# Patient Record
Sex: Female | Born: 1945 | Race: White | Hispanic: No | Marital: Married | Smoking: Never smoker
Health system: Southern US, Community
[De-identification: ages and names within clinical notes are randomized; demographics above are authoritative.]

## PROBLEM LIST (undated history)

## (undated) DIAGNOSIS — K5792 Diverticulitis of intestine, part unspecified, without perforation or abscess without bleeding: Secondary | ICD-10-CM

## (undated) DIAGNOSIS — I10 Essential (primary) hypertension: Secondary | ICD-10-CM

## (undated) DIAGNOSIS — B029 Zoster without complications: Secondary | ICD-10-CM

## (undated) DIAGNOSIS — M1990 Unspecified osteoarthritis, unspecified site: Secondary | ICD-10-CM

## (undated) DIAGNOSIS — H269 Unspecified cataract: Secondary | ICD-10-CM

## (undated) DIAGNOSIS — K5730 Diverticulosis of large intestine without perforation or abscess without bleeding: Secondary | ICD-10-CM

## (undated) DIAGNOSIS — E119 Type 2 diabetes mellitus without complications: Secondary | ICD-10-CM

## (undated) DIAGNOSIS — C801 Malignant (primary) neoplasm, unspecified: Secondary | ICD-10-CM

## (undated) DIAGNOSIS — E785 Hyperlipidemia, unspecified: Secondary | ICD-10-CM

## (undated) DIAGNOSIS — K219 Gastro-esophageal reflux disease without esophagitis: Secondary | ICD-10-CM

## (undated) HISTORY — PX: ABDOMINAL HYSTERECTOMY: SHX81

## (undated) HISTORY — DX: Unspecified osteoarthritis, unspecified site: M19.90

## (undated) HISTORY — DX: Gastro-esophageal reflux disease without esophagitis: K21.9

## (undated) HISTORY — DX: Diverticulosis of large intestine without perforation or abscess without bleeding: K57.30

## (undated) HISTORY — DX: Zoster without complications: B02.9

## (undated) HISTORY — DX: Essential (primary) hypertension: I10

## (undated) HISTORY — PX: POLYPECTOMY: SHX149

## (undated) HISTORY — DX: Type 2 diabetes mellitus without complications: E11.9

## (undated) HISTORY — DX: Unspecified cataract: H26.9

## (undated) HISTORY — PX: COLONOSCOPY: SHX174

## (undated) HISTORY — PX: HEMATOMA EVACUATION: SHX5118

## (undated) HISTORY — DX: Diverticulitis of intestine, part unspecified, without perforation or abscess without bleeding: K57.92

## (undated) HISTORY — DX: Hyperlipidemia, unspecified: E78.5

## (undated) HISTORY — PX: TUBAL LIGATION: SHX77

---

## 2001-03-31 ENCOUNTER — Encounter: Payer: Self-pay | Admitting: Emergency Medicine

## 2001-03-31 ENCOUNTER — Encounter (INDEPENDENT_AMBULATORY_CARE_PROVIDER_SITE_OTHER): Payer: Self-pay

## 2001-03-31 ENCOUNTER — Inpatient Hospital Stay (HOSPITAL_COMMUNITY): Admission: EM | Admit: 2001-03-31 | Discharge: 2001-04-07 | Payer: Self-pay | Admitting: Emergency Medicine

## 2001-04-04 ENCOUNTER — Encounter: Payer: Self-pay | Admitting: General Surgery

## 2001-04-10 ENCOUNTER — Emergency Department (HOSPITAL_COMMUNITY): Admission: EM | Admit: 2001-04-10 | Discharge: 2001-04-11 | Payer: Self-pay | Admitting: Emergency Medicine

## 2001-04-11 ENCOUNTER — Encounter: Payer: Self-pay | Admitting: General Surgery

## 2001-04-11 ENCOUNTER — Ambulatory Visit (HOSPITAL_COMMUNITY): Admission: RE | Admit: 2001-04-11 | Discharge: 2001-04-11 | Payer: Self-pay | Admitting: General Surgery

## 2004-09-15 ENCOUNTER — Ambulatory Visit: Payer: Self-pay | Admitting: Internal Medicine

## 2004-09-18 ENCOUNTER — Ambulatory Visit: Payer: Self-pay | Admitting: Internal Medicine

## 2004-09-27 ENCOUNTER — Ambulatory Visit (HOSPITAL_COMMUNITY): Admission: RE | Admit: 2004-09-27 | Discharge: 2004-09-27 | Payer: Self-pay | Admitting: Internal Medicine

## 2004-10-27 ENCOUNTER — Ambulatory Visit: Payer: Self-pay | Admitting: Internal Medicine

## 2004-10-30 ENCOUNTER — Ambulatory Visit: Payer: Self-pay | Admitting: Internal Medicine

## 2005-01-02 ENCOUNTER — Ambulatory Visit: Payer: Self-pay | Admitting: Internal Medicine

## 2005-05-01 ENCOUNTER — Ambulatory Visit: Payer: Self-pay | Admitting: Gastroenterology

## 2005-05-17 ENCOUNTER — Ambulatory Visit: Payer: Self-pay | Admitting: Internal Medicine

## 2005-05-22 ENCOUNTER — Ambulatory Visit: Payer: Self-pay | Admitting: Internal Medicine

## 2005-06-13 ENCOUNTER — Ambulatory Visit: Payer: Self-pay | Admitting: Gastroenterology

## 2005-07-09 ENCOUNTER — Ambulatory Visit: Payer: Self-pay | Admitting: Internal Medicine

## 2005-07-10 ENCOUNTER — Ambulatory Visit: Payer: Self-pay | Admitting: Internal Medicine

## 2005-07-12 ENCOUNTER — Ambulatory Visit: Payer: Self-pay | Admitting: Gastroenterology

## 2005-07-18 ENCOUNTER — Ambulatory Visit: Payer: Self-pay | Admitting: Internal Medicine

## 2006-04-10 ENCOUNTER — Ambulatory Visit: Payer: Self-pay | Admitting: Internal Medicine

## 2006-04-10 LAB — CONVERTED CEMR LAB
ALT: 17 units/L (ref 0–40)
Albumin: 3.9 g/dL (ref 3.5–5.2)
Alkaline Phosphatase: 47 units/L (ref 39–117)
BUN: 23 mg/dL (ref 6–23)
Basophils Relative: 3.4 % — ABNORMAL HIGH (ref 0.0–1.0)
Bilirubin Urine: NEGATIVE
CO2: 26 meq/L (ref 19–32)
Calcium: 9.3 mg/dL (ref 8.4–10.5)
Eosinophils Absolute: 0.2 10*3/uL (ref 0.0–0.6)
GFR calc Af Amer: 208 mL/min
GFR calc non Af Amer: 172 mL/min
LDL Cholesterol: 67 mg/dL (ref 0–99)
Lymphocytes Relative: 28.2 % (ref 12.0–46.0)
Monocytes Relative: 5.2 % (ref 3.0–11.0)
Neutro Abs: 4.4 10*3/uL (ref 1.4–7.7)
Platelets: 317 10*3/uL (ref 150–400)
Specific Gravity, Urine: 1.01 (ref 1.000–1.03)
Total CHOL/HDL Ratio: 3
Total Protein, Urine: NEGATIVE mg/dL
Triglycerides: 118 mg/dL (ref 0–149)
Urine Glucose: NEGATIVE mg/dL
VLDL: 24 mg/dL (ref 0–40)

## 2006-04-16 ENCOUNTER — Ambulatory Visit: Payer: Self-pay | Admitting: Internal Medicine

## 2007-06-25 ENCOUNTER — Emergency Department (HOSPITAL_COMMUNITY): Admission: EM | Admit: 2007-06-25 | Discharge: 2007-06-26 | Payer: Self-pay | Admitting: Emergency Medicine

## 2007-07-23 ENCOUNTER — Telehealth (INDEPENDENT_AMBULATORY_CARE_PROVIDER_SITE_OTHER): Payer: Self-pay | Admitting: *Deleted

## 2007-07-30 ENCOUNTER — Ambulatory Visit (HOSPITAL_COMMUNITY): Admission: RE | Admit: 2007-07-30 | Discharge: 2007-07-30 | Payer: Self-pay | Admitting: Internal Medicine

## 2007-09-05 ENCOUNTER — Ambulatory Visit: Payer: Self-pay | Admitting: Internal Medicine

## 2007-09-05 LAB — CONVERTED CEMR LAB
ALT: 20 units/L (ref 0–35)
Basophils Absolute: 0.1 10*3/uL (ref 0.0–0.1)
Bilirubin Urine: NEGATIVE
Bilirubin, Direct: 0.2 mg/dL (ref 0.0–0.3)
CO2: 26 meq/L (ref 19–32)
Calcium: 9.4 mg/dL (ref 8.4–10.5)
Creatinine, Ser: 0.9 mg/dL (ref 0.4–1.2)
Eosinophils Absolute: 0.2 10*3/uL (ref 0.0–0.7)
GFR calc Af Amer: 81 mL/min
GFR calc non Af Amer: 67 mL/min
HDL: 42.4 mg/dL (ref >39.0)
Ketones, ur: NEGATIVE mg/dL
LDL Cholesterol: 67 mg/dL (ref 0–99)
Lymphocytes Relative: 27 % (ref 12.0–46.0)
MCHC: 35.4 g/dL (ref 30.0–36.0)
MCV: 90.7 fL (ref 78.0–100.0)
Neutro Abs: 5.4 10*3/uL (ref 1.4–7.7)
Neutrophils Relative %: 61.8 % (ref 43.0–77.0)
RDW: 12.2 % (ref 11.5–14.6)
Rh Type: POSITIVE
Sodium: 139 meq/L (ref 135–145)
TSH: 3.68 microintl units/mL (ref 0.35–5.50)
Total Bilirubin: 1.4 mg/dL — ABNORMAL HIGH (ref 0.3–1.2)
Total CHOL/HDL Ratio: 3.3
Triglycerides: 152 mg/dL — ABNORMAL HIGH (ref 0–149)
Urobilinogen, UA: 0.2 (ref 0.0–1.0)
VLDL: 30 mg/dL (ref 0–40)

## 2007-09-10 ENCOUNTER — Ambulatory Visit: Payer: Self-pay | Admitting: Internal Medicine

## 2007-09-10 DIAGNOSIS — E785 Hyperlipidemia, unspecified: Secondary | ICD-10-CM

## 2007-09-10 DIAGNOSIS — E739 Lactose intolerance, unspecified: Secondary | ICD-10-CM | POA: Insufficient documentation

## 2007-09-10 DIAGNOSIS — I10 Essential (primary) hypertension: Secondary | ICD-10-CM | POA: Insufficient documentation

## 2007-09-10 DIAGNOSIS — K5730 Diverticulosis of large intestine without perforation or abscess without bleeding: Secondary | ICD-10-CM

## 2007-09-10 DIAGNOSIS — R040 Epistaxis: Secondary | ICD-10-CM | POA: Insufficient documentation

## 2007-09-10 HISTORY — DX: Diverticulosis of large intestine without perforation or abscess without bleeding: K57.30

## 2007-09-10 HISTORY — DX: Essential (primary) hypertension: I10

## 2007-09-10 HISTORY — DX: Hyperlipidemia, unspecified: E78.5

## 2008-03-26 ENCOUNTER — Telehealth (INDEPENDENT_AMBULATORY_CARE_PROVIDER_SITE_OTHER): Payer: Self-pay | Admitting: *Deleted

## 2008-03-26 DIAGNOSIS — E119 Type 2 diabetes mellitus without complications: Secondary | ICD-10-CM | POA: Insufficient documentation

## 2008-03-26 HISTORY — DX: Type 2 diabetes mellitus without complications: E11.9

## 2008-04-02 ENCOUNTER — Telehealth (INDEPENDENT_AMBULATORY_CARE_PROVIDER_SITE_OTHER): Payer: Self-pay | Admitting: *Deleted

## 2008-04-09 ENCOUNTER — Ambulatory Visit: Payer: Self-pay | Admitting: Internal Medicine

## 2008-04-09 LAB — CONVERTED CEMR LAB
BUN: 26 mg/dL — ABNORMAL HIGH (ref 6–23)
CO2: 28 meq/L (ref 19–32)
Chloride: 106 meq/L (ref 96–112)
Cholesterol: 122 mg/dL (ref 0–200)
GFR calc Af Amer: 93 mL/min
Glucose, Bld: 114 mg/dL — ABNORMAL HIGH (ref 70–99)
HDL: 33 mg/dL — ABNORMAL LOW (ref >39.0)
Potassium: 4.9 meq/L (ref 3.5–5.1)
Sodium: 141 meq/L (ref 135–145)
Triglycerides: 100 mg/dL (ref 0–149)
aPTT: 25 s (ref 21.7–29.8)

## 2008-04-12 ENCOUNTER — Ambulatory Visit: Payer: Self-pay | Admitting: Endocrinology

## 2008-04-12 DIAGNOSIS — R05 Cough: Secondary | ICD-10-CM

## 2008-04-12 DIAGNOSIS — R059 Cough, unspecified: Secondary | ICD-10-CM | POA: Insufficient documentation

## 2008-05-18 ENCOUNTER — Encounter: Admission: RE | Admit: 2008-05-18 | Discharge: 2008-05-18 | Payer: Self-pay | Admitting: Endocrinology

## 2008-05-18 ENCOUNTER — Encounter: Payer: Self-pay | Admitting: Endocrinology

## 2008-10-27 ENCOUNTER — Ambulatory Visit (HOSPITAL_COMMUNITY): Admission: RE | Admit: 2008-10-27 | Discharge: 2008-10-27 | Payer: Self-pay | Admitting: Internal Medicine

## 2008-11-26 ENCOUNTER — Ambulatory Visit: Payer: Self-pay | Admitting: Internal Medicine

## 2008-11-26 LAB — CONVERTED CEMR LAB
AST: 26 units/L (ref 0–37)
Albumin: 4 g/dL (ref 3.5–5.2)
Alkaline Phosphatase: 48 units/L (ref 39–117)
Basophils Relative: 0.3 % (ref 0.0–3.0)
CO2: 27 meq/L (ref 19–32)
Calcium: 9.1 mg/dL (ref 8.4–10.5)
Eosinophils Absolute: 0.2 10*3/uL (ref 0.0–0.7)
Glucose, Bld: 124 mg/dL — ABNORMAL HIGH (ref 70–99)
HCT: 39.9 % (ref 36.0–46.0)
Hemoglobin: 13.7 g/dL (ref 12.0–15.0)
Lymphocytes Relative: 31.1 % (ref 12.0–46.0)
Lymphs Abs: 2.1 10*3/uL (ref 0.7–4.0)
MCHC: 34.4 g/dL (ref 30.0–36.0)
Monocytes Relative: 8.9 % (ref 3.0–12.0)
Neutro Abs: 3.8 10*3/uL (ref 1.4–7.7)
Nitrite: NEGATIVE
Potassium: 4.6 meq/L (ref 3.5–5.1)
RBC: 4.24 M/uL (ref 3.87–5.11)
RDW: 11.9 % (ref 11.5–14.6)
Sodium: 140 meq/L (ref 135–145)
Specific Gravity, Urine: 1.015 (ref 1.000–1.030)
TSH: 2.74 microintl units/mL (ref 0.35–5.50)
Total CHOL/HDL Ratio: 4
Total Protein, Urine: NEGATIVE mg/dL
Total Protein: 6.6 g/dL (ref 6.0–8.3)
pH: 6 (ref 5.0–8.0)

## 2008-11-29 LAB — CONVERTED CEMR LAB: Hgb A1c MFr Bld: 5.9 % (ref 4.6–6.5)

## 2008-12-02 ENCOUNTER — Ambulatory Visit: Payer: Self-pay | Admitting: Internal Medicine

## 2008-12-02 DIAGNOSIS — N959 Unspecified menopausal and perimenopausal disorder: Secondary | ICD-10-CM | POA: Insufficient documentation

## 2009-06-09 ENCOUNTER — Telehealth (INDEPENDENT_AMBULATORY_CARE_PROVIDER_SITE_OTHER): Payer: Self-pay | Admitting: *Deleted

## 2009-06-14 ENCOUNTER — Telehealth: Payer: Self-pay | Admitting: Internal Medicine

## 2009-06-20 ENCOUNTER — Telehealth (INDEPENDENT_AMBULATORY_CARE_PROVIDER_SITE_OTHER): Payer: Self-pay | Admitting: *Deleted

## 2009-07-19 ENCOUNTER — Telehealth (INDEPENDENT_AMBULATORY_CARE_PROVIDER_SITE_OTHER): Payer: Self-pay | Admitting: *Deleted

## 2009-09-02 ENCOUNTER — Telehealth (INDEPENDENT_AMBULATORY_CARE_PROVIDER_SITE_OTHER): Payer: Self-pay | Admitting: *Deleted

## 2010-02-02 ENCOUNTER — Ambulatory Visit: Payer: Self-pay | Admitting: Internal Medicine

## 2010-02-02 DIAGNOSIS — E559 Vitamin D deficiency, unspecified: Secondary | ICD-10-CM

## 2010-02-07 ENCOUNTER — Ambulatory Visit (HOSPITAL_COMMUNITY)
Admission: RE | Admit: 2010-02-07 | Discharge: 2010-02-07 | Payer: Self-pay | Source: Home / Self Care | Attending: Internal Medicine | Admitting: Internal Medicine

## 2010-02-07 LAB — CONVERTED CEMR LAB
ALT: 32 units/L (ref 0–35)
BUN: 24 mg/dL — ABNORMAL HIGH (ref 6–23)
Basophils Absolute: 0.1 10*3/uL (ref 0.0–0.1)
Bilirubin Urine: NEGATIVE
Bilirubin, Direct: 0.2 mg/dL (ref 0.0–0.3)
Cholesterol: 244 mg/dL — ABNORMAL HIGH (ref 0–200)
Creatinine, Ser: 0.7 mg/dL (ref 0.4–1.2)
Direct LDL: 164.5 mg/dL
Eosinophils Relative: 2.4 % (ref 0.0–5.0)
GFR calc non Af Amer: 93.54 mL/min (ref >60.00)
Glucose, Bld: 124 mg/dL — ABNORMAL HIGH (ref 70–99)
HDL: 45.1 mg/dL (ref >39.00)
MCV: 91.9 fL (ref 78.0–100.0)
Monocytes Absolute: 0.5 10*3/uL (ref 0.1–1.0)
Monocytes Relative: 6.6 % (ref 3.0–12.0)
Neutrophils Relative %: 58.6 % (ref 43.0–77.0)
Nitrite: NEGATIVE
Platelets: 284 10*3/uL (ref 150.0–400.0)
RDW: 12.9 % (ref 11.5–14.6)
Specific Gravity, Urine: 1.01 (ref 1.000–1.030)
TSH: 3.54 microintl units/mL (ref 0.35–5.50)
Total Bilirubin: 1.3 mg/dL — ABNORMAL HIGH (ref 0.3–1.2)
Total Protein, Urine: NEGATIVE mg/dL
VLDL: 44.4 mg/dL — ABNORMAL HIGH (ref 0.0–40.0)
WBC: 8.2 10*3/uL (ref 4.5–10.5)
pH: 6 (ref 5.0–8.0)

## 2010-02-09 ENCOUNTER — Ambulatory Visit
Admission: RE | Admit: 2010-02-09 | Discharge: 2010-02-09 | Payer: Self-pay | Source: Home / Self Care | Attending: Internal Medicine | Admitting: Internal Medicine

## 2010-02-09 ENCOUNTER — Encounter: Payer: Self-pay | Admitting: Internal Medicine

## 2010-02-09 DIAGNOSIS — D751 Secondary polycythemia: Secondary | ICD-10-CM | POA: Insufficient documentation

## 2010-02-20 ENCOUNTER — Ambulatory Visit: Admit: 2010-02-20 | Payer: Self-pay | Attending: Internal Medicine | Admitting: Internal Medicine

## 2010-02-26 ENCOUNTER — Encounter: Payer: Self-pay | Admitting: Internal Medicine

## 2010-03-07 NOTE — Progress Notes (Signed)
  Phone Note Other Incoming   Request: Send information Summary of Call: Request for records received from [facility]. Request forwarded to [facility].     

## 2010-03-07 NOTE — Progress Notes (Signed)
  Phone Note Other Incoming   Request: Send information Summary of Call: Request for records received from [facility]. Request forwarded to [facility].     

## 2010-03-07 NOTE — Progress Notes (Signed)
Summary: METFORMIN  Phone Note Call from Patient Call back at [phone] OK VM    Summary of Call: At last office visit pt's metformin was changed from 2 daily to once daily. She has been taking 1 once daily and fasting cbgs are beginning to be 140's. She would like to resume metformin 500mg  1 two times a day. If ok please sent new rx into [facility] [address].  Initial call taken by: Lamar Sprinkles, CMA,  Jun 14, 2009 11:45 AM  Follow-up for Phone Call        ok with me - to robin to handle Follow-up by: Corwin Levins MD,  Jun 14, 2009 1:09 PM    New/Updated Medications: METFORMIN HCL 500 MG TABS (METFORMIN HCL) 1 by mouth two times a day Prescriptions: METFORMIN HCL 500 MG TABS (METFORMIN HCL) 1 by mouth two times a day  #60 x 6   Entered by:   Scharlene Gloss   Authorized by:   Corwin Levins MD   Signed by:   Scharlene Gloss on 06/14/2009   Method used:   Faxed to ...       [facility]  [address]  [phone]* (retail)       [address]       [place], [place]  [address]       Ph: [phone] or [phone]       Fax: [phone]   RxID:   [id number]

## 2010-03-07 NOTE — Progress Notes (Signed)
  Phone Note Other Incoming   Request: Send information Summary of Call: Request for records received from [facility]. Request forwarded to [facility].

## 2010-03-07 NOTE — Progress Notes (Signed)
  Phone Note Other Incoming   Request: Send information Summary of Call: Request received from [facility] forwarded to [facility].        

## 2010-03-09 NOTE — Assessment & Plan Note (Signed)
Summary: Cpx/#/[employer]/cd   Vital Signs:  Patient profile:   65 year old female Height:      67 inches (170.18 cm) Weight:      214.0 pounds (97.27 kg) BMI:     33.64 O2 Sat:      97 % on Room air Temp:     99.0 degrees F (37.22 degrees C) oral Pulse rate:   72 / minute BP sitting:   132 / 88  (left arm) Cuff size:   large  Vitals Entered By: Orlan Leavens RMA (February 09, 2010 2:07 PM)  O2 Flow:  Room air  Preventive Care Screening  Mammogram:    Date:  02/07/2010    Next Due:  02/2011    Results:  normal   Last Flu Shot:    Date:  12/06/2009    Results:  Historical   CC: CPX Is Patient Diabetic? No Pain Assessment Patient in pain? no      Comments Pt stop taking simvastatin a month ago, she states want to discuss giving her muscle cramps   Primary Care Provider:  Corwin Levins MD  CC:  CPX.  History of Present Illness: here for wellness, overall doing ok, Pt denies CP, worsening sob, doe, wheezing, orthopnea, pnd, worsening LE edema, palps, dizziness or syncope  Pt denies new neuro symptoms such as headache, facial or extremity weakness  Pt denies polydipsia, polyuria, or low sugar symptoms such as shakiness improved with eating.  Overall good compliance with meds, trying to follow low chol, DM diet, wt stable, little excercise however  CBG's in the 100's.  Denies worsening depressive symptoms, suicidal ideation, or panic.   No fever, wt loss, night sweats, loss of appetite or other constitutional symptoms  Overall good compliance with meds, and good tolerability.  Pt states good ability with ADL's, low fall risk, home safety reviewed and adequate, no significant change in hearing or vision, trying to follow lower chol diet, and occasionally active only with regular excercise. Did mention she stopped the simvastatin de to hip and knee pain approx 6 wks ago, but now thinks it may not have been due to the statin.  Now starting to go to the gym  - just started , no wt loss yet.     Preventive Screening-Counseling & Management      Drug Use:  no.    Problems Prior to Update: 1)  Polycythemia  (ICD-289.0) 2)  Menopausal Disorder  (ICD-627.9) 3)  Cough Due To Ace Inhibitors  (ICD-786.2) 4)  Dm  (ICD-250.00) 5)  Epistaxis, Recurrent  (ICD-784.7) 6)  Glucose Intolerance  (ICD-271.3) 7)  Hypertension  (ICD-401.9) 8)  Hyperlipidemia  (ICD-272.4) 9)  Diverticulosis, Colon  (ICD-562.10) 10)  Preventive Health Care  (ICD-V70.0)  Medications Prior to Update: 1)  Losartan Potassium 100 Mg Tabs (Losartan Potassium) .[name].. 1 By Mouth Once Daily 2)  Metformin Hcl 500 Mg Tabs (Metformin Hcl) .... Take One Tablet By Mouth Qam- 3)  Simvastatin 40 Mg Tabs (Simvastatin) .[name].. 1 By Mouth Once Daily 4)  Metformin Hcl 500 Mg Tabs (Metformin Hcl) .[name].. 1 By Mouth Two Times A Day  Current Medications (verified): 1)  Losartan Potassium 100 Mg Tabs (Losartan Potassium) .[name].. 1 By Mouth Once Daily 2)  Metformin Hcl 500 Mg Tabs (Metformin Hcl) .... Take One Tablet By Mouth Qam- 3)  Simvastatin 40 Mg Tabs (Simvastatin) .... 1/2 Po Once Daily 4)  Fish Oil 1000 Mg Caps (Omega-3 Fatty Acids) .... Take 3 By  Mouth Once Daily 5)  Multivitamins  Tabs (Multiple Vitamin) .... Take 1 By Mouth Once Daily 6)  Glucosamine-Chondroitin 500-400 Mg Caps (Glucosamine-Chondroitin) .... Take 1 By Mouth Once Daily  Allergies (verified): 1)  ! * Ivp Dye 2)  Sulfamethoxazole (Sulfamethoxazole)  Past History:  Past Medical History: Last updated: 09/10/2007 Diverticulosis, colon Hyperlipidemia Hypertension glucose intolerance hx of shingles glaucoma  Past Surgical History: Last updated: 09/10/2007 Hysterectomy Tubal ligation s/p laparotomy 2003 with removal paraduodenal hematoma  Social History: Last updated: 02/09/2010 Married 2 daughters work - Paediatric nurse medicare Never Smoked Alcohol use-yes Drug use-no  Risk Factors: Smoking Status: never (09/10/2007)  Social History: Married 2  daughters work - Chief Technology Officer Never Smoked Alcohol use-yes Drug use-no Drug Use:  no  Review of Systems  The patient denies anorexia, fever, vision loss, decreased hearing, hoarseness, chest pain, syncope, dyspnea on exertion, peripheral edema, prolonged cough, headaches, hemoptysis, abdominal pain, melena, hematochezia, severe indigestion/heartburn, hematuria, muscle weakness, suspicious skin lesions, transient blindness, difficulty walking, depression, unusual weight change, abnormal bleeding, enlarged lymph nodes, and angioedema.         all otherwise negative per pt -    Physical Exam  General:  alert and overweight-appearing.   Head:  normocephalic and atraumatic.   Eyes:  vision grossly intact, pupils equal, and pupils round.   Ears:  R ear normal and L ear normal.   Nose:  no external deformity and no nasal discharge.   Mouth:  no gingival abnormalities and pharynx pink and moist.   Neck:  supple and no masses.   Lungs:  normal respiratory effort and normal breath sounds.   Heart:  normal rate and regular rhythm.   Abdomen:  soft, non-tender, and normal bowel sounds.   Msk:  no joint tenderness and no joint swelling.   Extremities:  no edema, no erythema  Neurologic:  cranial nerves II-XII intact and strength normal in all extremities.   Skin:  color normal and no rashes.   Psych:  not depressed appearing and slightly anxious.     Impression & Recommendations:  Problem # 1:  Preventive Health Care (ICD-V70.0) Overall doing well, age appropriate education and counseling updated, referral for preventive services and immunizations addressed, dietary counseling and smoking status adressed , most recent labs reviewed, ecg reviewed I have personally reviewed and have noted 1.The patient's medical and social history 2.Their use of alcohol, tobacco or illicit drugs 3.Their current medications and supplements 4. Functional ability including ADL's, fall risk, home safety risk,  hearing & visual impairment  5.Diet and physical activities 6.Evidence for depression or mood disorders The patients weight, height, BMI  have been recorded in the chart I have made referrals, counseling and provided education to the patient based review of the above  Orders: EKG w/ Interpretation (93000)  Problem # 2:  DM (ICD-250.00)  The following medications were removed from the medication list:    Metformin Hcl 500 Mg Tabs (Metformin hcl) .[name]... 1 by mouth two times a day Her updated medication list for this problem includes:    Losartan Potassium 100 Mg Tabs (Losartan potassium) .[name]... 1 by mouth once daily    Metformin Hcl 500 Mg Tabs (Metformin hcl) .[name]... Take one tablet by mouth qam-  Labs Reviewed: Creat: 0.7 (02/07/2010)    Reviewed HgBA1c results: 5.9 (11/26/2008)  5.9 (04/09/2008) stable overall by hx and exam, ok to continue meds/tx as is   Problem # 3:  HYPERTENSION (ICD-401.9)  Her updated medication list for this  problem includes:    Losartan Potassium 100 Mg Tabs (Losartan potassium) .[name]... 1 by mouth once daily  BP today: 132/88 Prior BP: 102/70 (12/02/2008)  Labs Reviewed: K+: 4.9 (02/07/2010) Creat: : 0.7 (02/07/2010)   Chol: 244 (02/07/2010)   HDL: 45.10 (02/07/2010)   LDL: 89 (11/26/2008)   TG: 222.0 (02/07/2010) stable overall by hx and exam, ok to continue meds/tx as is   Problem # 4:  HYPERLIPIDEMIA (ICD-272.4)  Her updated medication list for this problem includes:    Simvastatin 40 Mg Tabs (Simvastatin) .[name]... 1/2 po once daily to restart meds - f/u labs in 6 mo  Labs Reviewed: SGOT: 33 (02/07/2010)   SGPT: 32 (02/07/2010)   HDL:45.10 (02/07/2010), 38.20 (11/26/2008)  LDL:89 (11/26/2008), 69 (04/09/2008)  Chol:244 (02/07/2010), 147 (11/26/2008)  Trig:222.0 (02/07/2010), 100.0 (11/26/2008)  Problem # 5:  POLYCYTHEMIA (ICD-289.0) ? lab artifact - to repeat cbc with next labs  Complete Medication List: 1)  Losartan Potassium 100 Mg Tabs (Losartan  potassium) .[name].. 1 by mouth once daily 2)  Metformin Hcl 500 Mg Tabs (Metformin hcl) .... Take one tablet by mouth qam- 3)  Simvastatin 40 Mg Tabs (Simvastatin) .... 1/2 po once daily 4)  Fish Oil 1000 Mg Caps (Omega-3 fatty acids) .... Take 3 by mouth once daily 5)  Multivitamins Tabs (Multiple vitamin) .... Take 1 by mouth once daily 6)  Glucosamine-chondroitin 500-400 Mg Caps (Glucosamine-chondroitin) .... Take 1 by mouth once daily  Other Orders: T-Bone Densitometry [id number])  Patient Instructions: 1)  please return for LAB only in 6 months  2)  BMP prior to visit, ICD-9: 250.02 3)  HbgA1C prior to visit, ICD-9: 4)  Lipid Panel prior to visit, ICD-9: 5)  Hepatic Panel prior to visit, ICD-9: v58.69 6)  CBC w/ Diff prior to visit, ICD-9: 7)  please schedule the bone density test before leaving today 8)  Please schedule a follow-up appointment in 1 year for CPX with labs Prescriptions: METFORMIN HCL 500 MG TABS (METFORMIN HCL) Take one tablet by mouth qam-  #90 x 3   Entered and Authorized by:   Corwin Levins MD   Signed by:   Corwin Levins MD on 02/09/2010   Method used:   Electronically to        [facility]  [phone]* (retail)       [address]       [place], [place]  [address]       Ph: [phone] or [phone]       Fax: [phone]   RxID:   [id number] LOSARTAN POTASSIUM 100 MG TABS (LOSARTAN POTASSIUM) 1 by mouth once daily  #90 x 3   Entered and Authorized by:   Corwin Levins MD   Signed by:   Corwin Levins MD on 02/09/2010   Method used:   Electronically to        [facility]  [phone]* (retail)       [address]       [place], [place]  [address]       Ph: [phone] or [phone]       Fax: [phone]   RxID:   [id number] SIMVASTATIN 40 MG TABS (SIMVASTATIN) 1po once daily  #90 x 3   Entered and Authorized by:   Corwin Levins MD   Signed by:   Corwin Levins MD on 02/09/2010  Method used:   Electronically to        [facility]  [phone]* (retail)       [address]       [place], [place]  [address]       Ph: [phone] or [phone]       Fax: [phone]   RxID:   [id number]    Orders Added: 1)  EKG w/ Interpretation [93000] 2)  T-Bone Densitometry [77080]   Immunization History:  Influenza Immunization History:    Influenza:  historical (12/06/2009)   Immunization History:  Influenza Immunization History:    Influenza:  Historical (12/06/2009)

## 2010-04-25 ENCOUNTER — Telehealth: Payer: Self-pay

## 2010-04-25 NOTE — Telephone Encounter (Signed)
Ok for metformin 500 bid - to robin to handle  Last cpx was Feb 09, 2010

## 2010-04-25 NOTE — Telephone Encounter (Signed)
Pt called stating that she has been taking Metformin bid instead of qd since January 2012 due to increased A1C. Pt says that she is now out of medications and cannot refill. Pt is requesting new Rx for Metformin bid to pharmacy, please advise.

## 2010-04-26 MED ORDER — METFORMIN HCL 500 MG PO TABS: 500.0000 mg | ORAL_TABLET | Freq: Two times a day (BID) | ORAL | Status: DC

## 2010-04-26 NOTE — Telephone Encounter (Signed)
Called patient informed prescription has been changed and sent to [facility] as requested

## 2010-04-26 NOTE — Telephone Encounter (Signed)
Addended by: Scharlene Gloss on: 04/26/2010 02:09 PM   Modules accepted: Orders

## 2010-06-23 NOTE — Op Note (Signed)
[facility]  Patient:    [name] [name], [name] [name] Visit Number: [id number] MRN: [id number]          Service Type: SUR Location: 4W [id number] [id number] Attending Physician:  Henrene Dodge Dictated by:   Sandria Bales. Ezzard Standing, M.D. Proc. Date: 03/31/01 Admit Date:  03/31/2001   CC:         Anselm Pancoast. Zachery Dakins, M.D.   Operative Report  DATE OF BIRTH:  1946-02-02  PREOPERATIVE DIAGNOSES:  Retroperitoneal/right transverse colon mesentery hematoma.  POSTOPERATIVE DIAGNOSES:  Petechial injuries to proximal stomach, normal duodenum, no source of intraluminal bleeding.  PROCEDURE:  Esophagogastroduodenoscopy.  SURGEON:  Sandria Bales. Ezzard Standing, M.D.  FIRST ASSISTANT:  None.  ANESTHESIA:  General endotracheal.  ESTIMATED BLOOD LOSS:  Minimal.  INDICATIONS FOR PROCEDURE:  [name] is a 65 year old white female who has undergone abdominal exploration by Dr. Thornton Dales for a large hematoma involving her right retroperitoneum and right transverse colon mesentery. There has been no specific source of this bleeding. There is a question of whether there is some duodenal injury. For this reason, we did an intraoperative endoscopy.  DESCRIPTION OF PROCEDURE:  A flexible Olympus endoscope was passed without difficulty down the esophagus into the stomach into the duodenum. I advanced the scope around to the fourth portion of the duodenum and saw no mucosal injury, bleeding, or perforation in the duodenum. The scope was withdrawn into the stomach. The patient had several petechial hemorrhages in the proximal aspect of the stomach which I think was due to the NG tube. Otherwise, her stomach lining was unremarkable. Her gastroesophageal junction was at approximately 38 cm. The esophagus was itself was unremarkable.  The patient tolerated the procedure well, again there was no source of bleeding or perforation on the endoscopy though she did have the  petechial hemorrhages on the NG tube. Dr. Zachery Dakins will dictate the operative note involving the abdominal laparotomy. Dictated by:   Sandria Bales. Ezzard Standing, M.D. Attending Physician:  Henrene Dodge DD:  03/31/01 TD:  03/31/01 Job: [id number]

## 2010-06-23 NOTE — Op Note (Signed)
[facility]  Patient:    [name], [name] Visit Number: [id number] MRN: [id number]          Service Type: SUR Location: 4W 0456 01 Attending Physician:  Henrene Dodge Dictated by:   Anselm Pancoast. Zachery Dakins, M.D. Proc. Date: 03/31/01 Admit Date:  03/31/2001                             Operative Report  PREOPERATIVE DIAGNOSIS:  Paraduodenal hematoma etiology questionable.  POSTOPERATIVE DIAGNOSIS:  Paraduodenal hematoma etiology questionable.  OPERATION PERFORMED:  Exploratory laparotomy and evacuation of large paraduodenal hematoma, upper endoscopy by Dr. Ovidio Kin.  SURGEON:  Anselm Pancoast. Zachery Dakins, M.D.  ASSISTANT:  Sandria Bales. Ezzard Standing, M.D.  HISTORY OF PRESENT ILLNESS:  [name] [name] is a 65 year old mildly over weight female who was brought to the emergency room this morning by her husband per car after she had the onset of moderately severe upper abdominal pain last evening. The pain basically subsided somewhat approximately 30 minutes later and she went to bed and she was kind of intermittently uncomfortable during the night but did not have the intense pain. This morning she got up and had a small amount of water to drink and had the onset of pain again and then this kind of persisted and she was brought by car to the emergency room. While she was actually signing in, she nearly passed out and had an orthostatic drop in blood pressure with the pressure documented at about 60 systolic. An IV was started and labs were drawn and her hematocrit was 32 and then for evaluation possibly for a ruptured aneurysm she was taken over to CT, given about two cups of oral contrast and placed in the CT machine. CT of the abdomen showed a large hematoma around the duodenum and there was some question of whether she had a little extravasation of the duodenal contrast and I was asked to see the patient at about 10 minutes to 12. At that time, she was  mildly tender in the upper abdomen and was certainly hemodynamically stable and not that generalized peritonitis or generalized abdominal tenderness. I discussed with the patient and her husband that we had elicited no evidence of any trauma to the upper abdomen. She does take aspirin, Indocin or Ecotrin for back discomfort two or three times a day but has not had previous episodes of similar pain and she has not had any documented hypertension or cardiac problems. I reviewed the CT with the radiologist and in my opinion this was a large hematoma that looked like it possibly came from the mesentery going to the hepatic flexure of the colon or possibly the paraduodenal area and because of the large size of the hematoma recommended that we plan on exploring her. 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 With this we gave her 3 gm of Unasyn and permission obtained for exploratory laparotomy. Dr. Ezzard Standing will assist.  DESCRIPTION OF PROCEDURE:  The patient was taken to the operative suite, induction of general anesthesia, endotracheal tube, oral tube into the stomach and then the Foley catheter was inserted sterilely and the abdomen was prepped with Betadine surgical scrub and solution and draped in a sterile manner. About  a 4 or 5 inch epigastric incision was made above the umbilicus and dissected down through the adipose tissue in the linea alba and then opened into the peritoneal cavity. There was no free blood in the peritoneal cavity but there was a large hematoma up behind the hepatic flexure of the colon. There was nothing going up into the mesentery of the liver and I did not appreciate any gallstones. The peritoneum was opened around the hepatic flexure so we could kind of manipulate  this. On looking at the route going to the superior mesenteric vessel, there was hematoma kind of tracking back towards the ______ but the main source in the hematoma appeared to be kind of behind the colon and lateral to the duodenum. In opening to that, we sort of scooped out and we later measured the clot at about 15 ounces of frank clot and then sucked the numerous small areas and sort of free this up as far as there was nothing that was really arterial bleeding. There were numerous little vessels of course coming off from the third portion of the duodenum and also the branch was going to the right colic that possibly could have been the source of bleeding but we could not actually just identify a definite bleeding site. In opening up into the hematoma anything that looked like a blood vessel of course was ligated with 2-0 Vicryl and then we meticulously evacuated the remaining clots and sort of looked at the area and did not truly go behind the head of the pancreas since there was no evidence of any large hematoma there on the CT or clinically appearing. At this point, we had no evidence of any further bleeding. I had Dr. Ezzard Standing do an upper endoscopy while manipulating the scope through the stomach into the duodenum and through the third and fourth portions of the duodenum and there was no evidence of any ulceration or irritation of the duodenum. There were three or four areas where the NG tube which had been placed for the induction of anesthesia had kind of caused hemorrhagic areas of the stomach obviously from the suction and definitely the suction had not been in high suction. We then removed the upper endoscopy, evacuated the inflated air and then replaced the nasogastric tube through the nose. I then thoroughly inspected the area again, correct sponge count and then elected to go ahead and put a 19 Blake drain up into the area where the hematoma was and elected to close at this  point. There would have been a little more reassuring if we had actually found a definite arterial bleeding but we did not and we will be watching drainage if there is any acute  drainage. The small bowel was placed back in its anatomical position, a little peritoneum where we actually kind of made a little opening through the leaf of the mesentery was closed with a figure-of-eight of 3-0 silk. We carefully inspected the hepatic flexure of the colon and could not find any evidence of any ischemia where this little vessel had been ligated and the areas of the hemorrhagic area that had been debrided will be sent for pathology exam. We could not find anything actually in the head of the pancreas, this portion of the uncinate portion, etc. so she had bled from that and I really think that this was one of the branches off of the hepatic flexure of the colon was the actual source of the bleeding. Her stools had been  Hemoccult negative in the ER and her postoperative hematocrit was 34 the same basically as preoperatively. The patient will be kept on IV Pepcid n.p.o. and we will hopefully be able to remove the Foley catheter in the a.m. and then just kind of following her hemoglobin and hematocrit. I would certainly advise that she not take any of the aspirin or aspirin like products at least for the next three or four weeks and we will ask her again and make sure there is no history of any type of blunt trauma to this area. Her PT, PTT and platelet counts were all normal preoperatively. The fascia was closed with a running #1 PDS of interspersed #0 Prolene popoffs and the skin was closed with staples and a sterile occlusive dressing applied and the Blake drain had been sutured to the skin with a 3-0 silk. Dictated by:   Anselm Pancoast. Zachery Dakins, M.D. Attending Physician:  Henrene Dodge DD:  04/01/01 TD:  04/01/01 Job: [id number]

## 2010-06-23 NOTE — Discharge Summary (Signed)
[facility]  Patient:    [name] [name], [name] [name] Visit Number: [id number] MRN: [id number]          Service Type: SUR Location: [id number] Attending Physician:  Henrene Dodge Dictated by:   Anselm Pancoast. Zachery Dakins, M.D. Admit Date:  03/31/2001 Discharge Date: 04/07/2001                             Discharge Summary  DISCHARGE DIAGNOSES: 1. Intra-abdominal retroperitoneal bleeding, etiology not determined. 2. Paraduodenal mesentery of right colon.  OPERATION:  Exploratory laparotomy for evacuation of hematoma, control of bleeding.  HISTORY OF PRESENT ILLNESS:  [name] [name] is a 65 year old slightly overweight female who was admitted through the emergency room with the following symptoms.  She said the night prior to presenting to the ER she had sort of pain, sort of mild to severe right upper abdomen, felt like she was going to get sick.  Pain lasted 30 minutes or so, and then she went to bed. Was kind of uncomfortable during the night, but was certainly not having the acute episodes of pain.  The following morning she awoke and got up, but the pain became more intense, was similar in nature, and she felt as if she was going to pass out.  Her husband brought her by car to the ER at [facility], and as she was checking in, sitting up, she became really hypertensive and nearly passed out.  Her blood pressure was approximately 60, and at that time she was put on the gurney and taken back to the actual examination area, and thought possibly by the ER physician to maybe have an intra-abdominal bleed like a ruptured aneurysm etc.  ALLERGIES:  IV CONTRAST.  Had sort of an anaphylactic type reaction with and IVP years ago.  CURRENT MEDICATIONS:  None.  PAST MEDICAL HISTORY:  Has no definite history of trauma that we can determine.  HOSPITAL COURSE:  The IV was started, fluid boluses were given.  Her blood pressure was at 100+ range, and she was  taken over to CT.  The radiologist thought because of her age and the unlikely nature of ruptured aneurysm that some oral contrast, she had two cupfuls, but then promptly proceeded on to the CT scanner.  The CT scanner showed a large mass in the paraduodenal area consistent with a large hematoma.  It is not up in the hilum of the peri-aortic area, but it is to the right of the duodenum, sort of behind the colon.  I was asked to see the patient.  This was about 11:00, and at that time she was mildly tender in the area, and of course with the IV contrast allergies, there had been no IV contrast given for the CT.  I discussed with the patient, and her blood count was about 26, and of course this was before any IVs had been started, and I thought that the better thing to do would be to just go ahead and explore her, even though this was a mesenteric artery little aneurysm, or trauma, or exactly what we were not sure.  Her platelet count, PT, and PTT were normal.  The CT had been read by the radiologist to possibly show a little extravasation of the dye into the duodenal area, and they wondered if possibly this could have been an ulcer that had bled, basically externally.  Her stools were Hemoccult negative.  We proceeded.  Dr.  Newman assisted, and of course this was a large hematoma.  There was a liter of just pure clots, and then the other thinner stuff had been sucked up with a sucker, but we opened into, and sort of freed up the colon as if you were going to do a right colectomy, and there were numerous little bleeders or vessels in this area, but none that were actively bleeding, but the hematoma certainly appeared to be originating sort of at the paraduodenal mesentery of the right colon area.  There was some blood that kind of went over to the superior mesenteric artery, but this was certainly not where the hematoma would have started.  When this had been done and we were finding no  evidence of any active bleeding of the sutures, etc., Dr. Ezzard Standing at this time, we called and got the endoscopy equipment, and he did an upper endoscopy, and we looked at the stomach and the duodenum, and down to the third and fourth portions of the duodenum, and found no evidence of any peptic ulcer disease etc.  You could see the bile coming from the ampulla, etc.  We terminated surgery at that time.  Her postoperative hematocrit was about a 26 level.  We elected not to transfuse her since hemodynamically she was stable, and she has been followed closely for the past week.  She has had essentially no drainage from the [place] drain that had been placed in the bed.  It was approximately four days after serial hemoglobins and hematocrits before it really showed a significant bump in her hemoglobin, and her hemoglobin is approximately 10, with a hematocrit of approximately 28 to 29 at this time.  I think it would be safe to discharge her home, but I have informed both she and her husband that I do not want her to be alone during this next week since we definitely did not ligate and obviously bleeding artery at the time of surgery, and there is always a slim chance that she will re-bleed.  One part of her history that is probably significant, is that she has been on kind of a vigorous exercise program on weight reduction, and she is using Aleve and aspirin containing products for the musculoskeletal aches and pains from the exercise program. We certainly advised that the aspirin and aspirin-like products be definitely discontinued.  If she would have a further episode of repeat bleed, I would definitely repeat the CT, and then we will discuss whether to premedicate her with steroids, antihistamines, and etc., and see if we could possibly give her the hyperallergenic type IV contrast.  Hopefully, this will not be needed, and we will see her in the office on Friday, and she is to come by and have  a CBC here at [facility] prior to that visit.  She will be on iron b.i.d. with meals.  She is having good bowel movements now.  Her staples have been  removed, and the wound is steri-striped.  I have removed the Blake drain this morning.  She is given Tylox as needed for pain.  She is having very little pain at this time, and she is having good bowel function. Dictated by:   Anselm Pancoast. Zachery Dakins, M.D. Attending Physician:  Henrene Dodge DD:  04/07/01 TD:  04/07/01 Job: [id number]

## 2010-11-09 ENCOUNTER — Other Ambulatory Visit: Payer: Self-pay | Admitting: Internal Medicine

## 2011-02-13 ENCOUNTER — Other Ambulatory Visit: Payer: Self-pay | Admitting: Internal Medicine

## 2011-02-13 ENCOUNTER — Telehealth: Payer: Self-pay

## 2011-02-13 DIAGNOSIS — Z0000 Encounter for general adult medical examination without abnormal findings: Secondary | ICD-10-CM

## 2011-02-13 NOTE — Telephone Encounter (Signed)
Put order in for physical labs. 

## 2011-03-10 ENCOUNTER — Encounter: Payer: Self-pay | Admitting: Internal Medicine

## 2011-03-10 DIAGNOSIS — R7302 Impaired glucose tolerance (oral): Secondary | ICD-10-CM | POA: Insufficient documentation

## 2011-03-14 ENCOUNTER — Other Ambulatory Visit (INDEPENDENT_AMBULATORY_CARE_PROVIDER_SITE_OTHER): Payer: Medicare Other

## 2011-03-14 DIAGNOSIS — Z79899 Other long term (current) drug therapy: Secondary | ICD-10-CM

## 2011-03-14 DIAGNOSIS — Z0000 Encounter for general adult medical examination without abnormal findings: Secondary | ICD-10-CM

## 2011-03-14 LAB — URINALYSIS, ROUTINE W REFLEX MICROSCOPIC
Hgb urine dipstick: NEGATIVE
Ketones, ur: NEGATIVE
Specific Gravity, Urine: <=1.005 (ref 1.000–1.030)
Total Protein, Urine: NEGATIVE
Urine Glucose: NEGATIVE

## 2011-03-14 LAB — HEPATIC FUNCTION PANEL
AST: 29 U/L (ref 0–37)
Albumin: 4 g/dL (ref 3.5–5.2)
Alkaline Phosphatase: 57 U/L (ref 39–117)
Bilirubin, Direct: 0.1 mg/dL (ref 0.0–0.3)
Total Protein: 7 g/dL (ref 6.0–8.3)

## 2011-03-14 LAB — CBC WITH DIFFERENTIAL/PLATELET
Basophils Absolute: 0.1 10*3/uL (ref 0.0–0.1)
Eosinophils Relative: 3.7 % (ref 0.0–5.0)
HCT: 43.8 % (ref 36.0–46.0)
Lymphocytes Relative: 31.5 % (ref 12.0–46.0)
Lymphs Abs: 2.6 10*3/uL (ref 0.7–4.0)
Monocytes Relative: 7.4 % (ref 3.0–12.0)
Neutrophils Relative %: 56.7 % (ref 43.0–77.0)
Platelets: 252 10*3/uL (ref 150.0–400.0)
RDW: 12.8 % (ref 11.5–14.6)
WBC: 8.2 10*3/uL (ref 4.5–10.5)

## 2011-03-14 LAB — LIPID PANEL
Cholesterol: 155 mg/dL (ref 0–200)
HDL: 47.7 mg/dL (ref >39.00)
LDL Cholesterol: 73 mg/dL (ref 0–99)
VLDL: 34.2 mg/dL (ref 0.0–40.0)

## 2011-03-14 LAB — BASIC METABOLIC PANEL
CO2: 25 mEq/L (ref 19–32)
Calcium: 8.8 mg/dL (ref 8.4–10.5)
Glucose, Bld: 126 mg/dL — ABNORMAL HIGH (ref 70–99)
Potassium: 4.5 mEq/L (ref 3.5–5.1)
Sodium: 139 mEq/L (ref 135–145)

## 2011-03-15 ENCOUNTER — Ambulatory Visit (INDEPENDENT_AMBULATORY_CARE_PROVIDER_SITE_OTHER): Payer: Medicare Other | Admitting: Internal Medicine

## 2011-03-15 ENCOUNTER — Encounter: Payer: Self-pay | Admitting: Internal Medicine

## 2011-03-15 VITALS — BP 120/80 | HR 80 | Temp 98.7°F | Ht 68.0 in | Wt 213.2 lb

## 2011-03-15 DIAGNOSIS — Z0000 Encounter for general adult medical examination without abnormal findings: Secondary | ICD-10-CM

## 2011-03-15 DIAGNOSIS — Z0001 Encounter for general adult medical examination with abnormal findings: Secondary | ICD-10-CM | POA: Insufficient documentation

## 2011-03-15 DIAGNOSIS — E119 Type 2 diabetes mellitus without complications: Secondary | ICD-10-CM

## 2011-03-15 MED ORDER — SIMVASTATIN 40 MG PO TABS: 40.0000 mg | ORAL_TABLET | Freq: Every day | ORAL | Status: DC

## 2011-03-15 MED ORDER — LOSARTAN POTASSIUM 100 MG PO TABS: 100.0000 mg | ORAL_TABLET | Freq: Every day | ORAL | Status: DC

## 2011-03-15 MED ORDER — METFORMIN HCL 500 MG PO TABS: 500.0000 mg | ORAL_TABLET | Freq: Two times a day (BID) | ORAL | Status: DC

## 2011-03-15 MED ORDER — ASPIRIN 81 MG PO TBEC: 81.0000 mg | DELAYED_RELEASE_TABLET | Freq: Every day | ORAL | Status: DC

## 2011-03-15 NOTE — Progress Notes (Signed)
Subjective:    Patient ID: [name] [name], female    DOB: 12-09-1945, 66 y.o.   MRN: [id number]  HPI  Here for wellness and f/u;  Overall doing ok;  Pt denies CP, worsening SOB, DOE, wheezing, orthopnea, PND, worsening LE edema, palpitations, dizziness or syncope.  Pt denies neurological change such as new Headache, facial or extremity weakness.  Pt denies polydipsia, polyuria, or low sugar symptoms. Pt states overall good compliance with treatment and medications, good tolerability, and trying to follow lower cholesterol diet.  Pt denies worsening depressive symptoms, suicidal ideation or panic. No fever, wt loss, night sweats, loss of appetite, or other constitutional symptoms.  Pt states good ability with ADL's, low fall risk, home safety reviewed and adequate, no significant changes in hearing or vision, and occasionally active with exercise. No acute complaints Past Medical History  Diagnosis Date  . HYPERTENSION 09/10/2007  . HYPERLIPIDEMIA 09/10/2007  . DM 03/26/2008  . DIVERTICULOSIS, COLON 09/10/2007  . Glaucoma 03/17/2011  . Shingles 03/17/2011   Past Surgical History  Procedure Date  . Abdominal hysterectomy   . Tubal ligation     reports that she has never smoked. She does not have any smokeless tobacco history on file. She reports that she drinks alcohol. She reports that she does not use illicit drugs. family history includes Hypertension in her father and mother. Allergies  Allergen Reactions  . Sulfamethoxazole     REACTION: unspecified   No current outpatient prescriptions on file prior to visit.   Review of Systems Review of Systems  Constitutional: Negative for diaphoresis, activity change, appetite change and unexpected weight change.  HENT: Negative for hearing loss, ear pain, facial swelling, mouth sores and neck stiffness.   Eyes: Negative for pain, redness and visual disturbance.  Respiratory: Negative for shortness of breath and wheezing.   Cardiovascular: Negative for  chest pain and palpitations.  Gastrointestinal: Negative for diarrhea, blood in stool, abdominal distention and rectal pain.  Genitourinary: Negative for hematuria, flank pain and decreased urine volume.  Musculoskeletal: Negative for myalgias and joint swelling.  Skin: Negative for color change and wound.  Neurological: Negative for syncope and numbness.  Hematological: Negative for adenopathy.  Psychiatric/Behavioral: Negative for hallucinations, self-injury, decreased concentration and agitation.      Objective:   Physical Exam BP 120/80  Pulse 80  Temp(Src) 98.7 F (37.1 C) (Oral)  Ht 5\' 8"  (1.727 m)  Wt 213 lb 4 oz (96.73 kg)  BMI 32.42 kg/m2  SpO2 97% Physical Exam  VS noted Constitutional: Pt is oriented to person, place, and time. Appears well-developed and well-nourished.  HENT:  Head: Normocephalic and atraumatic.  Right Ear: External ear normal.  Left Ear: External ear normal.  Nose: Nose normal.  Mouth/Throat: Oropharynx is clear and moist.  Eyes: Conjunctivae and EOM are normal. Pupils are equal, round, and reactive to light.  Neck: Normal range of motion. Neck supple. No JVD present. No tracheal deviation present.  Cardiovascular: Normal rate, regular rhythm, normal heart sounds and intact distal pulses.   Pulmonary/Chest: Effort normal and breath sounds normal.  Abdominal: Soft. Bowel sounds are normal. There is no tenderness.  Musculoskeletal: Normal range of motion. Exhibits no edema.  Lymphadenopathy:  Has no cervical adenopathy.  Neurological: Pt is alert and oriented to person, place, and time. Pt has normal reflexes. No cranial nerve deficit.  Skin: Skin is warm and dry. No rash noted.  Psychiatric:  Has  normal mood and affect. Behavior is normal.  Assessment & Plan:

## 2011-03-15 NOTE — Assessment & Plan Note (Signed)

## 2011-03-15 NOTE — Patient Instructions (Addendum)
Please remember to followup with your GYN for the yearly pap smear and/or mammogram, as you do Please stop by the GI on 3rd floor to ask about your colonoscopy recall date You are given the hardcopy refills today Please call the phone number [phone] (the [employer]Tree System) for results of testing (the hgba1c) in 2-3 days;  When calling, simply dial the number, and when prompted enter the MRN number above (the Medical Record Number) and the # key, then the message should start. Please also start Aspirin 81 mg - 1 per day - COATED only to help reduce risk of stroke and heart attack Please return in 6 mo with Lab testing done 3-5 days before

## 2011-03-17 ENCOUNTER — Encounter: Payer: Self-pay | Admitting: Internal Medicine

## 2011-03-17 DIAGNOSIS — H409 Unspecified glaucoma: Secondary | ICD-10-CM | POA: Insufficient documentation

## 2011-03-17 DIAGNOSIS — B029 Zoster without complications: Secondary | ICD-10-CM | POA: Insufficient documentation

## 2011-03-17 HISTORY — DX: Zoster without complications: B02.9

## 2011-03-17 NOTE — Assessment & Plan Note (Signed)
stable overall by hx and exam, most recent data reviewed with pt, and pt to continue medical treatment as before  Lab Results  Component Value Date   HGBA1C 6.2 03/14/2011

## 2011-05-01 ENCOUNTER — Other Ambulatory Visit: Payer: Self-pay | Admitting: Internal Medicine

## 2011-05-01 DIAGNOSIS — Z1231 Encounter for screening mammogram for malignant neoplasm of breast: Secondary | ICD-10-CM

## 2011-05-04 ENCOUNTER — Ambulatory Visit (HOSPITAL_COMMUNITY)
Admission: RE | Admit: 2011-05-04 | Discharge: 2011-05-04 | Disposition: A | Discharge status: Home / Self Care | Payer: Medicare Other | Source: Ambulatory Visit | Attending: Internal Medicine | Admitting: Internal Medicine

## 2011-05-04 DIAGNOSIS — Z1231 Encounter for screening mammogram for malignant neoplasm of breast: Secondary | ICD-10-CM | POA: Insufficient documentation

## 2011-09-13 ENCOUNTER — Ambulatory Visit: Payer: Medicare Other | Admitting: Internal Medicine

## 2011-10-12 ENCOUNTER — Other Ambulatory Visit (INDEPENDENT_AMBULATORY_CARE_PROVIDER_SITE_OTHER): Payer: Medicare Other

## 2011-10-12 DIAGNOSIS — Z79899 Other long term (current) drug therapy: Secondary | ICD-10-CM

## 2011-10-12 DIAGNOSIS — Z0000 Encounter for general adult medical examination without abnormal findings: Secondary | ICD-10-CM

## 2011-10-12 DIAGNOSIS — E785 Hyperlipidemia, unspecified: Secondary | ICD-10-CM

## 2011-10-12 LAB — BASIC METABOLIC PANEL
Chloride: 106 mEq/L (ref 96–112)
Potassium: 4.6 mEq/L (ref 3.5–5.1)
Sodium: 139 mEq/L (ref 135–145)

## 2011-10-12 LAB — LIPID PANEL
Total CHOL/HDL Ratio: 3
VLDL: 45.6 mg/dL — ABNORMAL HIGH (ref 0.0–40.0)

## 2011-10-15 LAB — LDL CHOLESTEROL, DIRECT: Direct LDL: 79.2 mg/dL

## 2011-10-16 ENCOUNTER — Encounter: Payer: Self-pay | Admitting: Internal Medicine

## 2011-10-16 ENCOUNTER — Ambulatory Visit (INDEPENDENT_AMBULATORY_CARE_PROVIDER_SITE_OTHER): Payer: Medicare Other | Admitting: Internal Medicine

## 2011-10-16 VITALS — BP 102/74 | HR 80 | Temp 98.3°F | Ht 68.0 in | Wt 209.8 lb

## 2011-10-16 DIAGNOSIS — E119 Type 2 diabetes mellitus without complications: Secondary | ICD-10-CM

## 2011-10-16 DIAGNOSIS — Z0000 Encounter for general adult medical examination without abnormal findings: Secondary | ICD-10-CM

## 2011-10-16 DIAGNOSIS — E785 Hyperlipidemia, unspecified: Secondary | ICD-10-CM

## 2011-10-16 DIAGNOSIS — H905 Unspecified sensorineural hearing loss: Secondary | ICD-10-CM

## 2011-10-16 DIAGNOSIS — M1990 Unspecified osteoarthritis, unspecified site: Secondary | ICD-10-CM

## 2011-10-16 DIAGNOSIS — Z23 Encounter for immunization: Secondary | ICD-10-CM

## 2011-10-16 DIAGNOSIS — I10 Essential (primary) hypertension: Secondary | ICD-10-CM

## 2011-10-16 NOTE — Patient Instructions (Addendum)
Continue all other medications as before Please have the pharmacy call with any refills you may need. You may wish to consider swimming, recumbant bike, or ellipitical for easier on the knees exercise You can take 1-2 tylenol 8 hr every 8 hrs if needed for pain Please return in 6 mo with Lab testing done 3-5 days before

## 2011-10-16 NOTE — Progress Notes (Signed)
Subjective:    Patient ID: [name] [name], female    DOB: 02-24-1945, 66 y.o.   MRN: [id number]  HPI Here to f/u; overall doing ok,  Pt denies chest pain, increased sob or doe, wheezing, orthopnea, PND, increased LE swelling, palpitations, dizziness or syncope.  Pt denies new neurological symptoms such as new headache, or facial or extremity weakness or numbness   Pt denies polydipsia, polyuria, or low sugar symptoms such as weakness or confusion improved with po intake.  Pt states overall good compliance with meds, trying to follow lower cholesterol, diabetic diet, wt overall stable but little exercise however.  Did have ortho eval with [facility] for back and knee pain - has knee and lumbar DJD definite, and likely c-spine and hips as well per pt.  Had both knees cortisone but doesn't feel helped well since only lasted about a month.  Has been to aovid knee activities such as even bike riding so hard to lose wt, and lifting wts makes the neck pain worse.  Has not tried swimming lately, or ellipitcal or recumbant bike.  Trying to avoid pain medication..  Wt overall stable.   Pt denies fever, wt loss, night sweats, loss of appetite, or other constitutional symptoms Past Medical History  Diagnosis Date  . HYPERTENSION 09/10/2007  . HYPERLIPIDEMIA 09/10/2007  . DM 03/26/2008  . DIVERTICULOSIS, COLON 09/10/2007  . Glaucoma 03/17/2011  . Shingles 03/17/2011   Past Surgical History  Procedure Date  . Abdominal hysterectomy   . Tubal ligation     reports that she has never smoked. She does not have any smokeless tobacco history on file. She reports that she drinks alcohol. She reports that she does not use illicit drugs. family history includes Hypertension in her father and mother. Allergies  Allergen Reactions  . Ivp Dye (Iodinated Diagnostic Agents)   . Sulfamethoxazole     REACTION: unspecified   Current Outpatient Prescriptions on File Prior to Visit  Medication Sig Dispense Refill  . b complex  vitamins capsule Take 1 capsule by mouth daily.      . Coenzyme Q10 (COQ10 PO) Take by mouth daily.      [name] losartan (COZAAR) 100 MG tablet Take 1 tablet (100 mg total) by mouth daily.  90 tablet  3  . metFORMIN (GLUCOPHAGE) 500 MG tablet Take 1 tablet (500 mg total) by mouth 2 (two) times daily with a meal.  180 tablet  3  . Multiple Vitamin (MULTIVITAMIN) tablet Take 1 tablet by mouth daily.      . simvastatin (ZOCOR) 40 MG tablet Take 1 tablet (40 mg total) by mouth daily.  90 tablet  3   Review of Systems Review of Systems  Constitutional: Negative for diaphoresis and unexpected weight change.  HENT: Negative for tinnitus.   Eyes: Negative for photophobia and visual disturbance.  Respiratory: Negative for choking and stridor.   Gastrointestinal: Negative for vomiting and blood in stool.  Genitourinary: Negative for hematuria and decreased urine volume.  Musculoskeletal: Negative for gait problem.  Skin: Negative for color change and wound.  Neurological: Negative for tremors and numbness.  Psychiatric/Behavioral: Negative for decreased concentration. The patient is not hyperactive    Objective:   Physical Exam BP 102/74  Pulse 80  Temp 98.3 F (36.8 C) (Oral)  Ht 5\' 8"  (1.727 m)  Wt 209 lb 12 oz (95.142 kg)  BMI 31.89 kg/m2  SpO2 96% Physical Exam  VS noted Constitutional: Pt appears well-developed and well-nourished.  HENT: Head: Normocephalic.  Right Ear: External ear normal.  Left Ear: External ear normal.  Eyes: Conjunctivae and EOM are normal. Pupils are equal, round, and reactive to light.  Neck: Normal range of motion. Neck supple.  Cardiovascular: Normal rate and regular rhythm.   Pulmonary/Chest: Effort normal and breath sounds normal.  Abd:  Soft, NT, non-distended, + BS Neurological: Pt is alert. No cranial nerve deficit.  Skin: Skin is warm. No erythema.  Psychiatric: Pt behavior is normal. Thought content normal.     Assessment & Plan:

## 2011-10-21 ENCOUNTER — Encounter: Payer: Self-pay | Admitting: Internal Medicine

## 2011-10-21 DIAGNOSIS — M1990 Unspecified osteoarthritis, unspecified site: Secondary | ICD-10-CM | POA: Insufficient documentation

## 2011-10-21 DIAGNOSIS — H905 Unspecified sensorineural hearing loss: Secondary | ICD-10-CM | POA: Insufficient documentation

## 2011-10-21 HISTORY — DX: Unspecified osteoarthritis, unspecified site: M19.90

## 2011-10-21 NOTE — Assessment & Plan Note (Signed)
stable overall by hx and exam, most recent data reviewed with pt, and pt to continue medical treatment as before Lab Results  Component Value Date   HGBA1C 5.8 10/12/2011

## 2011-10-21 NOTE — Assessment & Plan Note (Signed)
stable overall by hx and exam, most recent data reviewed with pt, and pt to continue medical treatment as before BP Readings from Last 3 Encounters:  10/16/11 102/74  03/15/11 120/80  02/09/10 132/88

## 2011-10-21 NOTE — Assessment & Plan Note (Signed)
Ok for tylenol 8 hr - 1-2 q8 hrs prn,  to f/u any worsening symptoms or concerns

## 2011-10-21 NOTE — Assessment & Plan Note (Signed)
stable overall by hx and exam, most recent data reviewed with pt, and pt to continue medical treatment as before Lab Results  Component Value Date   LDLCALC 73 03/14/2011

## 2011-10-21 NOTE — Assessment & Plan Note (Signed)
Not due to wax, for ENT referral

## 2012-03-24 ENCOUNTER — Other Ambulatory Visit (INDEPENDENT_AMBULATORY_CARE_PROVIDER_SITE_OTHER): Payer: Medicare Other

## 2012-03-24 DIAGNOSIS — E119 Type 2 diabetes mellitus without complications: Secondary | ICD-10-CM

## 2012-03-24 DIAGNOSIS — Z0000 Encounter for general adult medical examination without abnormal findings: Secondary | ICD-10-CM

## 2012-03-24 LAB — URINALYSIS, ROUTINE W REFLEX MICROSCOPIC
Ketones, ur: NEGATIVE
Specific Gravity, Urine: <=1.005 (ref 1.000–1.030)
Total Protein, Urine: NEGATIVE
Urine Glucose: NEGATIVE
pH: 6 (ref 5.0–8.0)

## 2012-03-24 LAB — BASIC METABOLIC PANEL
BUN: 16 mg/dL (ref 6–23)
Chloride: 105 mEq/L (ref 96–112)
Creatinine, Ser: 0.8 mg/dL (ref 0.4–1.2)
GFR: 80.36 mL/min (ref >60.00)
Potassium: 4.3 mEq/L (ref 3.5–5.1)

## 2012-03-24 LAB — CBC WITH DIFFERENTIAL/PLATELET
Basophils Relative: 0.5 % (ref 0.0–3.0)
HCT: 45.5 % (ref 36.0–46.0)
Hemoglobin: 15.3 g/dL — ABNORMAL HIGH (ref 12.0–15.0)
Lymphocytes Relative: 24.5 % (ref 12.0–46.0)
Lymphs Abs: 2 10*3/uL (ref 0.7–4.0)
Monocytes Relative: 7.3 % (ref 3.0–12.0)
Neutro Abs: 5.4 10*3/uL (ref 1.4–7.7)
RBC: 5.01 Mil/uL (ref 3.87–5.11)

## 2012-03-24 LAB — HEPATIC FUNCTION PANEL
ALT: 19 U/L (ref 0–35)
Bilirubin, Direct: 0.2 mg/dL (ref 0.0–0.3)
Total Bilirubin: 1.4 mg/dL — ABNORMAL HIGH (ref 0.3–1.2)

## 2012-03-24 LAB — LDL CHOLESTEROL, DIRECT: Direct LDL: 65.5 mg/dL

## 2012-03-24 LAB — LIPID PANEL
Cholesterol: 133 mg/dL (ref 0–200)
HDL: 41.5 mg/dL (ref >39.00)
Total CHOL/HDL Ratio: 3
VLDL: 40.2 mg/dL — ABNORMAL HIGH (ref 0.0–40.0)

## 2012-03-24 LAB — HEMOGLOBIN A1C: Hgb A1c MFr Bld: 6.1 % (ref 4.6–6.5)

## 2012-03-24 LAB — MICROALBUMIN / CREATININE URINE RATIO: Microalb, Ur: 0.4 mg/dL (ref 0.0–1.9)

## 2012-03-25 ENCOUNTER — Ambulatory Visit (INDEPENDENT_AMBULATORY_CARE_PROVIDER_SITE_OTHER): Payer: Medicare Other | Admitting: Internal Medicine

## 2012-03-25 ENCOUNTER — Encounter: Payer: Self-pay | Admitting: Internal Medicine

## 2012-03-25 VITALS — BP 114/72 | HR 85 | Temp 97.7°F | Resp 16 | Wt 210.2 lb

## 2012-03-25 DIAGNOSIS — E119 Type 2 diabetes mellitus without complications: Secondary | ICD-10-CM

## 2012-03-25 DIAGNOSIS — Z0000 Encounter for general adult medical examination without abnormal findings: Secondary | ICD-10-CM

## 2012-03-25 MED ORDER — METFORMIN HCL 500 MG PO TABS: 500.0000 mg | ORAL_TABLET | Freq: Two times a day (BID) | ORAL | Status: DC

## 2012-03-25 MED ORDER — LOSARTAN POTASSIUM 100 MG PO TABS: 100.0000 mg | ORAL_TABLET | Freq: Every day | ORAL | Status: DC

## 2012-03-25 MED ORDER — SIMVASTATIN 40 MG PO TABS: 40.0000 mg | ORAL_TABLET | Freq: Every day | ORAL | Status: DC

## 2012-03-25 NOTE — Patient Instructions (Addendum)
Please use the prescription for the shingles shot to take to your pharmacy, and please let us know by message if you have this done You will be contacted regarding the referral for: colonoscopy Please continue all other medications as before, and refills have been done if requested. Please have the pharmacy call with any other refills you may need. Please remember to followup with your GYN for the yearly pap smear and/or mammogram as you do Please continue your efforts at being more active, low cholesterol diet, and weight control. You are otherwise up to date with prevention measures today. Thank you for enrolling in MyChart. Please follow the instructions below to securely access your online medical record. MyChart allows you to send messages to your doctor, view your test results, renew your prescriptions, schedule appointments, and more. To Log into My Chart online, please go by [employer] or [employer] to [email or web address], or download the MyChart App from the [employer] of [employer] .  Your Username is: [email or web address] (pass megan) Please send a practice Message on Mychart later today. Please return in 1 year for your yearly visit, or sooner if needed, with Lab testing done 3-5 days before

## 2012-03-27 NOTE — Progress Notes (Signed)
Subjective:    Patient ID: [name] [name], female    DOB: [birth year], 67 y.o.   MRN: [id number]  HPI  Here for wellness and f/u;  Overall doing ok;  Pt denies CP, worsening SOB, DOE, wheezing, orthopnea, PND, worsening LE edema, palpitations, dizziness or syncope.  Pt denies neurological change such as new headache, facial or extremity weakness.  Pt denies polydipsia, polyuria, or low sugar symptoms. Pt states overall good compliance with treatment and medications, good tolerability, and has been trying to follow lower cholesterol diet.  Pt denies worsening depressive symptoms, suicidal ideation or panic. No fever, night sweats, wt loss, loss of appetite, or other constitutional symptoms.  Pt states good ability with ADL's, has low fall risk, home safety reviewed and adequate, no other significant changes in hearing or vision, and only occasionally active with exercise.  Refuses ASA use.  Has recurrent right nose bleed, controlled with ocean nasal spray but does not use regularly.  Due for colonoscopy Past Medical History  Diagnosis Date  . HYPERTENSION 09/10/2007  . HYPERLIPIDEMIA 09/10/2007  . DM 03/26/2008  . DIVERTICULOSIS, COLON 09/10/2007  . Glaucoma(365) 03/17/2011  . Shingles 03/17/2011  . Osteoarthritis 10/21/2011   Past Surgical History  Procedure Laterality Date  . Abdominal hysterectomy    . Tubal ligation      reports that she has never smoked. She does not have any smokeless tobacco history on file. She reports that  drinks alcohol. She reports that she does not use illicit drugs. family history includes Hypertension in her father and mother. Allergies  Allergen Reactions  . Ivp Dye (Iodinated Diagnostic Agents)   . Sulfamethoxazole     REACTION: unspecified   Current Outpatient Prescriptions on File Prior to Visit  Medication Sig Dispense Refill  . b complex vitamins capsule Take 1 capsule by mouth daily.      . Multiple Vitamin (MULTIVITAMIN) tablet Take 1 tablet by mouth daily.       . Coenzyme Q10 (COQ10 PO) Take by mouth daily.       No current facility-administered medications on file prior to visit.   Review of Systems Constitutional: Negative for diaphoresis, activity change, appetite change or unexpected weight change.  HENT: Negative for hearing loss, ear pain, facial swelling, mouth sores and neck stiffness.   Eyes: Negative for pain, redness and visual disturbance.  Respiratory: Negative for shortness of breath and wheezing.   Cardiovascular: Negative for chest pain and palpitations.  Gastrointestinal: Negative for diarrhea, blood in stool, abdominal distention or other pain Genitourinary: Negative for hematuria, flank pain or change in urine volume.  Musculoskeletal: Negative for myalgias and joint swelling.  Skin: Negative for color change and wound.  Neurological: Negative for syncope and numbness. other than noted Hematological: Negative for adenopathy.  Psychiatric/Behavioral: Negative for hallucinations, self-injury, decreased concentration and agitation.      Objective:   Physical Exam BP 114/72  Pulse 85  Temp(Src) 97.7 F (36.5 C) (Oral)  Resp 16  Wt 210 lb 4 oz (95.369 kg)  BMI 31.98 kg/m2  SpO2 97% VS noted,  Constitutional: Pt is oriented to person, place, and time. Appears well-developed and well-nourished.  Head: Normocephalic and atraumatic.  Right Ear: External ear normal.  Left Ear: External ear normal.  Nose: Nose normal.  Mouth/Throat: Oropharynx is clear and moist.  Eyes: Conjunctivae and EOM are normal. Pupils are equal, round, and reactive to light.  Neck: Normal range of motion. Neck supple. No JVD present. No tracheal deviation present.  Cardiovascular: Normal rate, regular rhythm, normal heart sounds and intact distal pulses.   Pulmonary/Chest: Effort normal and breath sounds normal.  Abdominal: Soft. Bowel sounds are normal. There is no tenderness. No HSM  Musculoskeletal: Normal range of motion. Exhibits no edema.   Lymphadenopathy:  Has no cervical adenopathy.  Neurological: Pt is alert and oriented to person, place, and time. Pt has normal reflexes. No cranial nerve deficit.  Skin: Skin is warm and dry. No rash noted.  Psychiatric:  Has  normal mood and affect. Behavior is normal.     Assessment & Plan:

## 2012-03-27 NOTE — Assessment & Plan Note (Signed)

## 2012-03-27 NOTE — Assessment & Plan Note (Signed)
stable overall by history and exam, recent data reviewed with pt, and pt to continue medical treatment as before,  to f/u any worsening symptoms or concerns Lab Results  Component Value Date   HGBA1C 6.1 03/24/2012

## 2012-03-28 ENCOUNTER — Encounter: Payer: Self-pay | Admitting: Internal Medicine

## 2012-08-11 ENCOUNTER — Encounter: Payer: Self-pay | Admitting: Internal Medicine

## 2012-08-11 LAB — HM DIABETES EYE EXAM

## 2012-09-10 ENCOUNTER — Other Ambulatory Visit: Payer: Self-pay

## 2012-12-11 ENCOUNTER — Other Ambulatory Visit: Payer: Self-pay

## 2013-01-27 ENCOUNTER — Telehealth: Payer: Self-pay | Admitting: Internal Medicine

## 2013-01-27 NOTE — Telephone Encounter (Signed)
Recd records from [facility]., Forwarding 6pgs to Dr.John

## 2013-02-19 ENCOUNTER — Telehealth: Payer: Self-pay | Admitting: Internal Medicine

## 2013-02-19 NOTE — Telephone Encounter (Signed)
Recd records from [facility] Forwarding 5pgs to Stuart

## 2013-04-21 ENCOUNTER — Telehealth: Payer: Self-pay

## 2013-04-21 MED ORDER — METFORMIN HCL 500 MG PO TABS: 500.0000 mg | ORAL_TABLET | Freq: Two times a day (BID) | ORAL | Status: DC

## 2013-04-21 MED ORDER — LOSARTAN POTASSIUM 100 MG PO TABS: 100.0000 mg | ORAL_TABLET | Freq: Every day | ORAL | Status: DC

## 2013-04-21 MED ORDER — SIMVASTATIN 40 MG PO TABS: 40.0000 mg | ORAL_TABLET | Freq: Every day | ORAL | Status: DC

## 2013-04-21 NOTE — Telephone Encounter (Signed)
Prescriptions sent in to [facility]

## 2013-06-23 ENCOUNTER — Other Ambulatory Visit: Payer: Medicare HMO

## 2013-06-23 ENCOUNTER — Telehealth: Payer: Self-pay | Admitting: Internal Medicine

## 2013-06-23 DIAGNOSIS — E1165 Type 2 diabetes mellitus with hyperglycemia: Principal | ICD-10-CM

## 2013-06-23 DIAGNOSIS — Z0000 Encounter for general adult medical examination without abnormal findings: Secondary | ICD-10-CM

## 2013-06-23 DIAGNOSIS — IMO0001 Reserved for inherently not codable concepts without codable children: Secondary | ICD-10-CM

## 2013-06-23 NOTE — Telephone Encounter (Signed)
Message copied by Biagio Borg on Tue Jun 23, 2013 12:59 PM ------      Message from: Jamesetta Orleans      Created: Tue Jun 23, 2013  9:37 AM       Patient needs labs entered, also wanted A1c checked. ------

## 2013-06-23 NOTE — Telephone Encounter (Signed)
Labs ordered.

## 2013-06-24 ENCOUNTER — Other Ambulatory Visit: Payer: Self-pay | Admitting: Obstetrics & Gynecology

## 2013-06-24 ENCOUNTER — Other Ambulatory Visit (INDEPENDENT_AMBULATORY_CARE_PROVIDER_SITE_OTHER): Payer: Medicare Other

## 2013-06-24 DIAGNOSIS — E1165 Type 2 diabetes mellitus with hyperglycemia: Principal | ICD-10-CM

## 2013-06-24 DIAGNOSIS — Z0000 Encounter for general adult medical examination without abnormal findings: Secondary | ICD-10-CM

## 2013-06-24 DIAGNOSIS — IMO0001 Reserved for inherently not codable concepts without codable children: Secondary | ICD-10-CM

## 2013-06-24 LAB — CBC WITH DIFFERENTIAL/PLATELET
BASOS PCT: 0.5 % (ref 0.0–3.0)
Basophils Absolute: 0 10*3/uL (ref 0.0–0.1)
EOS PCT: 2.8 % (ref 0.0–5.0)
Eosinophils Absolute: 0.2 10*3/uL (ref 0.0–0.7)
HEMATOCRIT: 45.9 % (ref 36.0–46.0)
HEMOGLOBIN: 15.5 g/dL — AB (ref 12.0–15.0)
LYMPHS ABS: 2.6 10*3/uL (ref 0.7–4.0)
LYMPHS PCT: 29.6 % (ref 12.0–46.0)
MCHC: 33.8 g/dL (ref 30.0–36.0)
MCV: 91.8 fl (ref 78.0–100.0)
MONOS PCT: 7.4 % (ref 3.0–12.0)
Monocytes Absolute: 0.6 10*3/uL (ref 0.1–1.0)
NEUTROS ABS: 5.2 10*3/uL (ref 1.4–7.7)
Neutrophils Relative %: 59.7 % (ref 43.0–77.0)
Platelets: 293 10*3/uL (ref 150.0–400.0)
RBC: 4.99 Mil/uL (ref 3.87–5.11)
RDW: 12.6 % (ref 11.5–15.5)
WBC: 8.7 10*3/uL (ref 4.0–10.5)

## 2013-06-24 LAB — BASIC METABOLIC PANEL
BUN: 19 mg/dL (ref 6–23)
CHLORIDE: 106 meq/L (ref 96–112)
CO2: 22 mEq/L (ref 19–32)
CREATININE: 0.8 mg/dL (ref 0.4–1.2)
Calcium: 9.1 mg/dL (ref 8.4–10.5)
GFR: 72.33 mL/min (ref >60.00)
GLUCOSE: 128 mg/dL — AB (ref 70–99)
Potassium: 4.3 mEq/L (ref 3.5–5.1)
Sodium: 139 mEq/L (ref 135–145)

## 2013-06-24 LAB — URINALYSIS, ROUTINE W REFLEX MICROSCOPIC
Bilirubin Urine: NEGATIVE
KETONES UR: NEGATIVE
Nitrite: NEGATIVE
PH: 6 (ref 5.0–8.0)
TOTAL PROTEIN, URINE-UPE24: NEGATIVE
URINE GLUCOSE: NEGATIVE
Urobilinogen, UA: 0.2 (ref 0.0–1.0)

## 2013-06-24 LAB — LIPID PANEL
CHOLESTEROL: 145 mg/dL (ref 0–200)
HDL: 45.3 mg/dL (ref >39.00)
LDL CALC: 73 mg/dL (ref 0–99)
TRIGLYCERIDES: 132 mg/dL (ref 0.0–149.0)
Total CHOL/HDL Ratio: 3
VLDL: 26.4 mg/dL (ref 0.0–40.0)

## 2013-06-24 LAB — HEPATIC FUNCTION PANEL
ALBUMIN: 4.2 g/dL (ref 3.5–5.2)
ALT: 21 U/L (ref 0–35)
AST: 25 U/L (ref 0–37)
Alkaline Phosphatase: 58 U/L (ref 39–117)
Bilirubin, Direct: 0.1 mg/dL (ref 0.0–0.3)
TOTAL PROTEIN: 7 g/dL (ref 6.0–8.3)
Total Bilirubin: 0.8 mg/dL (ref 0.2–1.2)

## 2013-06-24 LAB — TSH: TSH: 3.3 u[IU]/mL (ref 0.35–4.50)

## 2013-06-24 LAB — HEMOGLOBIN A1C: Hgb A1c MFr Bld: 6.4 % (ref 4.6–6.5)

## 2013-06-24 LAB — MICROALBUMIN / CREATININE URINE RATIO
CREATININE, U: 39.6 mg/dL
MICROALB UR: 0.7 mg/dL (ref 0.0–1.9)
Microalb Creat Ratio: 1.8 mg/g (ref 0.0–30.0)

## 2013-06-25 ENCOUNTER — Ambulatory Visit (INDEPENDENT_AMBULATORY_CARE_PROVIDER_SITE_OTHER): Payer: Medicare HMO | Admitting: Internal Medicine

## 2013-06-25 ENCOUNTER — Encounter: Payer: Self-pay | Admitting: Internal Medicine

## 2013-06-25 VITALS — BP 118/82 | HR 97 | Temp 98.0°F | Ht 68.0 in | Wt 212.5 lb

## 2013-06-25 DIAGNOSIS — Z136 Encounter for screening for cardiovascular disorders: Secondary | ICD-10-CM

## 2013-06-25 DIAGNOSIS — Z23 Encounter for immunization: Secondary | ICD-10-CM

## 2013-06-25 DIAGNOSIS — Z0000 Encounter for general adult medical examination without abnormal findings: Secondary | ICD-10-CM

## 2013-06-25 DIAGNOSIS — E1165 Type 2 diabetes mellitus with hyperglycemia: Secondary | ICD-10-CM

## 2013-06-25 DIAGNOSIS — IMO0001 Reserved for inherently not codable concepts without codable children: Secondary | ICD-10-CM

## 2013-06-25 MED ORDER — LOSARTAN POTASSIUM 100 MG PO TABS: 100.0000 mg | ORAL_TABLET | Freq: Every day | ORAL | Status: DC

## 2013-06-25 MED ORDER — METFORMIN HCL 500 MG PO TABS: 500.0000 mg | ORAL_TABLET | Freq: Two times a day (BID) | ORAL | Status: DC

## 2013-06-25 MED ORDER — SIMVASTATIN 40 MG PO TABS: 40.0000 mg | ORAL_TABLET | Freq: Every day | ORAL | Status: DC

## 2013-06-25 NOTE — Addendum Note (Signed)
Addended by: Sharon Seller B on: 06/25/2013 04:43 PM   Modules accepted: Orders

## 2013-06-25 NOTE — Assessment & Plan Note (Signed)

## 2013-06-25 NOTE — Progress Notes (Signed)
Subjective:    Patient ID: [name] [name], female    DOB: January 24, 1946, 68 y.o.   MRN: [id number]  HPI  Here for wellness and f/u;  Overall doing ok;  Pt denies CP, worsening SOB, DOE, wheezing, orthopnea, PND, worsening LE edema, palpitations, dizziness or syncope.  Pt denies neurological change such as new headache, facial or extremity weakness.  Pt denies polydipsia, polyuria, or low sugar symptoms. Pt states overall good compliance with treatment and medications, good tolerability, and has been trying to follow lower cholesterol diet.  Pt denies worsening depressive symptoms, suicidal ideation or panic. No fever, night sweats, wt loss, loss of appetite, or other constitutional symptoms.  Pt states good ability with ADL's, has low fall risk, home safety reviewed and adequate, no other significant changes in hearing or vision, and only occasionally active with exercise.  No current complaints Past Medical History  Diagnosis Date  . HYPERTENSION 09/10/2007  . HYPERLIPIDEMIA 09/10/2007  . DM 03/26/2008  . DIVERTICULOSIS, COLON 09/10/2007  . Glaucoma 03/17/2011  . Shingles 03/17/2011  . Osteoarthritis 10/21/2011   Past Surgical History  Procedure Laterality Date  . Abdominal hysterectomy    . Tubal ligation      reports that she has never smoked. She does not have any smokeless tobacco history on file. She reports that she drinks alcohol. She reports that she does not use illicit drugs. family history includes Hypertension in her father and mother. Allergies  Allergen Reactions  . Ivp Dye [Iodinated Diagnostic Agents]   . Sulfamethoxazole     REACTION: unspecified   Current Outpatient Prescriptions on File Prior to Visit  Medication Sig Dispense Refill  . Multiple Vitamin (MULTIVITAMIN) tablet Take 1 tablet by mouth daily.       No current facility-administered medications on file prior to visit.    Review of Systems Constitutional: Negative for increased diaphoresis, other activity, appetite  or other siginficant weight change  HENT: Negative for worsening hearing loss, ear pain, facial swelling, mouth sores and neck stiffness.   Eyes: Negative for other worsening pain, redness or visual disturbance.  Respiratory: Negative for shortness of breath and wheezing.   Cardiovascular: Negative for chest pain and palpitations.  Gastrointestinal: Negative for diarrhea, blood in stool, abdominal distention or other pain Genitourinary: Negative for hematuria, flank pain or change in urine volume.  Musculoskeletal: Negative for myalgias or other joint complaints.  Skin: Negative for color change and wound.  Neurological: Negative for syncope and numbness. other than noted Hematological: Negative for adenopathy. or other swelling Psychiatric/Behavioral: Negative for hallucinations, self-injury, decreased concentration or other worsening agitation.      Objective:   Physical Exam BP 118/82  Pulse 97  Temp(Src) 98 F (36.7 C) (Oral)  Ht 5\' 8"  (1.727 m)  Wt 212 lb 8 oz (96.389 kg)  BMI 32.32 kg/m2  SpO2 94% VS noted,  Constitutional: Pt is oriented to person, place, and time. Appears well-developed and well-nourished.  Head: Normocephalic and atraumatic.  Right Ear: External ear normal.  Left Ear: External ear normal.  Nose: Nose normal.  Mouth/Throat: Oropharynx is clear and moist.  Eyes: Conjunctivae and EOM are normal. Pupils are equal, round, and reactive to light.  Neck: Normal range of motion. Neck supple. No JVD present. No tracheal deviation present.  Cardiovascular: Normal rate, regular rhythm, normal heart sounds and intact distal pulses.   Pulmonary/Chest: Effort normal and breath sounds without rales or wheezing  Abdominal: Soft. Bowel sounds are normal. NT. No HSM  Musculoskeletal:  Normal range of motion. Exhibits no edema.  Lymphadenopathy:  Has no cervical adenopathy.  Neurological: Pt is alert and oriented to person, place, and time. Pt has normal reflexes. No  cranial nerve deficit. Motor grossly intact Skin: Skin is warm and dry. No rash noted.  Psychiatric:  Has normal mood and affect. Behavior is normal.     Assessment & Plan:

## 2013-06-25 NOTE — Patient Instructions (Addendum)
You had the new Prevnar pneumonia shot today  Please continue all other medications as before, and refills have been done if requested. Please have the pharmacy call with any other refills you may need.  Please continue your efforts at being more active, low cholesterol diet, and weight control.  You are otherwise up to date with prevention measures today.  Please keep your appointments with your specialists as you may have planned  Your lab work and EKG were OK today  Please call for your yearly mammogram  Please return in 6 months, or sooner if needed, with Lab testing done 3-5 days before

## 2013-06-25 NOTE — Progress Notes (Signed)
Pre visit review using our clinic review tool, if applicable. No additional management support is needed unless otherwise documented below in the visit note. 

## 2013-07-17 ENCOUNTER — Other Ambulatory Visit: Payer: Self-pay | Admitting: Obstetrics & Gynecology

## 2013-07-20 LAB — CYTOLOGY - PAP

## 2013-09-24 ENCOUNTER — Encounter: Payer: Self-pay | Admitting: Internal Medicine

## 2013-09-24 LAB — HM DIABETES EYE EXAM

## 2013-10-29 ENCOUNTER — Other Ambulatory Visit (INDEPENDENT_AMBULATORY_CARE_PROVIDER_SITE_OTHER): Payer: Medicare HMO

## 2013-10-29 ENCOUNTER — Encounter: Payer: Self-pay | Admitting: Internal Medicine

## 2013-10-29 ENCOUNTER — Telehealth: Payer: Self-pay

## 2013-10-29 ENCOUNTER — Ambulatory Visit (INDEPENDENT_AMBULATORY_CARE_PROVIDER_SITE_OTHER): Payer: Medicare HMO | Admitting: Internal Medicine

## 2013-10-29 VITALS — BP 114/72 | HR 96 | Temp 98.4°F | Wt 213.4 lb

## 2013-10-29 DIAGNOSIS — E119 Type 2 diabetes mellitus without complications: Secondary | ICD-10-CM

## 2013-10-29 DIAGNOSIS — R300 Dysuria: Secondary | ICD-10-CM

## 2013-10-29 DIAGNOSIS — I10 Essential (primary) hypertension: Secondary | ICD-10-CM

## 2013-10-29 DIAGNOSIS — Z23 Encounter for immunization: Secondary | ICD-10-CM

## 2013-10-29 DIAGNOSIS — N390 Urinary tract infection, site not specified: Secondary | ICD-10-CM | POA: Insufficient documentation

## 2013-10-29 LAB — URINALYSIS, ROUTINE W REFLEX MICROSCOPIC
Bilirubin Urine: NEGATIVE
Ketones, ur: NEGATIVE
NITRITE: NEGATIVE
PH: 6 (ref 5.0–8.0)
TOTAL PROTEIN, URINE-UPE24: NEGATIVE
URINE GLUCOSE: NEGATIVE
Urobilinogen, UA: 0.2 (ref 0.0–1.0)

## 2013-10-29 MED ORDER — CEPHALEXIN 500 MG PO CAPS: 500.0000 mg | ORAL_CAPSULE | Freq: Four times a day (QID) | ORAL | Status: DC

## 2013-10-29 NOTE — Progress Notes (Signed)
Pre visit review using our clinic review tool, if applicable. No additional management support is needed unless otherwise documented below in the visit note. 

## 2013-10-29 NOTE — Telephone Encounter (Signed)
Patient informed to come in today go to the lab first to have urine checked then check in for appointment.

## 2013-10-29 NOTE — Progress Notes (Signed)
   Subjective:    Patient ID: [name] [name], female    DOB: [birth year], 68 y.o.   MRN: [id number]  HPI   Here for acute visit- c/o 2-3 days onset mod urinary symptoms such as dysuria, frequency,but no urgency, flank pain, hematuria or n/v, fever, chills. Last UTI approx 40 yrs. No recent onset incontinence.   Pt denies polydipsia, polyuria.  Pt denies chest pain, increased sob or doe, wheezing, orthopnea, PND, increased LE swelling, palpitations, dizziness or syncope.  Past Medical History  Diagnosis Date  . HYPERTENSION 09/10/2007  . HYPERLIPIDEMIA 09/10/2007  . DM 03/26/2008  . DIVERTICULOSIS, COLON 09/10/2007  . Glaucoma 03/17/2011  . Shingles 03/17/2011  . Osteoarthritis 10/21/2011   Past Surgical History  Procedure Laterality Date  . Abdominal hysterectomy    . Tubal ligation      reports that she has never smoked. She does not have any smokeless tobacco history on file. She reports that she drinks alcohol. She reports that she does not use illicit drugs. family history includes Hypertension in her father and mother. Allergies  Allergen Reactions  . Ivp Dye [Iodinated Diagnostic Agents]   . Sulfamethoxazole     REACTION: unspecified   Current Outpatient Prescriptions on File Prior to Visit  Medication Sig Dispense Refill  . Krill Oil (MAXIMUM RED KRILL PO) Take by mouth daily.      [name] losartan (COZAAR) 100 MG tablet Take 1 tablet (100 mg total) by mouth daily.  90 tablet  3  . metFORMIN (GLUCOPHAGE) 500 MG tablet Take 1 tablet (500 mg total) by mouth 2 (two) times daily with a meal.  180 tablet  3  . Multiple Vitamin (MULTIVITAMIN) tablet Take 1 tablet by mouth daily.      . simvastatin (ZOCOR) 40 MG tablet Take 1 tablet (40 mg total) by mouth daily.  90 tablet  3   No current facility-administered medications on file prior to visit.   Review of Systems  Constitutional: Negative for unusual diaphoresis or other sweats  HENT: Negative for ringing in ear Eyes: Negative for double  vision or worsening visual disturbance.  Respiratory: Negative for choking and stridor.   Gastrointestinal: Negative for vomiting or other signifcant bowel change Genitourinary: Negative for hematuria or decreased urine volume.  Musculoskeletal: Negative for other MSK pain or swelling Skin: Negative for color change and worsening wound.  Neurological: Negative for tremors and numbness other than noted  Psychiatric/Behavioral: Negative for decreased concentration or agitation other than above       Objective:   Physical Exam BP 114/72  Pulse 96  Temp(Src) 98.4 F (36.9 C) (Oral)  Wt 213 lb 6 oz (96.786 kg)  SpO2 95% VS noted,  Constitutional: Pt appears well-developed, well-nourished.  HENT: Head: NCAT.  Right Ear: External ear normal.  Left Ear: External ear normal.  Eyes: . Pupils are equal, round, and reactive to light. Conjunctivae and EOM are normal Neck: Normal range of motion. Neck supple.  Cardiovascular: Normal rate and regular rhythm.   Pulmonary/Chest: Effort normal and breath sounds normal.  Abd:  Soft, ND, + BS with tender low mid abd without guarding or rebound, no flank tender Neurological: Pt is alert. Not confused , motor grossly intact Skin: Skin is warm. No rash Psychiatric: Pt behavior is normal. No agitation.     Assessment & Plan:

## 2013-10-29 NOTE — Patient Instructions (Signed)
Please take all new medication as prescribed  Please continue all other medications as before, and refills have been done if requested.  Please have the pharmacy call with any other refills you may need.  Please keep your appointments with your specialists as you may have planned     

## 2013-10-30 NOTE — Assessment & Plan Note (Signed)
Mild to mod, for antibx course,  to f/u any worsening symptoms or concerns, and urine studies today - f/u cx results

## 2013-10-30 NOTE — Assessment & Plan Note (Signed)
stable overall by history and exam, recent data reviewed with pt, and pt to continue medical treatment as before,  to f/u any worsening symptoms or concerns BP Readings from Last 3 Encounters:  10/29/13 114/72  06/25/13 118/82  03/25/12 114/72

## 2013-10-30 NOTE — Assessment & Plan Note (Signed)
stable overall by history and exam, recent data reviewed with pt, and pt to continue medical treatment as before,  to f/u any worsening symptoms or concerns Lab Results  Component Value Date   HGBA1C 6.4 06/24/2013

## 2013-10-31 LAB — URINE CULTURE: Colony Count: >=100000

## 2014-01-12 ENCOUNTER — Other Ambulatory Visit (INDEPENDENT_AMBULATORY_CARE_PROVIDER_SITE_OTHER): Payer: Medicare HMO

## 2014-01-12 DIAGNOSIS — E1165 Type 2 diabetes mellitus with hyperglycemia: Secondary | ICD-10-CM

## 2014-01-12 DIAGNOSIS — IMO0002 Reserved for concepts with insufficient information to code with codable children: Secondary | ICD-10-CM

## 2014-01-12 LAB — LIPID PANEL
Cholesterol: 123 mg/dL (ref 0–200)
HDL: 40.3 mg/dL (ref >39.00)
LDL Cholesterol: 58 mg/dL (ref 0–99)
NONHDL: 82.7
Total CHOL/HDL Ratio: 3
Triglycerides: 123 mg/dL (ref 0.0–149.0)
VLDL: 24.6 mg/dL (ref 0.0–40.0)

## 2014-01-12 LAB — HEPATIC FUNCTION PANEL
ALBUMIN: 4.2 g/dL (ref 3.5–5.2)
ALT: 18 U/L (ref 0–35)
AST: 27 U/L (ref 0–37)
Alkaline Phosphatase: 62 U/L (ref 39–117)
Bilirubin, Direct: 0.1 mg/dL (ref 0.0–0.3)
TOTAL PROTEIN: 6.9 g/dL (ref 6.0–8.3)
Total Bilirubin: 1.1 mg/dL (ref 0.2–1.2)

## 2014-01-12 LAB — BASIC METABOLIC PANEL
BUN: 22 mg/dL (ref 6–23)
CALCIUM: 9 mg/dL (ref 8.4–10.5)
CO2: 24 mEq/L (ref 19–32)
Chloride: 103 mEq/L (ref 96–112)
Creatinine, Ser: 0.8 mg/dL (ref 0.4–1.2)
GFR: 72.21 mL/min (ref >60.00)
GLUCOSE: 133 mg/dL — AB (ref 70–99)
Potassium: 4.3 mEq/L (ref 3.5–5.1)
Sodium: 137 mEq/L (ref 135–145)

## 2014-01-12 LAB — HEMOGLOBIN A1C: Hgb A1c MFr Bld: 6.3 % (ref 4.6–6.5)

## 2014-01-13 ENCOUNTER — Ambulatory Visit (INDEPENDENT_AMBULATORY_CARE_PROVIDER_SITE_OTHER): Payer: Medicare HMO | Admitting: Internal Medicine

## 2014-01-13 ENCOUNTER — Encounter: Payer: Self-pay | Admitting: Internal Medicine

## 2014-01-13 VITALS — BP 130/84 | HR 70 | Temp 97.5°F | Ht 68.0 in | Wt 204.0 lb

## 2014-01-13 DIAGNOSIS — E119 Type 2 diabetes mellitus without complications: Secondary | ICD-10-CM

## 2014-01-13 DIAGNOSIS — E785 Hyperlipidemia, unspecified: Secondary | ICD-10-CM

## 2014-01-13 DIAGNOSIS — Z0189 Encounter for other specified special examinations: Secondary | ICD-10-CM

## 2014-01-13 DIAGNOSIS — Z0000 Encounter for general adult medical examination without abnormal findings: Secondary | ICD-10-CM

## 2014-01-13 DIAGNOSIS — I10 Essential (primary) hypertension: Secondary | ICD-10-CM

## 2014-01-13 NOTE — Assessment & Plan Note (Signed)
stable overall by history and exam, recent data reviewed with pt, and pt to continue medical treatment as before,  to f/u any worsening symptoms or concerns BP Readings from Last 3 Encounters:  01/13/14 130/84  10/29/13 114/72  06/25/13 118/82

## 2014-01-13 NOTE — Progress Notes (Signed)
Pre visit review using our clinic review tool, if applicable. No additional management support is needed unless otherwise documented below in the visit note. 

## 2014-01-13 NOTE — Progress Notes (Signed)
Subjective:    Patient ID: [name] [name], female    DOB: 01/13/1946, 68 y.o.   MRN: [id number]  HPI   Here to f/u; overall doing ok,  Pt denies chest pain, increased sob or doe, wheezing, orthopnea, PND, increased LE swelling, palpitations, dizziness or syncope.  Pt denies polydipsia, polyuria, or low sugar symptoms such as weakness or confusion improved with po intake.  Pt denies new neurological symptoms such as new headache, or facial or extremity weakness or numbness.   Pt states overall good compliance with meds, has been trying to follow lower cholesterol diet, with wt overall stable,  but little exercise however. On [place] for wt loss per GYN, lost overall 10 lbs Wt Readings from Last 3 Encounters:  01/13/14 204 lb (92.534 kg)  10/29/13 213 lb 6 oz (96.786 kg)  06/25/13 212 lb 8 oz (96.389 kg)   Past Medical History  Diagnosis Date  . HYPERTENSION 09/10/2007  . HYPERLIPIDEMIA 09/10/2007  . DM 03/26/2008  . DIVERTICULOSIS, COLON 09/10/2007  . Glaucoma 03/17/2011  . Shingles 03/17/2011  . Osteoarthritis 10/21/2011   Past Surgical History  Procedure Laterality Date  . Abdominal hysterectomy    . Tubal ligation      reports that she has never smoked. She does not have any smokeless tobacco history on file. She reports that she drinks alcohol. She reports that she does not use illicit drugs. family history includes Hypertension in her father and mother. Allergies  Allergen Reactions  . Ivp Dye [Iodinated Diagnostic Agents]   . Sulfamethoxazole     REACTION: unspecified   Current Outpatient Prescriptions on File Prior to Visit  Medication Sig Dispense Refill  . Krill Oil (MAXIMUM RED KRILL PO) Take by mouth daily.    [name] losartan (COZAAR) 100 MG tablet Take 1 tablet (100 mg total) by mouth daily. 90 tablet 3  . metFORMIN (GLUCOPHAGE) 500 MG tablet Take 1 tablet (500 mg total) by mouth 2 (two) times daily with a meal. 180 tablet 3  . Multiple Vitamin (MULTIVITAMIN) tablet Take 1 tablet  by mouth daily.    . simvastatin (ZOCOR) 40 MG tablet Take 1 tablet (40 mg total) by mouth daily. 90 tablet 3   No current facility-administered medications on file prior to visit.     Review of Systems  Constitutional: Negative for unusual diaphoresis or other sweats  HENT: Negative for ringing in ear Eyes: Negative for double vision or worsening visual disturbance.  Respiratory: Negative for choking and stridor.   Gastrointestinal: Negative for vomiting or other signifcant bowel change Genitourinary: Negative for hematuria or decreased urine volume.  Musculoskeletal: Negative for other MSK pain or swelling Skin: Negative for color change and worsening wound.  Neurological: Negative for tremors and numbness other than noted  Psychiatric/Behavioral: Negative for decreased concentration or agitation other than above       Objective:   Physical Exam BP 130/84 mmHg  Pulse 70  Temp(Src) 97.5 F (36.4 C) (Oral)  Ht 5\' 8"  (1.727 m)  Wt 204 lb (92.534 kg)  BMI 31.03 kg/m2  SpO2 95% VS noted, not ill appearing Constitutional: Pt appears well-developed, well-nourished.  HENT: Head: NCAT.  Right Ear: External ear normal.  Left Ear: External ear normal.  Eyes: . Pupils are equal, round, and reactive to light. Conjunctivae and EOM are normal Neck: Normal range of motion. Neck supple.  Cardiovascular: Normal rate and regular rhythm.   Pulmonary/Chest: Effort normal and breath sounds normal.  Neurological: Pt is alert. Not  confused , motor grossly intact Skin: Skin is warm. No rash Psychiatric: Pt behavior is normal. No agitation.     Assessment & Plan:

## 2014-01-13 NOTE — Patient Instructions (Signed)
Please continue all other medications as before, and refills have been done if requested.  Please have the pharmacy call with any other refills you may need.  Please continue your efforts at being more active, low cholesterol diet, and weight control.  You are otherwise up to date with prevention measures today.  Please keep your appointments with your specialists as you may have planned  Please return in 6 months, or sooner if needed, with Lab testing done 3-5 days before  

## 2014-01-13 NOTE — Assessment & Plan Note (Signed)
stable overall by history and exam, recent data reviewed with pt, and pt to continue medical treatment as before,  to f/u any worsening symptoms or concerns Lab Results  Component Value Date   LDLCALC 58 01/12/2014

## 2014-01-13 NOTE — Assessment & Plan Note (Signed)
stable overall by history and exam, recent data reviewed with pt, and pt to continue medical treatment as before,  to f/u any worsening symptoms or concerns Lab Results  Component Value Date   HGBA1C 6.3 01/12/2014

## 2014-02-11 ENCOUNTER — Other Ambulatory Visit: Payer: Self-pay | Admitting: Obstetrics & Gynecology

## 2014-05-06 ENCOUNTER — Other Ambulatory Visit: Payer: Self-pay | Admitting: Internal Medicine

## 2014-05-06 DIAGNOSIS — Z1231 Encounter for screening mammogram for malignant neoplasm of breast: Secondary | ICD-10-CM

## 2014-05-14 ENCOUNTER — Ambulatory Visit (HOSPITAL_COMMUNITY)
Admission: RE | Admit: 2014-05-14 | Discharge: 2014-05-14 | Disposition: A | Discharge status: Home / Self Care | Payer: PPO | Source: Ambulatory Visit | Attending: Internal Medicine | Admitting: Internal Medicine

## 2014-05-14 DIAGNOSIS — Z1231 Encounter for screening mammogram for malignant neoplasm of breast: Secondary | ICD-10-CM | POA: Diagnosis not present

## 2014-07-01 ENCOUNTER — Other Ambulatory Visit: Payer: Self-pay | Admitting: Internal Medicine

## 2014-07-15 ENCOUNTER — Ambulatory Visit: Payer: Medicare HMO | Admitting: Internal Medicine

## 2014-07-20 ENCOUNTER — Telehealth: Payer: Self-pay

## 2014-07-20 NOTE — Telephone Encounter (Signed)
Call and LVM to schedule AWV prior to apt with Dr. Jenny Reichmann on the 24th of June To note, I have other AWV at 8am on the 24th, so would not be available until after her apt with Dr. Jenny Reichmann if on the 24th.

## 2014-07-30 ENCOUNTER — Ambulatory Visit: Payer: Medicare HMO | Admitting: Internal Medicine

## 2015-02-10 DIAGNOSIS — J018 Other acute sinusitis: Secondary | ICD-10-CM | POA: Diagnosis not present

## 2015-04-20 DIAGNOSIS — H40013 Open angle with borderline findings, low risk, bilateral: Secondary | ICD-10-CM | POA: Diagnosis not present

## 2015-04-20 DIAGNOSIS — H43811 Vitreous degeneration, right eye: Secondary | ICD-10-CM | POA: Diagnosis not present

## 2015-05-17 ENCOUNTER — Encounter: Payer: Self-pay | Admitting: Gastroenterology

## 2015-05-18 DIAGNOSIS — H43811 Vitreous degeneration, right eye: Secondary | ICD-10-CM | POA: Diagnosis not present

## 2015-05-23 DIAGNOSIS — J029 Acute pharyngitis, unspecified: Secondary | ICD-10-CM | POA: Diagnosis not present

## 2015-06-03 ENCOUNTER — Telehealth: Payer: Self-pay | Admitting: Internal Medicine

## 2015-06-03 DIAGNOSIS — Z0001 Encounter for general adult medical examination with abnormal findings: Secondary | ICD-10-CM

## 2015-06-03 DIAGNOSIS — E1140 Type 2 diabetes mellitus with diabetic neuropathy, unspecified: Secondary | ICD-10-CM

## 2015-06-03 DIAGNOSIS — Z794 Long term (current) use of insulin: Secondary | ICD-10-CM

## 2015-06-03 NOTE — Telephone Encounter (Signed)
Fuig for labs  But let pt know that if she has traditional medicare (not [employer] medicare or [employer]) she may liable for the bill  I can no longer tell from the chart about the current insurance

## 2015-06-03 NOTE — Telephone Encounter (Signed)
Patient has scheduled appt for 5/31 for CPE.  She is requesting labs to be entered before hand.  Please follow up in regards.

## 2015-06-03 NOTE — Telephone Encounter (Signed)
Please advise 

## 2015-06-06 NOTE — Telephone Encounter (Signed)
Patient called back. I informed her about the lab work.

## 2015-06-06 NOTE — Telephone Encounter (Signed)
Unable to reach patient, left message to give [place] a call back about lab work

## 2015-06-07 DIAGNOSIS — L739 Follicular disorder, unspecified: Secondary | ICD-10-CM | POA: Diagnosis not present

## 2015-06-07 DIAGNOSIS — D485 Neoplasm of uncertain behavior of skin: Secondary | ICD-10-CM | POA: Diagnosis not present

## 2015-06-07 DIAGNOSIS — L821 Other seborrheic keratosis: Secondary | ICD-10-CM | POA: Diagnosis not present

## 2015-06-07 DIAGNOSIS — D1801 Hemangioma of skin and subcutaneous tissue: Secondary | ICD-10-CM | POA: Diagnosis not present

## 2015-06-07 DIAGNOSIS — L814 Other melanin hyperpigmentation: Secondary | ICD-10-CM | POA: Diagnosis not present

## 2015-06-07 DIAGNOSIS — C44519 Basal cell carcinoma of skin of other part of trunk: Secondary | ICD-10-CM | POA: Diagnosis not present

## 2015-06-07 DIAGNOSIS — L570 Actinic keratosis: Secondary | ICD-10-CM | POA: Diagnosis not present

## 2015-06-28 ENCOUNTER — Other Ambulatory Visit: Payer: Self-pay | Admitting: Obstetrics & Gynecology

## 2015-06-28 DIAGNOSIS — Z1231 Encounter for screening mammogram for malignant neoplasm of breast: Secondary | ICD-10-CM | POA: Diagnosis not present

## 2015-06-28 DIAGNOSIS — Z1272 Encounter for screening for malignant neoplasm of vagina: Secondary | ICD-10-CM | POA: Diagnosis not present

## 2015-06-28 DIAGNOSIS — Z01419 Encounter for gynecological examination (general) (routine) without abnormal findings: Secondary | ICD-10-CM | POA: Diagnosis not present

## 2015-06-29 DIAGNOSIS — N890 Mild vaginal dysplasia: Secondary | ICD-10-CM | POA: Insufficient documentation

## 2015-06-29 DIAGNOSIS — C4491 Basal cell carcinoma of skin, unspecified: Secondary | ICD-10-CM | POA: Insufficient documentation

## 2015-06-29 LAB — CYTOLOGY - PAP

## 2015-07-05 ENCOUNTER — Other Ambulatory Visit (INDEPENDENT_AMBULATORY_CARE_PROVIDER_SITE_OTHER): Payer: PPO

## 2015-07-05 DIAGNOSIS — E1140 Type 2 diabetes mellitus with diabetic neuropathy, unspecified: Secondary | ICD-10-CM | POA: Diagnosis not present

## 2015-07-05 DIAGNOSIS — Z794 Long term (current) use of insulin: Secondary | ICD-10-CM

## 2015-07-05 DIAGNOSIS — R6889 Other general symptoms and signs: Secondary | ICD-10-CM

## 2015-07-05 DIAGNOSIS — Z0001 Encounter for general adult medical examination with abnormal findings: Secondary | ICD-10-CM | POA: Diagnosis not present

## 2015-07-05 LAB — HEPATIC FUNCTION PANEL
ALT: 19 U/L (ref 0–35)
AST: 24 U/L (ref 0–37)
Albumin: 4.5 g/dL (ref 3.5–5.2)
Alkaline Phosphatase: 63 U/L (ref 39–117)
BILIRUBIN DIRECT: 0.2 mg/dL (ref 0.0–0.3)
BILIRUBIN TOTAL: 1.1 mg/dL (ref 0.2–1.2)
Total Protein: 7.3 g/dL (ref 6.0–8.3)

## 2015-07-05 LAB — CBC WITH DIFFERENTIAL/PLATELET
BASOS PCT: 0.8 % (ref 0.0–3.0)
Basophils Absolute: 0.1 10*3/uL (ref 0.0–0.1)
EOS PCT: 2.4 % (ref 0.0–5.0)
Eosinophils Absolute: 0.3 10*3/uL (ref 0.0–0.7)
HEMATOCRIT: 47.3 % — AB (ref 36.0–46.0)
Hemoglobin: 16.2 g/dL — ABNORMAL HIGH (ref 12.0–15.0)
LYMPHS PCT: 29.4 % (ref 12.0–46.0)
Lymphs Abs: 3.1 10*3/uL (ref 0.7–4.0)
MCHC: 34.2 g/dL (ref 30.0–36.0)
MCV: 91.1 fl (ref 78.0–100.0)
MONOS PCT: 6.2 % (ref 3.0–12.0)
Monocytes Absolute: 0.7 10*3/uL (ref 0.1–1.0)
NEUTROS ABS: 6.5 10*3/uL (ref 1.4–7.7)
Neutrophils Relative %: 61.2 % (ref 43.0–77.0)
Platelets: 324 10*3/uL (ref 150.0–400.0)
RBC: 5.19 Mil/uL — ABNORMAL HIGH (ref 3.87–5.11)
RDW: 13.3 % (ref 11.5–15.5)
WBC: 10.7 10*3/uL — ABNORMAL HIGH (ref 4.0–10.5)

## 2015-07-05 LAB — BASIC METABOLIC PANEL
BUN: 27 mg/dL — ABNORMAL HIGH (ref 6–23)
CALCIUM: 9.8 mg/dL (ref 8.4–10.5)
CO2: 24 mEq/L (ref 19–32)
CREATININE: 0.86 mg/dL (ref 0.40–1.20)
Chloride: 104 mEq/L (ref 96–112)
GFR: 69.02 mL/min (ref >60.00)
Glucose, Bld: 147 mg/dL — ABNORMAL HIGH (ref 70–99)
Potassium: 4.9 mEq/L (ref 3.5–5.1)
Sodium: 138 mEq/L (ref 135–145)

## 2015-07-05 LAB — URINALYSIS, ROUTINE W REFLEX MICROSCOPIC
BILIRUBIN URINE: NEGATIVE
HGB URINE DIPSTICK: NEGATIVE
KETONES UR: NEGATIVE
LEUKOCYTES UA: NEGATIVE
NITRITE: NEGATIVE
RBC / HPF: NONE SEEN (ref 0–?)
Specific Gravity, Urine: <=1.005 — AB (ref 1.000–1.030)
Total Protein, Urine: NEGATIVE
URINE GLUCOSE: NEGATIVE
UROBILINOGEN UA: 0.2 (ref 0.0–1.0)
pH: 6 (ref 5.0–8.0)

## 2015-07-05 LAB — LIPID PANEL
CHOL/HDL RATIO: 3
Cholesterol: 137 mg/dL (ref 0–200)
HDL: 43.1 mg/dL (ref >39.00)
LDL Cholesterol: 55 mg/dL (ref 0–99)
NONHDL: 93.84
Triglycerides: 196 mg/dL — ABNORMAL HIGH (ref 0.0–149.0)
VLDL: 39.2 mg/dL (ref 0.0–40.0)

## 2015-07-05 LAB — MICROALBUMIN / CREATININE URINE RATIO
Creatinine,U: 45.8 mg/dL
Microalb Creat Ratio: 1.5 mg/g (ref 0.0–30.0)

## 2015-07-05 LAB — TSH: TSH: 2.66 u[IU]/mL (ref 0.35–4.50)

## 2015-07-05 LAB — HEMOGLOBIN A1C: Hgb A1c MFr Bld: 6.9 % — ABNORMAL HIGH (ref 4.6–6.5)

## 2015-07-06 ENCOUNTER — Encounter: Payer: Self-pay | Admitting: Internal Medicine

## 2015-07-06 ENCOUNTER — Ambulatory Visit (INDEPENDENT_AMBULATORY_CARE_PROVIDER_SITE_OTHER): Payer: PPO | Admitting: Internal Medicine

## 2015-07-06 ENCOUNTER — Ambulatory Visit (INDEPENDENT_AMBULATORY_CARE_PROVIDER_SITE_OTHER)
Admission: RE | Admit: 2015-07-06 | Discharge: 2015-07-06 | Disposition: A | Discharge status: Home / Self Care | Payer: PPO | Source: Ambulatory Visit | Attending: Internal Medicine | Admitting: Internal Medicine

## 2015-07-06 VITALS — BP 130/70 | HR 92 | Temp 98.0°F | Resp 20 | Wt 207.0 lb

## 2015-07-06 DIAGNOSIS — Z0001 Encounter for general adult medical examination with abnormal findings: Secondary | ICD-10-CM

## 2015-07-06 DIAGNOSIS — R6889 Other general symptoms and signs: Secondary | ICD-10-CM

## 2015-07-06 DIAGNOSIS — M25561 Pain in right knee: Secondary | ICD-10-CM

## 2015-07-06 DIAGNOSIS — M8580 Other specified disorders of bone density and structure, unspecified site: Secondary | ICD-10-CM

## 2015-07-06 DIAGNOSIS — E785 Hyperlipidemia, unspecified: Secondary | ICD-10-CM | POA: Diagnosis not present

## 2015-07-06 DIAGNOSIS — I10 Essential (primary) hypertension: Secondary | ICD-10-CM | POA: Diagnosis not present

## 2015-07-06 DIAGNOSIS — E119 Type 2 diabetes mellitus without complications: Secondary | ICD-10-CM | POA: Diagnosis not present

## 2015-07-06 DIAGNOSIS — Z1159 Encounter for screening for other viral diseases: Secondary | ICD-10-CM | POA: Diagnosis not present

## 2015-07-06 DIAGNOSIS — Z1211 Encounter for screening for malignant neoplasm of colon: Secondary | ICD-10-CM

## 2015-07-06 DIAGNOSIS — M25569 Pain in unspecified knee: Secondary | ICD-10-CM | POA: Insufficient documentation

## 2015-07-06 DIAGNOSIS — Z0000 Encounter for general adult medical examination without abnormal findings: Secondary | ICD-10-CM

## 2015-07-06 DIAGNOSIS — M25562 Pain in left knee: Secondary | ICD-10-CM

## 2015-07-06 MED ORDER — LOSARTAN POTASSIUM 100 MG PO TABS: ORAL_TABLET | ORAL | Status: DC

## 2015-07-06 MED ORDER — SIMVASTATIN 40 MG PO TABS: ORAL_TABLET | ORAL | Status: DC

## 2015-07-06 MED ORDER — MELOXICAM 15 MG PO TABS: 15.0000 mg | ORAL_TABLET | Freq: Every day | ORAL | Status: DC

## 2015-07-06 MED ORDER — METFORMIN HCL 500 MG PO TABS: ORAL_TABLET | ORAL | Status: DC

## 2015-07-06 NOTE — Progress Notes (Signed)
Pre visit review using our clinic review tool, if applicable. No additional management support is needed unless otherwise documented below in the visit note. 

## 2015-07-06 NOTE — Patient Instructions (Addendum)
You had the tetanus shot today  Please take all new medication as prescribed - the anti-inflammatory if needed for pain  Please continue all other medications as before, and refills have been done if requested.  Please have the pharmacy call with any other refills you may need.  Please continue your efforts at being more active, low cholesterol diet, and weight control.  You are otherwise up to date with prevention measures today.  Please keep your appointments with your specialists as you may have planned  You will be contacted regarding the referral for: colonoscopy  Please make an appt with Dr Tamala Julian for your knees as you leave at the scheduling desk  Please schedule the bone density test before leaving today at the scheduling desk (where you check out)  Please return in 6 months, or sooner if needed, with Lab testing done 3-5 days before

## 2015-07-06 NOTE — Assessment & Plan Note (Signed)
Lab Results  Component Value Date   LDLCALC 55 07/05/2015   stable overall by history and exam, recent data reviewed with pt, and pt to continue medical treatment as before,  to f/u any worsening symptoms or concerns

## 2015-07-06 NOTE — Progress Notes (Signed)
t  Subjective:    Patient ID: [name] [name], female    DOB: 01/22/1946, 70 y.o.   MRN: [id number]  HPI  Here for wellness and f/u;  Overall doing ok;  Pt denies Chest pain, worsening SOB, DOE, wheezing, orthopnea, PND, worsening LE edema, palpitations, dizziness or syncope.  Pt denies neurological change such as new headache, facial or extremity weakness.  Pt denies polydipsia, polyuria, or low sugar symptoms. Pt states overall good compliance with treatment and medications, good tolerability, and has been trying to follow appropriate diet.  Pt denies worsening depressive symptoms, suicidal ideation or panic. No fever, night sweats, wt loss, loss of appetite, or other constitutional symptoms.  Pt states good ability with ADL's, has low fall risk, home safety reviewed and adequate, no other significant changes in hearing or vision, and only occasionally active with exercise.  Has had worsening bilat knee pain now moderate but getting by with tylenol prn, sometimes can to several days without that even.  Has been less active over the past yr, but did join the gym again last wk, trying to do better, not sure how the knees are going to hold up  No swelling, giveaways. Also pt isTaking the regular metformin at 2 qhs, instead of 1 bid b/c hard to remember, but will to change to bid. Wt Readings from Last 3 Encounters:  07/06/15 207 lb (93.895 kg)  01/13/14 204 lb (92.534 kg)  10/29/13 213 lb 6 oz (96.786 kg)   Past Medical History  Diagnosis Date  . HYPERTENSION 09/10/2007  . HYPERLIPIDEMIA 09/10/2007  . DM 03/26/2008  . DIVERTICULOSIS, COLON 09/10/2007  . Glaucoma 03/17/2011  . Shingles 03/17/2011  . Osteoarthritis 10/21/2011   Past Surgical History  Procedure Laterality Date  . Abdominal hysterectomy    . Tubal ligation      reports that she has never smoked. She does not have any smokeless tobacco history on file. She reports that she drinks alcohol. She reports that she does not use illicit  drugs. family history includes Hypertension in her father and mother. Allergies  Allergen Reactions  . Ivp Dye [Iodinated Diagnostic Agents]   . Sulfamethoxazole     REACTION: unspecified   Current Outpatient Prescriptions on File Prior to Visit  Medication Sig Dispense Refill  . Krill Oil (MAXIMUM RED KRILL PO) Take by mouth daily.    . Multiple Vitamin (MULTIVITAMIN) tablet Take 1 tablet by mouth daily.     No current facility-administered medications on file prior to visit.   Review of Systems Constitutional: Negative for increased diaphoresis, or other activity, appetite or siginficant weight change other than noted HENT: Negative for worsening hearing loss, ear pain, facial swelling, mouth sores and neck stiffness.   Eyes: Negative for other worsening pain, redness or visual disturbance.  Respiratory: Negative for choking or stridor Cardiovascular: Negative for other chest pain and palpitations.  Gastrointestinal: Negative for worsening diarrhea, blood in stool, or abdominal distention Genitourinary: Negative for hematuria, flank pain or change in urine volume.  Musculoskeletal: Negative for myalgias or other joint complaints.  Skin: Negative for other color change and wound or drainage.  Neurological: Negative for syncope and numbness. other than noted Hematological: Negative for adenopathy. or other swelling Psychiatric/Behavioral: Negative for hallucinations, SI, self-injury, decreased concentration or other worsening agitation.      Objective:   Physical Exam BP 130/70 mmHg  Pulse 92  Temp(Src) 98 F (36.7 C) (Oral)  Resp 20  Wt 207 lb (93.895 kg)  SpO2  94% VS noted,  Constitutional: Pt is oriented to person, place, and time. Appears well-developed and well-nourished, in no significant distress Head: Normocephalic and atraumatic  Eyes: Conjunctivae and EOM are normal. Pupils are equal, round, and reactive to light Right Ear: External ear normal.  Left Ear:  External ear normal Nose: Nose normal.  Mouth/Throat: Oropharynx is clear and moist  Neck: Normal range of motion. Neck supple. No JVD present. No tracheal deviation present or significant neck LA or mass Cardiovascular: Normal rate, regular rhythm, normal heart sounds and intact distal pulses.   Pulmonary/Chest: Effort normal and breath sounds without rales or wheezing  Abdominal: Soft. Bowel sounds are normal. NT. No HSM  Musculoskeletal: Normal range of motion. Exhibits no edema Lymphadenopathy: Has no cervical adenopathy.  Neurological: Pt is alert and oriented to person, place, and time. Pt has normal reflexes. No cranial nerve deficit. Motor grossly intact Skin: Skin is warm and dry. No rash noted or new ulcers Psychiatric:  Has normal mood and affect. Behavior is normal. Bilat knees with trace effusion wiuth crepitus and mild reduced ROM left > right     Assessment & Plan:

## 2015-07-06 NOTE — Assessment & Plan Note (Signed)

## 2015-07-06 NOTE — Assessment & Plan Note (Signed)
stable overall by history and exam, recent data reviewed with pt, and pt to continue medical treatment as before,  to f/u any worsening symptoms or concerns BP Readings from Last 3 Encounters:  07/06/15 130/70  01/13/14 130/84  10/29/13 114/72

## 2015-07-06 NOTE — Assessment & Plan Note (Addendum)
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

## 2015-07-06 NOTE — Assessment & Plan Note (Signed)
Lab Results  Component Value Date   HGBA1C 6.9* 07/05/2015   Mild worsening recently, asked pt to take the metformin as prescribed at 500 bid and she agrees, to work on diet and activity as well, o/w stable overall by history and exam, recent data reviewed with pt, and pt to continue medical treatment as before,  to f/u any worsening symptoms or concerns

## 2015-07-12 ENCOUNTER — Encounter: Payer: Self-pay | Admitting: Gastroenterology

## 2015-07-20 ENCOUNTER — Ambulatory Visit (INDEPENDENT_AMBULATORY_CARE_PROVIDER_SITE_OTHER): Payer: PPO | Admitting: Family Medicine

## 2015-07-20 ENCOUNTER — Ambulatory Visit (INDEPENDENT_AMBULATORY_CARE_PROVIDER_SITE_OTHER)
Admission: RE | Admit: 2015-07-20 | Discharge: 2015-07-20 | Disposition: A | Discharge status: Home / Self Care | Payer: PPO | Source: Ambulatory Visit | Attending: Family Medicine | Admitting: Family Medicine

## 2015-07-20 ENCOUNTER — Encounter: Payer: Self-pay | Admitting: Family Medicine

## 2015-07-20 VITALS — BP 122/84 | HR 90 | Ht 68.0 in | Wt 207.0 lb

## 2015-07-20 DIAGNOSIS — M13169 Monoarthritis, not elsewhere classified, unspecified knee: Secondary | ICD-10-CM | POA: Diagnosis not present

## 2015-07-20 DIAGNOSIS — M25561 Pain in right knee: Secondary | ICD-10-CM

## 2015-07-20 DIAGNOSIS — M25562 Pain in left knee: Secondary | ICD-10-CM

## 2015-07-20 DIAGNOSIS — M1710 Unilateral primary osteoarthritis, unspecified knee: Secondary | ICD-10-CM | POA: Insufficient documentation

## 2015-07-20 DIAGNOSIS — M170 Bilateral primary osteoarthritis of knee: Secondary | ICD-10-CM | POA: Diagnosis not present

## 2015-07-20 NOTE — Assessment & Plan Note (Signed)
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 We discussed different treatment options. X-rays are pending. I do believe patient will do fairly well with conservative therapy. Patient given recommendation to over-the-counter medications given trial topical anti-inflammatories. Work with Product/process development scientist to learn home exercises in greater detail. We discussed which activities potentially avoid. Encourage patient to wear proper shoes as well as encourage weight loss. Patient will come back and see me again in 4 weeks. At that time if continuing have some pain we will consider injections and possibly formal physical therapy.

## 2015-07-20 NOTE — Patient Instructions (Signed)
Good to see yo u Xrays downstairs today  Ice 20 minutes 2 times daily. Usually after activity and before bed. Exercises 3 times a week.  pennsaid pinkie amount topically 2 times daily as needed.  Take tylenol 650 mg three times a day is the best evidence based medicine we have for arthritis. . Vitamin D 2000 IU daily Tart cherry extract anydose at night  Tumeric 500mg  twice daily.  Cortisone injections are an option if these interventions do not seem to make a difference or need more relief.  If cortisone injections do not help, there are different types of shots that may help but they take longer to take effect.  We can discuss this at follow up.  It's important that you continue to stay active. Controlling your weight is important.  Good shoes with rigid bottom.  Jalene Mullet, Merrell or New balance greater then 700 Shoe inserts with good arch support may be helpful.  Spenco orthotics "total support" online would be great  Water aerobics and cycling with low resistance are the best two types of exercise for arthritis. Come back and see me in 4 weeks.

## 2015-07-20 NOTE — Progress Notes (Signed)
[name] [place], [place] [address] Phone: [phone] Subjective:    I'm seeing this patient by the request  of:  Cathlean Cower, MD   CC: Bilateral knee pain  [id number] [name] [name] is a 70 y.o. female coming in with complaint of bilateral knee pain. Has had this pain for multiple years. Patient states it is more of a dull, throbbing aching sensation. Seems to be on the anterior aspect of the knee. Worse with going up and downstairs. Denies any instability. Possible some swelling. Radiation numbness or weakness. Patient rates the severity of pain is 8 out of 10 with severe but has a constant dull aching 2 out of 10 most days. Patient is motivated to get back into the gym to lose some weight but wants to make sure she does not cause worsening symptoms of her knees. Does respond the Tylenol.     Past Medical History  Diagnosis Date  . HYPERTENSION 09/10/2007  . HYPERLIPIDEMIA 09/10/2007  . DM 03/26/2008  . DIVERTICULOSIS, COLON 09/10/2007  . Glaucoma 03/17/2011  . Shingles 03/17/2011  . Osteoarthritis 10/21/2011   Past Surgical History  Procedure Laterality Date  . Abdominal hysterectomy    . Tubal ligation     Social History   Social History  . Marital Status: Married    Spouse Name: N/A  . Number of Children: N/A  . Years of Education: N/A   Social History Main Topics  . Smoking status: Never Smoker   . Smokeless tobacco: Not on file  . Alcohol Use: Yes     Comment: social  . Drug Use: No  . Sexual Activity: Not on file   Other Topics Concern  . Not on file   Social History Narrative   Allergies  Allergen Reactions  . Ivp Dye [Iodinated Diagnostic Agents]   . Sulfamethoxazole     REACTION: unspecified   Family History  Problem Relation Age of Onset  . Hypertension Mother   . Hypertension Father     Past medical history, social, surgical and family history all reviewed in electronic medical record.  No pertanent  information unless stated regarding to the chief complaint.   Review of Systems: No headache, visual changes, nausea, vomiting, diarrhea, constipation, dizziness, abdominal pain, skin rash, fevers, chills, night sweats, weight loss, swollen lymph nodes, body aches, joint swelling, muscle aches, chest pain, shortness of breath, mood changes.   Objective Blood pressure 122/84, pulse 90, weight 207 lb (93.895 kg), SpO2 97 %.  General: No apparent distress alert and oriented x3 mood and affect normal, dressed appropriately.  HEENT: Pupils equal, extraocular movements intact  Respiratory: Patient's speak in full sentences and does not appear short of breath  Cardiovascular: No lower extremity edema, non tender, no erythema  Skin: Warm dry intact with no signs of infection or rash on extremities or on axial skeleton.  Abdomen: Soft nontender  Neuro: Cranial nerves II through XII are intact, neurovascularly intact in all extremities with 2+ DTRs and 2+ pulses.  Lymph: No lymphadenopathy of posterior or anterior cervical chain or axillae bilaterally.  Gait normal with good balance and coordination.  MSK:  Non tender with full range of motion and good stability and symmetric strength and tone of shoulders, elbows, wrist, hip, and ankles bilaterally.  Knee: Bilateral Mild lateral tilt of the patella bilaterally Tender over the patellofemoral joint bilaterally ROM full in flexion and extension and lower leg rotation. Does have moderate crepitus with  movement Ligaments with solid consistent endpoints including ACL, PCL, LCL, MCL. painful patellar compression. Patellar glide with moderate to severe crepitus. Patellar and quadriceps tendons unremarkable. Hamstring and quadriceps strength is normal.   Procedure note [id number]; 15 minutes spent for Therapeutic exercises as stated in above notes.  This included exercises focusing on stretching, strengthening, with significant focus on eccentric aspects.   Flexion and extension exercises working on hip abductor's, vastus medialis oblique, as well as hamstring strengthening. Proper technique shown and discussed handout in great detail with ATC.  All questions were discussed and answered.     Impression and Recommendations:     This case required medical decision making of moderate complexity.      Note: This dictation was prepared with Dragon dictation along with smaller phrase technology. Any transcriptional errors that result from this process are unintentional.

## 2015-07-20 NOTE — Progress Notes (Signed)
Pre visit review using our clinic review tool, if applicable. No additional management support is needed unless otherwise documented below in the visit note. 

## 2015-08-11 DIAGNOSIS — C44519 Basal cell carcinoma of skin of other part of trunk: Secondary | ICD-10-CM | POA: Diagnosis not present

## 2015-08-17 ENCOUNTER — Encounter: Payer: Self-pay | Admitting: Family Medicine

## 2015-08-17 ENCOUNTER — Ambulatory Visit (INDEPENDENT_AMBULATORY_CARE_PROVIDER_SITE_OTHER): Payer: PPO | Admitting: Family Medicine

## 2015-08-17 ENCOUNTER — Telehealth: Payer: Self-pay | Admitting: Internal Medicine

## 2015-08-17 VITALS — BP 126/84 | HR 87 | Ht 68.0 in | Wt 206.0 lb

## 2015-08-17 DIAGNOSIS — M13169 Monoarthritis, not elsewhere classified, unspecified knee: Secondary | ICD-10-CM

## 2015-08-17 DIAGNOSIS — M1710 Unilateral primary osteoarthritis, unspecified knee: Secondary | ICD-10-CM | POA: Insufficient documentation

## 2015-08-17 DIAGNOSIS — M170 Bilateral primary osteoarthritis of knee: Secondary | ICD-10-CM

## 2015-08-17 NOTE — Patient Instructions (Signed)
Good to see you  Ice is your friend Try to do some of the exercises when you get back to your routine Injected both knees See me again in 3 weeks and if not better we will start orthovisc.

## 2015-08-17 NOTE — Progress Notes (Signed)
Tawana ScaleZach Miaisabella [name] D.O. [facility] [address] [place], [place] [address] Phone: [phone] Subjective:    I'm seeing this patient by the request  of:  Oliver BarreJames John, MD   CC: Bilateral knee pain f/u  NGE:[id number]:Subjective [name] FlockMartha F [name] is a 70 y.o. female coming in with complaint of bilateral knee pain. 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 Patient's x-rays were independently visualized by me. Patient was to try conservative therapy including home exercises, icing protocol, oral anti-inflammatories as well as some over-the-counter topical medications. Patient states very minimal improvement of a proximal length 5-10%. Still affecting daily activities. Patient is attempting to lose weight but finds it difficult to be active secondary to the discomfort.     Past Medical History  Diagnosis Date  . HYPERTENSION 09/10/2007  . HYPERLIPIDEMIA 09/10/2007  . DM 03/26/2008  . DIVERTICULOSIS, COLON 09/10/2007  . Glaucoma 03/17/2011  . Shingles 03/17/2011  . Osteoarthritis 10/21/2011   Past Surgical History  Procedure Laterality Date  . Abdominal hysterectomy    . Tubal ligation     Social History   Social History  . Marital Status: Married    Spouse Name: N/A  . Number of Children: N/A  . Years of Education: N/A   Social History Main Topics  . Smoking status: Never Smoker   . Smokeless tobacco: Not on file  . Alcohol Use: Yes     Comment: social  . Drug Use: No  . Sexual Activity: Not on file   Other Topics Concern  . Not on file   Social History Narrative   Allergies  Allergen Reactions  . Ivp Dye [Iodinated Diagnostic Agents]   . Sulfamethoxazole     REACTION: unspecified   Family History  Problem Relation Age of Onset  . Hypertension Mother   . Hypertension Father     Past medical history, social, surgical and family history all reviewed in electronic medical record.  No pertanent information unless stated regarding to the chief  complaint.   Review of Systems: No headache, visual changes, nausea, vomiting, diarrhea, constipation, dizziness, abdominal pain, skin rash, fevers, chills, night sweats, weight loss, swollen lymph nodes, body aches, joint swelling, muscle aches, chest pain, shortness of breath, mood changes.   Objective There were no vitals taken for this visit.  General: No apparent distress alert and oriented x3 mood and affect normal, dressed appropriately.  HEENT: Pupils equal, extraocular movements intact  Respiratory: Patient's speak in full sentences and does not appear short of breath  Cardiovascular: No lower extremity edema, non tender, no erythema  Skin: Warm dry intact with no signs of infection or rash on extremities or on axial skeleton.  Abdomen: Soft nontender  Neuro: Cranial nerves II through XII are intact, neurovascularly intact in all extremities with 2+ DTRs and 2+ pulses.  Lymph: No lymphadenopathy of posterior or anterior cervical chain or axillae bilaterally.  Gait normal with good balance and coordination.  MSK:  Non tender with full range of motion and good stability and symmetric strength and tone of shoulders, elbows, wrist, hip, and ankles bilaterally.  Knee: Bilateral Mild lateral tilt of the patella bilaterally Tender over the patellofemoral joint bilaterally ROM full in flexion and extension and lower leg rotation. Does have moderate crepitus with movement Mild instability with valgus force bilaterally painful patellar compression. Patellar glide with moderate to severe crepitus. Patellar and quadriceps tendons unremarkable. Hamstring and quadriceps strength is normal.  Change from previous exam .worse  After informed written and verbal consent, patient was seated on exam table. Right knee was prepped with alcohol swab and utilizing anterolateral approach, patient's right knee space was injected with 4:1  marcaine 0.5%: Kenalog 40mg /dL. Patient tolerated the procedure well  without immediate complications.  After informed written and verbal consent, patient was seated on exam table. Left knee was prepped with alcohol swab and utilizing anterolateral approach, patient's left knee space was injected with 4:1  marcaine 0.5%: Kenalog 40mg /dL. Patient tolerated the procedure well without immediate complications.    Impression and Recommendations:     This case required medical decision making of moderate complexity.      Note: This dictation was prepared with Dragon dictation along with smaller phrase technology. Any transcriptional errors that result from this process are unintentional.

## 2015-08-17 NOTE — Assessment & Plan Note (Addendum)
Patient does have severe osteophytic changes of the knees bilaterally. Does not want any type of surgical intervention. Patient was given steroid injection and did respond very well. Encourage patient to continue to stay active. We discussed icing regimen again. Patient has started failed formal physical therapy previously. Patient will come back again in 3-4 weeks. At that time if worsening symptoms we'll consider viscous supplementation.  Spent  25 minutes with patient face-to-face and had greater than 50% of counseling including as described above in assessment and plan.

## 2015-08-18 DIAGNOSIS — H40053 Ocular hypertension, bilateral: Secondary | ICD-10-CM | POA: Diagnosis not present

## 2015-08-29 ENCOUNTER — Encounter: Payer: Self-pay | Admitting: Family Medicine

## 2015-08-29 ENCOUNTER — Ambulatory Visit (INDEPENDENT_AMBULATORY_CARE_PROVIDER_SITE_OTHER): Payer: PPO | Admitting: Family Medicine

## 2015-08-29 DIAGNOSIS — M170 Bilateral primary osteoarthritis of knee: Secondary | ICD-10-CM | POA: Diagnosis not present

## 2015-08-29 MED ORDER — PREDNISONE 50 MG PO TABS: 50.0000 mg | ORAL_TABLET | Freq: Every day | ORAL | 0 refills | Status: DC

## 2015-08-29 NOTE — Progress Notes (Signed)
[name] [place], [place] [address] Phone: [phone] Subjective:    I'm seeing this patient by the request  of:  Cathlean Cower, MD   CC: Bilateral knee pain f/u  [id number]  [name] [name] is a 70 y.o. female coming in with complaint of bilateral knee pain. 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 Patient's x-rays were independently visualized by me.  Patient was given injections at last follow-up. Patient states that she is 95% better. Very minimal discomfort at all. States that only going upstairs that she notices it. Overall very happy with the results.     Past Medical History:  Diagnosis Date  . DIVERTICULOSIS, COLON 09/10/2007  . DM 03/26/2008  . Glaucoma 03/17/2011  . HYPERLIPIDEMIA 09/10/2007  . HYPERTENSION 09/10/2007  . Osteoarthritis 10/21/2011  . Shingles 03/17/2011   Past Surgical History:  Procedure Laterality Date  . ABDOMINAL HYSTERECTOMY    . TUBAL LIGATION     Social History   Social History  . Marital status: Married    Spouse name: N/A  . Number of children: N/A  . Years of education: N/A   Social History Main Topics  . Smoking status: Never Smoker  . Smokeless tobacco: None  . Alcohol use Yes     Comment: social  . Drug use: No  . Sexual activity: Not Asked   Other Topics Concern  . None   Social History Narrative  . None   Allergies  Allergen Reactions  . Ivp Dye [Iodinated Diagnostic Agents]   . Sulfamethoxazole     REACTION: unspecified   Family History  Problem Relation Age of Onset  . Hypertension Mother   . Hypertension Father     Past medical history, social, surgical and family history all reviewed in electronic medical record.  No pertanent information unless stated regarding to the chief complaint.   Review of Systems: No headache, visual changes, nausea, vomiting, diarrhea, constipation, dizziness, abdominal pain, skin rash, fevers, chills, night  sweats, weight loss, swollen lymph nodes, body aches, joint swelling, muscle aches, chest pain, shortness of breath, mood changes.   Objective  Blood pressure 122/84, pulse 76, weight 203 lb (92.1 kg), SpO2 98 %.  General: No apparent distress alert and oriented x3 mood and affect normal, dressed appropriately.  HEENT: Pupils equal, extraocular movements intact  Respiratory: Patient's speak in full sentences and does not appear short of breath  Cardiovascular: No lower extremity edema, non tender, no erythema  Skin: Warm dry intact with no signs of infection or rash on extremities or on axial skeleton.  Abdomen: Soft nontender  Neuro: Cranial nerves II through XII are intact, neurovascularly intact in all extremities with 2+ DTRs and 2+ pulses.  Lymph: No lymphadenopathy of posterior or anterior cervical chain or axillae bilaterally.  Gait normal with good balance and coordination.  MSK:  Non tender with full range of motion and good stability and symmetric strength and tone of shoulders, elbows, wrist, hip, and ankles bilaterally.  Knee: Bilateral Mild lateral tilt of the patella bilaterally I'll tenderness over the patellofemoral joint ROM full in flexion and extension and lower leg rotation.  Mild crepitus noted Mild instability with valgus force bilaterally painful patellar compression. Patellar glide with moderate to severe crepitus. Patellar and quadriceps tendons unremarkable. Hamstring and quadriceps strength is normal.  Significant improvement    Impression and Recommendations:     This case required medical decision making of  moderate complexity.      Note: This dictation was prepared with Dragon dictation along with smaller phrase technology. Any transcriptional errors that result from this process are unintentional.

## 2015-08-29 NOTE — Assessment & Plan Note (Signed)
Patient doing very well after the injections. We will continue with conservative therapy. We discussed icing regimen. Discussed objective is to do an which was to avoid. Patient follow-up with me again as needed.

## 2015-08-29 NOTE — Patient Instructions (Signed)
Good to see you  Ice is your friend :) Have a great trip If in pain take prednisone daily for 5 days.  See me again when you need me if knees are doing great!

## 2015-08-29 NOTE — Progress Notes (Signed)
Pre visit review using our clinic review tool, if applicable. No additional management support is needed unless otherwise documented below in the visit note. 

## 2015-08-31 ENCOUNTER — Encounter: Payer: Self-pay | Admitting: Gastroenterology

## 2015-08-31 ENCOUNTER — Ambulatory Visit (AMBULATORY_SURGERY_CENTER): Payer: Self-pay

## 2015-08-31 VITALS — Ht 66.0 in | Wt 202.6 lb

## 2015-08-31 DIAGNOSIS — Z1211 Encounter for screening for malignant neoplasm of colon: Secondary | ICD-10-CM

## 2015-08-31 MED ORDER — SUPREP BOWEL PREP KIT 17.5-3.13-1.6 GM/177ML PO SOLN: 1.0000 | Freq: Once | ORAL | 0 refills | Status: AC

## 2015-09-07 ENCOUNTER — Ambulatory Visit: Payer: PPO | Admitting: Internal Medicine

## 2015-09-09 ENCOUNTER — Encounter: Payer: Self-pay | Admitting: Gastroenterology

## 2015-09-09 ENCOUNTER — Ambulatory Visit (AMBULATORY_SURGERY_CENTER): Payer: PPO | Admitting: Gastroenterology

## 2015-09-09 VITALS — BP 102/59 | HR 67 | Temp 98.7°F | Resp 10 | Ht 66.0 in | Wt 202.0 lb

## 2015-09-09 DIAGNOSIS — Z1211 Encounter for screening for malignant neoplasm of colon: Secondary | ICD-10-CM | POA: Diagnosis not present

## 2015-09-09 DIAGNOSIS — E119 Type 2 diabetes mellitus without complications: Secondary | ICD-10-CM | POA: Diagnosis not present

## 2015-09-09 DIAGNOSIS — Z6832 Body mass index (BMI) 32.0-32.9, adult: Secondary | ICD-10-CM | POA: Diagnosis not present

## 2015-09-09 DIAGNOSIS — D123 Benign neoplasm of transverse colon: Secondary | ICD-10-CM

## 2015-09-09 DIAGNOSIS — I10 Essential (primary) hypertension: Secondary | ICD-10-CM | POA: Diagnosis not present

## 2015-09-09 MED ORDER — SODIUM CHLORIDE 0.9 % IV SOLN: 500.0000 mL | INTRAVENOUS | Status: DC

## 2015-09-09 NOTE — Op Note (Signed)
[facility] Patient Name: [name] [name] Procedure Date: 09/09/2015 11:14 AM MRN: [id number] Endoscopist: Ladene Artist , MD Age: 70 Referring MD:  Date of Birth: 09-25-1945 Gender: Female Account #: [id number] Procedure:                Colonoscopy Indications:              Screening for colorectal malignant neoplasm Medicines:                Monitored Anesthesia Care Procedure:                Pre-Anesthesia Assessment:                           - Prior to the procedure, a History and Physical                            was performed, and patient medications and                            allergies were reviewed. The patient's tolerance of                            previous anesthesia was also reviewed. The risks                            and benefits of the procedure and the sedation                            options and risks were discussed with the patient.                            All questions were answered, and informed consent                            was obtained. Prior Anticoagulants: The patient has                            taken no previous anticoagulant or antiplatelet                            agents. ASA Grade Assessment: II - A patient with                            mild systemic disease. After reviewing the risks                            and benefits, the patient was deemed in                            satisfactory condition to undergo the procedure.                           After obtaining informed consent, the colonoscope  was passed under direct vision. Throughout the                            procedure, the patient's blood pressure, pulse, and                            oxygen saturations were monitored continuously. The                            Model PCF-H190L [id number]) scope was introduced                            through the anus and advanced to the the cecum,                            identified by  appendiceal orifice and ileocecal                            valve. The ileocecal valve, appendiceal orifice,                            and rectum were photographed. The quality of the                            bowel preparation was good. The colonoscopy was                            performed without difficulty. The patient tolerated                            the procedure well. Scope In: 11:20:25 AM Scope Out: 11:34:29 AM Scope Withdrawal Time: 0 hours 11 minutes 55 seconds  Total Procedure Duration: 0 hours 14 minutes 4 seconds  Findings:                 A 5 mm polyp was found in the transverse colon. The                            polyp was sessile. The polyp was removed with a                            cold biopsy forceps. Resection and retrieval were                            complete.                           The exam was otherwise without abnormality on                            direct and retroflexion views.                           Many small-mouthed diverticula were found in the  sigmoid colon and descending colon. There was                            narrowing of the colon in association with the                            diverticular opening. There was evidence of                            diverticular spasm. There was no evidence of                            diverticular bleeding. Complications:            No immediate complications. Estimated blood loss:                            None. Estimated Blood Loss:     Estimated blood loss: none. Impression:               - One 5 mm polyp in the transverse colon, removed                            with a cold biopsy forceps. Resected and retrieved.                           - The examination was otherwise normal on direct                            and retroflexion views.                           - Moderate diverticulosis in the sigmoid colon and                            in the descending  colon. Recommendation:           - Repeat colonoscopy in 5 years for surveillance if                            polyps is precancerous, otherwise no plans for                            future screening colonoscopy due to age.                           - Patient has a contact number available for                            emergencies. The signs and symptoms of potential                            delayed complications were discussed with the  patient. Return to normal activities tomorrow.                            Written discharge instructions were provided to the                            patient.                           - Continue present medications.                           - Await pathology results.                           - High fiber diet indefinitely. Ladene Artist, MD 09/09/2015 11:38:35 AM This report has been signed electronically.

## 2015-09-09 NOTE — Progress Notes (Signed)
Called to room to assist during endoscopic procedure.  Patient ID and intended procedure confirmed with present staff. Received instructions for my participation in the procedure from the performing physician.  

## 2015-09-09 NOTE — Progress Notes (Signed)
  [facility] Post-op Note  Patient: [name] [name]  Procedure(s) Performed: colonoscopy  Patient Location: [facility] - Recovery Area  Anesthesia Type: Deep Sedation/Propofol  Level of Consciousness: awake, oriented and patient cooperative  Airway and Oxygen Therapy: Patient Spontanous Breathing  Post-op Pain: none  Post-op Assessment:  Post-op Vital signs reviewed, Patient's Cardiovascular Status Stable, Respiratory Function Stable, Patent Airway, No signs of Nausea or vomiting and Pain level controlled  Post-op Vital Signs: Reviewed and stable  Complications: No apparent anesthesia complications  Traeh Milroy E 11:40 AM

## 2015-09-09 NOTE — Patient Instructions (Signed)
Handouts given:Polyps, [employer].  YOU HAD AN ENDOSCOPIC PROCEDURE TODAY AT [facility]:   Refer to the procedure report that was given to you for any specific questions about what was found during the examination.  If the procedure report does not answer your questions, please call your gastroenterologist to clarify.  If you requested that your care partner not be given the details of your procedure findings, then the procedure report has been included in a sealed envelope for you to review at your convenience later.  YOU SHOULD EXPECT: Some feelings of bloating in the abdomen. Passage of more gas than usual.  Walking can help get rid of the air that was put into your GI tract during the procedure and reduce the bloating. If you had a lower endoscopy (such as a colonoscopy or flexible sigmoidoscopy) you may notice spotting of blood in your stool or on the toilet paper. If you underwent a bowel prep for your procedure, you may not have a normal bowel movement for a few days.  Please Note:  You might notice some irritation and congestion in your nose or some drainage.  This is from the oxygen used during your procedure.  There is no need for concern and it should clear up in a day or so.  SYMPTOMS TO REPORT IMMEDIATELY:   Following lower endoscopy (colonoscopy or flexible sigmoidoscopy):  Excessive amounts of blood in the stool  Significant tenderness or worsening of abdominal pains  Swelling of the abdomen that is new, acute  Fever of 100F or higher  For urgent or emergent issues, a gastroenterologist can be reached at any hour by calling [phone].   DIET: Your first meal following the procedure should be a small meal and then it is ok to progress to your normal diet. Heavy or fried foods are harder to digest and may make you feel nauseous or bloated.  Likewise, meals heavy in dairy and vegetables can increase bloating.  Drink plenty of fluids but you should avoid  alcoholic beverages for 24 hours.  ACTIVITY:  You should plan to take it easy for the rest of today and you should NOT DRIVE or use heavy machinery until tomorrow (because of the sedation medicines used during the test).    FOLLOW UP: Our staff will call the number listed on your records the next business day following your procedure to check on you and address any questions or concerns that you may have regarding the information given to you following your procedure. If we do not reach you, we will leave a message.  However, if you are feeling well and you are not experiencing any problems, there is no need to return our call.  We will assume that you have returned to your regular daily activities without incident.  If any biopsies were taken you will be contacted by phone or by letter within the next 1-3 weeks.  Please call us at [phone] if you have not heard about the biopsies in 3 weeks.    SIGNATURES/CONFIDENTIALITY: You and/or your care partner have signed paperwork which will be entered into your electronic medical record.  These signatures attest to the fact that that the information above on your After Visit Summary has been reviewed and is understood.  Full responsibility of the confidentiality of this discharge information lies with you and/or your care-partner.

## 2015-09-12 ENCOUNTER — Telehealth: Payer: Self-pay | Admitting: *Deleted

## 2015-09-12 NOTE — Telephone Encounter (Signed)
Message left

## 2015-09-22 DIAGNOSIS — Z85828 Personal history of other malignant neoplasm of skin: Secondary | ICD-10-CM | POA: Diagnosis not present

## 2015-09-26 ENCOUNTER — Encounter: Payer: Self-pay | Admitting: Gastroenterology

## 2015-10-20 DIAGNOSIS — H524 Presbyopia: Secondary | ICD-10-CM | POA: Diagnosis not present

## 2015-11-11 DIAGNOSIS — Z85828 Personal history of other malignant neoplasm of skin: Secondary | ICD-10-CM | POA: Diagnosis not present

## 2015-11-11 DIAGNOSIS — D2230 Melanocytic nevi of unspecified part of face: Secondary | ICD-10-CM | POA: Diagnosis not present

## 2015-12-17 DIAGNOSIS — N390 Urinary tract infection, site not specified: Secondary | ICD-10-CM | POA: Diagnosis not present

## 2016-01-12 NOTE — Progress Notes (Signed)
[name] [address] [place], [place] [address] Phone: [phone] Subjective:    I'm seeing this patient by the request  of:  Cathlean Cower, MD   CC: Bilateral knee pain f/u  [id number]  [name] [name] is a 70 y.o. female coming in with complaint of bilateral knee pain. 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 Patient's x-rays were independently visualized by me.  Patient was last seen in [place] ago. Patient states pain has starting to hurt again.  Affecting daily activities. Mild increase in instability. Mild increase in swelling of the knees as well.        Past Medical History:  Diagnosis Date  . DIVERTICULOSIS, COLON 09/10/2007  . DM 03/26/2008  . Glaucoma 03/17/2011  . HYPERLIPIDEMIA 09/10/2007  . HYPERTENSION 09/10/2007  . Hypertension   . Osteoarthritis 10/21/2011  . Shingles 03/17/2011   Past Surgical History:  Procedure Laterality Date  . ABDOMINAL HYSTERECTOMY    . HEMATOMA EVACUATION     2003; abdominal GI bleed 2nd-ary to Aspirin consumption  . TUBAL LIGATION     Social History   Social History  . Marital status: Married    Spouse name: N/A  . Number of children: N/A  . Years of education: N/A   Social History Main Topics  . Smoking status: Never Smoker  . Smokeless tobacco: Never Used  . Alcohol use No  . Drug use: No  . Sexual activity: Not Asked   Other Topics Concern  . None   Social History Narrative  . None   Allergies  Allergen Reactions  . Ivp Dye [Iodinated Diagnostic Agents]   . Sulfamethoxazole     REACTION: unspecified   Family History  Problem Relation Age of Onset  . Hypertension Mother   . Hypertension Father   . Colon cancer Neg Hx     Past medical history, social, surgical and family history all reviewed in electronic medical record.  No pertanent information unless stated regarding to the chief complaint.   Review of Systems: No headache,  visual changes, nausea, vomiting, diarrhea, constipation, dizziness, abdominal pain, skin rash, fevers, chills, night sweats, weight loss, swollen lymph nodes, body aches, joint swelling, muscle aches, chest pain, shortness of breath, mood changes.   Objective  Blood pressure 130/84, pulse 86, weight 206 lb (93.4 kg), SpO2 96 %.  Systems examined below as of 01/13/16 General: NAD A&O x3 mood, affect normal  HEENT: Pupils equal, extraocular movements intact no nystagmus Respiratory: not short of breath at rest or with speaking Cardiovascular: No lower extremity edema, non tender Skin: Warm dry intact with no signs of infection or rash on extremities or on axial skeleton. Abdomen: Soft nontender, no masses Neuro: Cranial nerves  intact, neurovascularly intact in all extremities with 2+ DTRs and 2+ pulses. Lymph: No lymphadenopathy appreciated today  Gait normal with good balance and coordination.  MSK: Non tender with full range of motion and good stability and symmetric strength and tone of shoulders, elbows, wrist,  hips and ankles bilaterally.   Knee: Bilateral Continued lateral tilt of the knee Bilaterally Increased tenderness over the medial aspect of the knees bilaterally worsening pain ROM full in flexion and extension and lower leg rotation.  Moderate crepitus noted Mild instability with valgus force bilaterally painful patellar compression. Patellar glide with moderate to severe crepitus. Patellar and quadriceps tendons unremarkable. Hamstring and quadriceps strength is normal.  Increasing pain.  After  informed written and verbal consent, patient was seated on exam table. Right knee was prepped with alcohol swab and utilizing anterolateral approach, patient's right knee space was injected with 4:1  marcaine 0.5%: Kenalog 40mg /dL. Patient tolerated the procedure well without immediate complications.  After informed written and verbal consent, patient was seated on exam table. Left  knee was prepped with alcohol swab and utilizing anterolateral approach, patient's left knee space was injected with 4:1  marcaine 0.5%: Kenalog 40mg /dL. Patient tolerated the procedure well without immediate complications.    Impression and Recommendations:     This case required medical decision making of moderate complexity.      Note: This dictation was prepared with Dragon dictation along with smaller phrase technology. Any transcriptional errors that result from this process are unintentional.

## 2016-01-13 ENCOUNTER — Ambulatory Visit (INDEPENDENT_AMBULATORY_CARE_PROVIDER_SITE_OTHER): Payer: PPO | Admitting: Family Medicine

## 2016-01-13 ENCOUNTER — Encounter: Payer: Self-pay | Admitting: Family Medicine

## 2016-01-13 DIAGNOSIS — M170 Bilateral primary osteoarthritis of knee: Secondary | ICD-10-CM | POA: Diagnosis not present

## 2016-01-13 MED ORDER — PREDNISONE 50 MG PO TABS: 50.0000 mg | ORAL_TABLET | Freq: Every day | ORAL | 0 refills | Status: DC

## 2016-01-13 NOTE — Assessment & Plan Note (Signed)
Patient was given injections today. Patient tolerated the procedure well. Significant prednisone for any exacerbation while she was out of town. We discussed topical anti-inflammatory's. Patient will return again in 4-6 weeks and at that time if worsening symptoms she could be a candidate for viscous supple mentation.

## 2016-01-13 NOTE — Patient Instructions (Signed)
Have a great time Happy holidays!  Prednisone only if you need it.  pennsaid pinkie amount topically 2 times daily as needed.  See me again when you need me.

## 2016-01-17 ENCOUNTER — Ambulatory Visit: Payer: PPO | Admitting: Internal Medicine

## 2016-02-09 DIAGNOSIS — L739 Follicular disorder, unspecified: Secondary | ICD-10-CM | POA: Diagnosis not present

## 2016-02-09 DIAGNOSIS — L814 Other melanin hyperpigmentation: Secondary | ICD-10-CM | POA: Diagnosis not present

## 2016-02-09 DIAGNOSIS — D1801 Hemangioma of skin and subcutaneous tissue: Secondary | ICD-10-CM | POA: Diagnosis not present

## 2016-02-09 DIAGNOSIS — L821 Other seborrheic keratosis: Secondary | ICD-10-CM | POA: Diagnosis not present

## 2016-02-09 DIAGNOSIS — Z85828 Personal history of other malignant neoplasm of skin: Secondary | ICD-10-CM | POA: Diagnosis not present

## 2016-02-13 ENCOUNTER — Telehealth: Payer: Self-pay

## 2016-02-13 ENCOUNTER — Other Ambulatory Visit (INDEPENDENT_AMBULATORY_CARE_PROVIDER_SITE_OTHER): Payer: PPO

## 2016-02-13 DIAGNOSIS — E119 Type 2 diabetes mellitus without complications: Secondary | ICD-10-CM

## 2016-02-13 DIAGNOSIS — Z1159 Encounter for screening for other viral diseases: Secondary | ICD-10-CM | POA: Diagnosis not present

## 2016-02-13 LAB — LIPID PANEL
CHOL/HDL RATIO: 3
Cholesterol: 138 mg/dL (ref 0–200)
HDL: 45.3 mg/dL (ref >39.00)
LDL CALC: 63 mg/dL (ref 0–99)
NonHDL: 92.74
TRIGLYCERIDES: 150 mg/dL — AB (ref 0.0–149.0)
VLDL: 30 mg/dL (ref 0.0–40.0)

## 2016-02-13 LAB — HEPATIC FUNCTION PANEL
ALBUMIN: 4.2 g/dL (ref 3.5–5.2)
ALK PHOS: 63 U/L (ref 39–117)
ALT: 25 U/L (ref 0–35)
AST: 24 U/L (ref 0–37)
BILIRUBIN DIRECT: 0.2 mg/dL (ref 0.0–0.3)
TOTAL PROTEIN: 7.1 g/dL (ref 6.0–8.3)
Total Bilirubin: 1.1 mg/dL (ref 0.2–1.2)

## 2016-02-13 LAB — BASIC METABOLIC PANEL
BUN: 22 mg/dL (ref 6–23)
CALCIUM: 9.4 mg/dL (ref 8.4–10.5)
CHLORIDE: 104 meq/L (ref 96–112)
CO2: 22 meq/L (ref 19–32)
CREATININE: 0.77 mg/dL (ref 0.40–1.20)
GFR: 78.27 mL/min (ref >60.00)
Glucose, Bld: 178 mg/dL — ABNORMAL HIGH (ref 70–99)
Potassium: 4.5 mEq/L (ref 3.5–5.1)
Sodium: 137 mEq/L (ref 135–145)

## 2016-02-13 LAB — HEMOGLOBIN A1C: HEMOGLOBIN A1C: 7.7 % — AB (ref 4.6–6.5)

## 2016-02-13 LAB — HEPATITIS C ANTIBODY: HCV AB: NEGATIVE

## 2016-02-13 NOTE — Telephone Encounter (Signed)
Per patient request, wanted labs from may/2017 re-entered because she never came in may to get them---patient advised of possible out of pocket costs with labs not associated on same day as office visit

## 2016-02-14 ENCOUNTER — Encounter: Payer: Self-pay | Admitting: Internal Medicine

## 2016-02-14 ENCOUNTER — Ambulatory Visit (INDEPENDENT_AMBULATORY_CARE_PROVIDER_SITE_OTHER): Payer: PPO | Admitting: Internal Medicine

## 2016-02-14 VITALS — BP 136/72 | HR 104 | Temp 98.1°F | Resp 20 | Wt 204.0 lb

## 2016-02-14 DIAGNOSIS — Z0001 Encounter for general adult medical examination with abnormal findings: Secondary | ICD-10-CM

## 2016-02-14 DIAGNOSIS — E119 Type 2 diabetes mellitus without complications: Secondary | ICD-10-CM

## 2016-02-14 NOTE — Patient Instructions (Signed)
Please continue all other medications as before, and refills have been done if requested.  Please have the pharmacy call with any other refills you may need.  Please continue your efforts at being more active, low cholesterol diet, and weight control.  You are otherwise up to date with prevention measures today.  Please keep your appointments with your specialists as you may have planned  Please return in 6 months, or sooner if needed, with Lab testing done 3-5 days before  

## 2016-02-14 NOTE — Progress Notes (Signed)
Subjective:    Patient ID: [name] [name], female    DOB: 1945-08-09, 71 y.o.   MRN: [id number]  HPI Here for wellness and f/u;  Overall doing ok;  Pt denies Chest pain, worsening SOB, DOE, wheezing, orthopnea, PND, worsening LE edema, palpitations, dizziness or syncope.  Pt denies neurological change such as new headache, facial or extremity weakness.  Pt denies polydipsia, polyuria, or low sugar symptoms. Pt states overall good compliance with treatment and medications, good tolerability, and has been trying to follow appropriate diet.  Pt denies worsening depressive symptoms, suicidal ideation or panic. No fever, night sweats, wt loss, loss of appetite, or other constitutional symptoms.  Pt states good ability with ADL's, has low fall risk, home safety reviewed and adequate, no other significant changes in hearing or vision, and only occasionally active with exercise.  Has started wt watchers recently, plans to lose wt soon   Wt Readings from Last 3 Encounters:  02/14/16 204 lb (92.5 kg)  01/13/16 206 lb (93.4 kg)  09/09/15 202 lb (91.6 kg)  Pt continues to have recurring bilat knee pain without change in severity, bowel or bladder change, fever, wt loss,  worsening LE pain/numbness/weakness, gait change or falls, seems to be better with meg red supplements, has seen Dr Smith/sports med with cortisone as well, last shot about dec 10.  Walked and did fine with [place] family vacation over the holidays, admits to worsening diet compliance that mo as well.  No other new history Past Medical History:  Diagnosis Date  . DIVERTICULOSIS, COLON 09/10/2007  . DM 03/26/2008  . Glaucoma 03/17/2011  . HYPERLIPIDEMIA 09/10/2007  . HYPERTENSION 09/10/2007  . Hypertension   . Osteoarthritis 10/21/2011  . Shingles 03/17/2011   Past Surgical History:  Procedure Laterality Date  . ABDOMINAL HYSTERECTOMY    . HEMATOMA EVACUATION     2003; abdominal GI bleed 2nd-ary to Aspirin consumption  . TUBAL LIGATION      reports that she has never smoked. She has never used smokeless tobacco. She reports that she does not drink alcohol or use drugs. family history includes Hypertension in her father and mother. Allergies  Allergen Reactions  . Ivp Dye [Iodinated Diagnostic Agents]   . Sulfamethoxazole     REACTION: unspecified   Current Outpatient Prescriptions on File Prior to Visit  Medication Sig Dispense Refill  . bimatoprost (LUMIGAN) 0.01 % SOLN 1 drop at bedtime.    . Calcium Carbonate (CALCIUM 600 PO) Take 1 capsule by mouth daily.    . Cholecalciferol (VITAMIN D) 2000 units CAPS Take 1 capsule by mouth daily.    Javier Docker Oil (MAXIMUM RED KRILL PO) Take by mouth daily.    [name] losartan (COZAAR) 100 MG tablet TAKE 1 TABLET (100 MG TOTAL) BY MOUTH DAILY. 90 tablet 3  . metFORMIN (GLUCOPHAGE) 500 MG tablet TAKE 1 TABLET (500 MG TOTAL) BY MOUTH 2 (TWO) TIMES DAILY WITH A MEAL. 180 tablet 3  . Misc Natural Products (TART CHERRY ADVANCED PO) Take 1 capsule by mouth daily.    . Multiple Vitamin (MULTIVITAMIN) tablet Take 1 tablet by mouth daily.    . predniSONE (DELTASONE) 50 MG tablet Take 1 tablet (50 mg total) by mouth daily. 5 tablet 0  . simvastatin (ZOCOR) 40 MG tablet TAKE 1 TABLET (40 MG TOTAL) BY MOUTH DAILY. 90 tablet 3   Current Facility-Administered Medications on File Prior to Visit  Medication Dose Route Frequency Provider Last Rate Last Dose  . 0.9 %  sodium chloride infusion  500 mL Intravenous Continuous Ladene Artist, MD       Review of Systems Constitutional: Negative for increased diaphoresis, or other activity, appetite or siginficant weight change other than noted HENT: Negative for worsening hearing loss, ear pain, facial swelling, mouth sores and neck stiffness.   Eyes: Negative for other worsening pain, redness or visual disturbance.  Respiratory: Negative for choking or stridor Cardiovascular: Negative for other chest pain and palpitations.  Gastrointestinal: Negative for  worsening diarrhea, blood in stool, or abdominal distention Genitourinary: Negative for hematuria, flank pain or change in urine volume.  Musculoskeletal: Negative for myalgias or other joint complaints.  Skin: Negative for other color change and wound or drainage.  Neurological: Negative for syncope and numbness. other than noted Hematological: Negative for adenopathy. or other swelling Psychiatric/Behavioral: Negative for hallucinations, SI, self-injury, decreased concentration or other worsening agitation.  All other system neg per pt    Objective:   Physical Exam        Assessment & Plan:

## 2016-02-14 NOTE — Progress Notes (Signed)
Pre visit review using our clinic review tool, if applicable. No additional management support is needed unless otherwise documented below in the visit note. 

## 2016-02-18 NOTE — Assessment & Plan Note (Signed)

## 2016-02-18 NOTE — Assessment & Plan Note (Addendum)
stable overall by history and exam, recent data reviewed with pt, and pt to continue medical treatment as before,  to f/u any worsening symptoms or concerns Lab Results  Component Value Date   HGBA1C 7.7 (H) 02/13/2016   Goal < 7, pt delines change in med at this time, prefers diet and wt loss efforts

## 2016-03-07 NOTE — Progress Notes (Signed)
[name] [address] [place], [name] [address] Phone: [phone] Subjective:    I'm seeing this patient by the request  of:  Cathlean Cower, MD   CC: Bilateral knee pain f/u  [id number]  [name] [name] is a 71 y.o. female coming in with complaint of bilateral knee pain. 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 Patient's x-rays were independently visualized by me.  Patient was last seen 8 weeks ago. Patient was given an injection in her knees. Patient was to continue with conservative therapy. Patient states She did very good for about a month. Started having worsening symptoms again. Patient states that it is as severe as it was previously or possibly worse. Affecting daily activities. Sometimes has instability.       Past Medical History:  Diagnosis Date  . DIVERTICULOSIS, COLON 09/10/2007  . DM 03/26/2008  . Glaucoma 03/17/2011  . HYPERLIPIDEMIA 09/10/2007  . HYPERTENSION 09/10/2007  . Hypertension   . Osteoarthritis 10/21/2011  . Shingles 03/17/2011   Past Surgical History:  Procedure Laterality Date  . ABDOMINAL HYSTERECTOMY    . HEMATOMA EVACUATION     2003; abdominal GI bleed 2nd-ary to Aspirin consumption  . TUBAL LIGATION     Social History   Social History  . Marital status: Married    Spouse name: N/A  . Number of children: N/A  . Years of education: N/A   Social History Main Topics  . Smoking status: Never Smoker  . Smokeless tobacco: Never Used  . Alcohol use No  . Drug use: No  . Sexual activity: Not Asked   Other Topics Concern  . None   Social History Narrative  . None   Allergies  Allergen Reactions  . Ivp Dye [Iodinated Diagnostic Agents]   . Sulfamethoxazole     REACTION: unspecified   Family History  Problem Relation Age of Onset  . Hypertension Mother   . Hypertension Father   . Colon cancer Neg Hx     Past medical history, social, surgical and family history all  reviewed in electronic medical record.  No pertanent information unless stated regarding to the chief complaint.   Review of Systems: No headache, visual changes, nausea, vomiting, diarrhea, constipation, dizziness, abdominal pain, skin rash, fevers, chills, night sweats, weight loss, swollen lymph nodes, body aches,muscle aches, chest pain, shortness of breath, mood changes.  Positive for joint swelling  Objective  Blood pressure 132/86, pulse 89, height 5\' 6"  (1.676 m), weight 207 lb (93.9 kg), SpO2 98 %.  Systems examined below as of 03/08/16 General: NAD A&O x3 mood, affect normal  HEENT: Pupils equal, extraocular movements intact no nystagmus Respiratory: not short of breath at rest or with speaking Cardiovascular: No lower extremity edema, non tender Skin: Warm dry intact with no signs of infection or rash on extremities or on axial skeleton. Abdomen: Soft nontender, no masses Neuro: Cranial nerves  intact, neurovascularly intact in all extremities with 2+ DTRs and 2+ pulses. Lymph: No lymphadenopathy appreciated today  Gait normal with good balance and coordination.  MSK: Non tender with full range of motion and good stability and symmetric strength and tone of shoulders, elbows, wrist, hips and ankles bilaterally.   Knee: Bilateral valgus deformity noted. Large thigh to calf ratio.  Tender to palpation over medial and PF joint line.  ROM full in flexion and extension and lower leg rotation. instability with valgus force.  painful patellar compression. Patellar glide  with moderate crepitus. Patellar and quadriceps tendons unremarkable. Hamstring and quadriceps strength is normal. Contralateral knee shows    After informed written and verbal consent, patient was seated on exam table. Left knee was prepped with alcohol swab and utilizing anterolateral approach, patient's left knee space was injected with15 mg/2.5 mL of Orthovisc(sodium hyaluronate) in a prefilled syringe was  injected easily into the knee through a 22-gauge needle..Patient tolerated the procedure well without immediate complications.  After informed written and verbal consent, patient was seated on exam table. Right knee was prepped with alcohol swab and utilizing anterolateral approach, patient's right knee space was injected with15 mg/2.5 mL of Orthovisc(sodium hyaluronate) in a prefilled syringe was injected easily into the knee through a 22-gauge needle..Patient tolerated the procedure well without immediate complications..    Impression and Recommendations:     This case required medical decision making of moderate complexity.      Note: This dictation was prepared with Dragon dictation along with smaller phrase technology. Any transcriptional errors that result from this process are unintentional.

## 2016-03-08 ENCOUNTER — Encounter: Payer: Self-pay | Admitting: Family Medicine

## 2016-03-08 ENCOUNTER — Ambulatory Visit (INDEPENDENT_AMBULATORY_CARE_PROVIDER_SITE_OTHER): Payer: PPO | Admitting: Family Medicine

## 2016-03-08 VITALS — BP 132/86 | HR 89 | Ht 66.0 in | Wt 207.0 lb

## 2016-03-08 DIAGNOSIS — M170 Bilateral primary osteoarthritis of knee: Secondary | ICD-10-CM

## 2016-03-08 DIAGNOSIS — M25552 Pain in left hip: Secondary | ICD-10-CM

## 2016-03-08 NOTE — Patient Instructions (Signed)
Good to see you  Started Orthovisc in each knee  Xray of hip when yo can.  I will see you again next week!

## 2016-03-08 NOTE — Assessment & Plan Note (Signed)
Gout conservative therapy. At this time patient is a candidate for viscous supplementation and started first injection in series of 4 injections. Patient was having worsening symptoms. We discussed icing regimen and home exercises. Patient will come back and see me again in 1 week for second in a series of 4 injections.

## 2016-03-15 ENCOUNTER — Ambulatory Visit (INDEPENDENT_AMBULATORY_CARE_PROVIDER_SITE_OTHER): Payer: PPO | Admitting: Family Medicine

## 2016-03-15 ENCOUNTER — Encounter: Payer: Self-pay | Admitting: Family Medicine

## 2016-03-15 DIAGNOSIS — M170 Bilateral primary osteoarthritis of knee: Secondary | ICD-10-CM

## 2016-03-15 NOTE — Patient Instructions (Signed)
Good to see you  2 down and 2 to go  Ice is your friend.  See me again next week!

## 2016-03-15 NOTE — Assessment & Plan Note (Signed)
2 of 4 injections today Icing and HEP RTC in 1 week.

## 2016-03-15 NOTE — Progress Notes (Signed)
[name] [place] [address] [place], [place] [address] Phone: [phone] Subjective:    I'm seeing this patient by the request  of:  Cathlean Cower, MD   CC: Bilateral knee pain f/u  [id number]  [name] [name] is a 71 y.o. female coming in with complaint of bilateral knee pain. 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 Patient's x-rays were independently visualized by me.  Has failed all conservative therapy. Patient is here for second in a series of 4 injections for viscous supplementation. No significant change with the first ones.       Past Medical History:  Diagnosis Date  . DIVERTICULOSIS, COLON 09/10/2007  . DM 03/26/2008  . Glaucoma 03/17/2011  . HYPERLIPIDEMIA 09/10/2007  . HYPERTENSION 09/10/2007  . Hypertension   . Osteoarthritis 10/21/2011  . Shingles 03/17/2011   Past Surgical History:  Procedure Laterality Date  . ABDOMINAL HYSTERECTOMY    . HEMATOMA EVACUATION     2003; abdominal GI bleed 2nd-ary to Aspirin consumption  . TUBAL LIGATION     Social History   Social History  . Marital status: Married    Spouse name: N/A  . Number of children: N/A  . Years of education: N/A   Social History Main Topics  . Smoking status: Never Smoker  . Smokeless tobacco: Never Used  . Alcohol use No  . Drug use: No  . Sexual activity: Not Asked   Other Topics Concern  . None   Social History Narrative  . None   Allergies  Allergen Reactions  . Ivp Dye [Iodinated Diagnostic Agents]   . Sulfamethoxazole     REACTION: unspecified   Family History  Problem Relation Age of Onset  . Hypertension Mother   . Hypertension Father   . Colon cancer Neg Hx     Past medical history, social, surgical and family history all reviewed in electronic medical record.  No pertanent information unless stated regarding to the chief complaint.   Review of Systems: No headache, visual changes, nausea, vomiting, diarrhea,  constipation, dizziness, abdominal pain, skin rash, fevers, chills, night sweats, weight loss, swollen lymph nodes, body aches,muscle aches, chest pain, shortness of breath, mood changes.  Positive for joint swelling  Objective  Blood pressure (!) 148/92, pulse 82, height 5\' 6"  (1.676 m), weight 207 lb (93.9 kg), SpO2 98 %.  Systems examined below as of 03/15/16 General: NAD A&O x3 mood, affect normal  HEENT: Pupils equal, extraocular movements intact no nystagmus Respiratory: not short of breath at rest or with speaking Cardiovascular: No lower extremity edema, non tender Skin: Warm dry intact with no signs of infection or rash on extremities or on axial skeleton. Abdomen: Soft nontender, no masses Neuro: Cranial nerves  intact, neurovascularly intact in all extremities with 2+ DTRs and 2+ pulses. Lymph: No lymphadenopathy appreciated today  Gait normal with good balance and coordination.  MSK: Non tender with full range of motion and good stability and symmetric strength and tone of shoulders, elbows, wrist, hips and ankles bilaterally.   Knee:Bilateral  valgus deformity noted. Large thigh to calf ratio.  Tender to palpation over medial and PF joint line.  ROM full in flexion and extension and lower leg rotation. instability with valgus force.  painful patellar compression. Patellar glide with moderate crepitus. Patellar and quadriceps tendons unremarkable. Hamstring and quadriceps strength is normal. Contralateral knee shows   After informed written and verbal consent, patient was seated on exam  table. Right knee was prepped with alcohol swab and utilizing anterolateral approach, patient's right knee space was injected with15 mg/2.5 mL of Orthovisc(sodium hyaluronate) in a prefilled syringe was injected easily into the knee through a 22-gauge needle..Patient tolerated the procedure well without immediate complications.  After informed written and verbal consent, patient was seated on  exam table. Left knee was prepped with alcohol swab and utilizing anterolateral approach, patient's left knee space was injected with15 mg/2.5 mL of Orthovisc(sodium hyaluronate) in a prefilled syringe was injected easily into the knee through a 22-gauge needle..Patient tolerated the procedure well without immediate complications.    Impression and Recommendations:     This case required medical decision making of moderate complexity.      Note: This dictation was prepared with Dragon dictation along with smaller phrase technology. Any transcriptional errors that result from this process are unintentional.

## 2016-03-21 ENCOUNTER — Ambulatory Visit (INDEPENDENT_AMBULATORY_CARE_PROVIDER_SITE_OTHER)
Admission: RE | Admit: 2016-03-21 | Discharge: 2016-03-21 | Disposition: A | Discharge status: Home / Self Care | Payer: PPO | Source: Ambulatory Visit | Attending: Family Medicine | Admitting: Family Medicine

## 2016-03-21 DIAGNOSIS — M25552 Pain in left hip: Secondary | ICD-10-CM | POA: Diagnosis not present

## 2016-03-21 DIAGNOSIS — M1612 Unilateral primary osteoarthritis, left hip: Secondary | ICD-10-CM | POA: Diagnosis not present

## 2016-03-21 NOTE — Progress Notes (Signed)
[name] [place], [place] [address] Phone: [phone] Subjective:    I'm seeing this patient by the request  of:  Cathlean Cower, MD   CC: Bilateral knee pain f/u  [id number]  [name] [name] is a 71 y.o. female coming in with complaint of bilateral knee pain. 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 Patient's x-rays were independently visualized by me.  Has failed all conservative therapy. Patient is here for 3rd in a series of 4 injections for viscous supplementation. Patient states that she has had some mild improvement since previous exam.       Past Medical History:  Diagnosis Date  . DIVERTICULOSIS, COLON 09/10/2007  . DM 03/26/2008  . Glaucoma 03/17/2011  . HYPERLIPIDEMIA 09/10/2007  . HYPERTENSION 09/10/2007  . Hypertension   . Osteoarthritis 10/21/2011  . Shingles 03/17/2011   Past Surgical History:  Procedure Laterality Date  . ABDOMINAL HYSTERECTOMY    . HEMATOMA EVACUATION     2003; abdominal GI bleed 2nd-ary to Aspirin consumption  . TUBAL LIGATION     Social History   Social History  . Marital status: Married    Spouse name: N/A  . Number of children: N/A  . Years of education: N/A   Social History Main Topics  . Smoking status: Never Smoker  . Smokeless tobacco: Never Used  . Alcohol use No  . Drug use: No  . Sexual activity: Not on file   Other Topics Concern  . Not on file   Social History Narrative  . No narrative on file   Allergies  Allergen Reactions  . Ivp Dye [Iodinated Diagnostic Agents]   . Sulfamethoxazole     REACTION: unspecified   Family History  Problem Relation Age of Onset  . Hypertension Mother   . Hypertension Father   . Colon cancer Neg Hx     Past medical history, social, surgical and family history all reviewed in electronic medical record.  No pertanent information unless stated regarding to the chief complaint.   Review of Systems: No  headache, visual changes, nausea, vomiting, diarrhea, constipation, dizziness, abdominal pain, skin rash, fevers, chills, night sweats, weight loss, swollen lymph nodes, body aches,muscle aches, chest pain, shortness of breath, mood changes.  Positive for joint swelling  Objective  There were no vitals taken for this visit.  Systems examined below as of 03/21/16 General: NAD A&O x3 mood, affect normal  HEENT: Pupils equal, extraocular movements intact no nystagmus Respiratory: not short of breath at rest or with speaking Cardiovascular: No lower extremity edema, non tender Skin: Warm dry intact with no signs of infection or rash on extremities or on axial skeleton. Abdomen: Soft nontender, no masses Neuro: Cranial nerves  intact, neurovascularly intact in all extremities with 2+ DTRs and 2+ pulses. Lymph: No lymphadenopathy appreciated today  Gait normal with good balance and coordination.  MSK: Non tender with full range of motion and good stability and symmetric strength and tone of shoulders, elbows, wrist, hips and ankles bilaterally.   Knee:Bilateral  valgus deformity noted. Large thigh to calf ratio.  Tender to palpation over medial and PF joint line.  ROM full in flexion and extension and lower leg rotation. instability with valgus force.  painful patellar compression. Patellar glide with moderate crepitus. Patellar and quadriceps tendons unremarkable. Hamstring and quadriceps strength is normal. Contralateral knee shows   After informed written and verbal consent, patient was seated on exam  table. Left knee was prepped with alcohol swab and utilizing anterolateral approach, patient's left knee space was injected with15 mg/2.5 mL of Orthovisc(sodium hyaluronate) in a prefilled syringe was injected easily into the knee through a 22-gauge needle..Patient tolerated the procedure well without immediate complications.  After informed written and verbal consent, patient was seated on  exam table. Right knee was prepped with alcohol swab and utilizing anterolateral approach, patient's right knee space was injected with15 mg/2.5 mL of Orthovisc(sodium hyaluronate) in a prefilled syringe was injected easily into the knee through a 22-gauge needle..Patient tolerated the procedure well without immediate complications.    Impression and Recommendations:     This case required medical decision making of moderate complexity.      Note: This dictation was prepared with Dragon dictation along with smaller phrase technology. Any transcriptional errors that result from this process are unintentional.

## 2016-03-22 ENCOUNTER — Encounter: Payer: Self-pay | Admitting: Family Medicine

## 2016-03-22 ENCOUNTER — Ambulatory Visit (INDEPENDENT_AMBULATORY_CARE_PROVIDER_SITE_OTHER): Payer: PPO | Admitting: Family Medicine

## 2016-03-22 DIAGNOSIS — M170 Bilateral primary osteoarthritis of knee: Secondary | ICD-10-CM | POA: Diagnosis not present

## 2016-03-22 NOTE — Assessment & Plan Note (Signed)
Bilateral injections given today. 3 out of 4 in the series is done. Follow-up in one week for fourth. Continue conservative therapy otherwise.

## 2016-03-22 NOTE — Patient Instructions (Signed)
Good to see you  3 down and 1 to go.  Ice is your friend.  You know the drill  See you again for the last one!

## 2016-03-29 ENCOUNTER — Ambulatory Visit (INDEPENDENT_AMBULATORY_CARE_PROVIDER_SITE_OTHER): Payer: PPO | Admitting: Family Medicine

## 2016-03-29 ENCOUNTER — Encounter: Payer: Self-pay | Admitting: Family Medicine

## 2016-03-29 DIAGNOSIS — M170 Bilateral primary osteoarthritis of knee: Secondary | ICD-10-CM | POA: Diagnosis not present

## 2016-03-29 NOTE — Progress Notes (Signed)
[name] [address] [place], [place] [address] Phone: [phone] Subjective:    I'm seeing this patient by the request  of:  Cathlean Cower, MD   CC: Bilateral knee pain f/u  [id number]  [name] [name] is a 71 y.o. female coming in with complaint of bilateral knee pain. 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 Patient's x-rays were independently visualized by me.  Has failed all conservative therapy. Patient is here for 4th  in a series of 4 injections for viscous supplementation.  Continues to make some improvement. Patient states that she is not pain free but is better than when she started these injections.       Past Medical History:  Diagnosis Date  . DIVERTICULOSIS, COLON 09/10/2007  . DM 03/26/2008  . Glaucoma 03/17/2011  . HYPERLIPIDEMIA 09/10/2007  . HYPERTENSION 09/10/2007  . Hypertension   . Osteoarthritis 10/21/2011  . Shingles 03/17/2011   Past Surgical History:  Procedure Laterality Date  . ABDOMINAL HYSTERECTOMY    . HEMATOMA EVACUATION     2003; abdominal GI bleed 2nd-ary to Aspirin consumption  . TUBAL LIGATION     Social History   Social History  . Marital status: Married    Spouse name: N/A  . Number of children: N/A  . Years of education: N/A   Social History Main Topics  . Smoking status: Never Smoker  . Smokeless tobacco: Never Used  . Alcohol use No  . Drug use: No  . Sexual activity: Not Asked   Other Topics Concern  . None   Social History Narrative  . None   Allergies  Allergen Reactions  . Ivp Dye [Iodinated Diagnostic Agents]   . Sulfamethoxazole     REACTION: unspecified   Family History  Problem Relation Age of Onset  . Hypertension Mother   . Hypertension Father   . Colon cancer Neg Hx     Past medical history, social, surgical and family history all reviewed in electronic medical record.  No pertanent information unless stated regarding to the chief  complaint.   Review of Systems: No headache, visual changes, nausea, vomiting, diarrhea, constipation, dizziness, abdominal pain, skin rash, fevers, chills, night sweats, weight loss, swollen lymph nodes, body aches,  muscle aches, chest pain, shortness of breath, mood changes.  Continues to be positive for joint swelling  Objective  Blood pressure 122/80, pulse 70, height 5\' 6"  (1.676 m), SpO2 98 %.  Systems examined below as of 03/29/16 General: NAD A&O x3 mood, affect normal  HEENT: Pupils equal, extraocular movements intact no nystagmus Respiratory: not short of breath at rest or with speaking Cardiovascular: No lower extremity edema, non tender Skin: Warm dry intact with no signs of infection or rash on extremities or on axial skeleton. Abdomen: Soft nontender, no masses Neuro: Cranial nerves  intact, neurovascularly intact in all extremities with 2+ DTRs and 2+ pulses. Lymph: No lymphadenopathy appreciated today  Gait normal with good balance and coordination.  MSK: Non tender with full range of motion and good stability and symmetric strength and tone of shoulders, elbows, wrist, hips and ankles bilaterally.   Knee:Bilateral  valgus deformity noted. Large thigh to calf ratio.  Tender to palpation over medial and PF joint line. Improvement from previous exam ROM full in flexion and extension and lower leg rotation. instability with valgus force.  painful patellar compression. Patellar glide with mild crepitus. Patellar and quadriceps tendons unremarkable. Hamstring and quadriceps  strength is normal.   After informed written and verbal consent, patient was seated on exam table. Left knee was prepped with alcohol swab and utilizing anterolateral approach, patient's left knee space was injected with15 mg/2.5 mL of Orthovisc(sodium hyaluronate) in a prefilled syringe was injected easily into the knee through a 22-gauge needle..Patient tolerated the procedure well without immediate  complications.  After informed written and verbal consent, patient was seated on exam table. Right knee was prepped with alcohol swab and utilizing anterolateral approach, patient's right knee space was injected with15 mg/2.5 mL of Orthovisc(sodium hyaluronate) in a prefilled syringe was injected easily into the knee through a 22-gauge needle..Patient tolerated the procedure well without immediate complications.    Impression and Recommendations:     This case required medical decision making of moderate complexity.      Note: This dictation was prepared with Dragon dictation along with smaller phrase technology. Any transcriptional errors that result from this process are unintentional.

## 2016-03-29 NOTE — Assessment & Plan Note (Signed)
Finished supple mentation today. Tolerated the procedure well. Discussed increasing activity slowly. Discussed icing regimen. Patient She does well follow-up as needed.

## 2016-05-30 DIAGNOSIS — M170 Bilateral primary osteoarthritis of knee: Secondary | ICD-10-CM | POA: Diagnosis not present

## 2016-05-31 ENCOUNTER — Encounter: Payer: Self-pay | Admitting: Internal Medicine

## 2016-05-31 ENCOUNTER — Other Ambulatory Visit: Payer: Self-pay | Admitting: Internal Medicine

## 2016-05-31 ENCOUNTER — Other Ambulatory Visit (INDEPENDENT_AMBULATORY_CARE_PROVIDER_SITE_OTHER): Payer: PPO

## 2016-05-31 ENCOUNTER — Ambulatory Visit (INDEPENDENT_AMBULATORY_CARE_PROVIDER_SITE_OTHER): Payer: PPO | Admitting: Internal Medicine

## 2016-05-31 VITALS — BP 144/90 | HR 83 | Ht 67.0 in | Wt 205.0 lb

## 2016-05-31 DIAGNOSIS — I10 Essential (primary) hypertension: Secondary | ICD-10-CM

## 2016-05-31 DIAGNOSIS — E119 Type 2 diabetes mellitus without complications: Secondary | ICD-10-CM

## 2016-05-31 DIAGNOSIS — D689 Coagulation defect, unspecified: Secondary | ICD-10-CM | POA: Insufficient documentation

## 2016-05-31 DIAGNOSIS — E785 Hyperlipidemia, unspecified: Secondary | ICD-10-CM | POA: Diagnosis not present

## 2016-05-31 DIAGNOSIS — D699 Hemorrhagic condition, unspecified: Secondary | ICD-10-CM | POA: Insufficient documentation

## 2016-05-31 DIAGNOSIS — Z0001 Encounter for general adult medical examination with abnormal findings: Secondary | ICD-10-CM

## 2016-05-31 LAB — HEPATIC FUNCTION PANEL
ALBUMIN: 4.3 g/dL (ref 3.5–5.2)
ALT: 17 U/L (ref 0–35)
AST: 18 U/L (ref 0–37)
Alkaline Phosphatase: 59 U/L (ref 39–117)
Bilirubin, Direct: 0.2 mg/dL (ref 0.0–0.3)
TOTAL PROTEIN: 6.9 g/dL (ref 6.0–8.3)
Total Bilirubin: 1 mg/dL (ref 0.2–1.2)

## 2016-05-31 LAB — BASIC METABOLIC PANEL
BUN: 24 mg/dL — AB (ref 6–23)
CALCIUM: 9.4 mg/dL (ref 8.4–10.5)
CO2: 25 mEq/L (ref 19–32)
Chloride: 103 mEq/L (ref 96–112)
Creatinine, Ser: 0.76 mg/dL (ref 0.40–1.20)
GFR: 79.4 mL/min (ref >60.00)
GLUCOSE: 177 mg/dL — AB (ref 70–99)
POTASSIUM: 4.6 meq/L (ref 3.5–5.1)
Sodium: 137 mEq/L (ref 135–145)

## 2016-05-31 LAB — HEMOGLOBIN A1C: HEMOGLOBIN A1C: 7 % — AB (ref 4.6–6.5)

## 2016-05-31 LAB — LIPID PANEL
CHOLESTEROL: 121 mg/dL (ref 0–200)
HDL: 39.1 mg/dL (ref >39.00)
LDL Cholesterol: 46 mg/dL (ref 0–99)
NONHDL: 81.75
Total CHOL/HDL Ratio: 3
Triglycerides: 180 mg/dL — ABNORMAL HIGH (ref 0.0–149.0)
VLDL: 36 mg/dL (ref 0.0–40.0)

## 2016-05-31 NOTE — Assessment & Plan Note (Signed)
stable overall by history and exam, recent data reviewed with pt, and pt to continue medical treatment as before,  to f/u any worsening symptoms or concerns Lab Results  Component Value Date   HGBA1C 7.0 (H) 05/31/2016

## 2016-05-31 NOTE — Assessment & Plan Note (Signed)
stable overall by history and exam, recent data reviewed with pt, and pt to continue medical treatment as before,  to f/u any worsening symptoms or concerns Lab Results  Component Value Date   LDLCALC 46 05/31/2016

## 2016-05-31 NOTE — Progress Notes (Signed)
Pre visit review using our clinic review tool, if applicable. No additional management support is needed unless otherwise documented below in the visit note. 

## 2016-05-31 NOTE — Patient Instructions (Signed)
Please continue all other medications as before, and refills have been done if requested.  Please have the pharmacy call with any other refills you may need.  Please continue your efforts at being more active, low cholesterol diet, and weight control.  You are otherwise up to date with prevention measures today.  Please keep your appointments with your specialists as you may have planned  You will be contacted regarding the referral for: Hematology/Oncology  Please return in 6 months, or sooner if needed, with Lab testing done 3-5 days before

## 2016-05-31 NOTE — Assessment & Plan Note (Signed)
Mild elvev today but overall stable overall by history and exam, recent data reviewed with pt, and pt to continue medical treatment as before,  to f/u any worsening symptoms or concerns BP Readings from Last 3 Encounters:  05/31/16 (!) 144/90  03/29/16 122/80  03/22/16 136/88

## 2016-05-31 NOTE — Progress Notes (Signed)
Subjective:    Patient ID: REATHA SUR, female    DOB: [birth year], 71 y.o.   MRN: [id number]  HPI  Here to f/u; overall doing ok,  Pt denies chest pain, increasing sob or doe, wheezing, orthopnea, PND, increased LE swelling, palpitations, dizziness or syncope.  Pt denies new neurological symptoms such as new headache, or facial or extremity weakness or numbness.  Pt denies polydipsia, polyuria, or low sugar episode.   Pt denies new neurological symptoms such as new headache, or facial or extremity weakness or numbness.   Pt states overall good compliance with meds, mostly trying to follow appropriate diet, with wt overall stable,  but no exercise however.  BP at home < 140/90 per pt.  Still with chronic pain to knees, for bilat knee surguey soon per Dr Wynelle Link  Needed to have a1c < 7.5. Will need post op anticoagulation for dvt prevention.  But also has hx of abd wall hematoma severe on ASA requiring surgical intervention, assoc with hypotension, not sure if she required transfusion.  Pt has never taken asa since, and no other bleeding episodes.  She is hesitant to start anticoag post knee surgury, but must have it done as the pain is too much Past Medical History:  Diagnosis Date  . DIVERTICULOSIS, COLON 09/10/2007  . DM 03/26/2008  . Glaucoma 03/17/2011  . HYPERLIPIDEMIA 09/10/2007  . HYPERTENSION 09/10/2007  . Hypertension   . Osteoarthritis 10/21/2011  . Shingles 03/17/2011   Past Surgical History:  Procedure Laterality Date  . ABDOMINAL HYSTERECTOMY    . HEMATOMA EVACUATION     2003; abdominal GI bleed 2nd-ary to Aspirin consumption  . TUBAL LIGATION      reports that she has never smoked. She has never used smokeless tobacco. She reports that she does not drink alcohol or use drugs. family history includes Hypertension in her father and mother. Allergies  Allergen Reactions  . Ivp Dye [Iodinated Diagnostic Agents]   . Sulfamethoxazole     REACTION: unspecified   Current Outpatient  Prescriptions on File Prior to Visit  Medication Sig Dispense Refill  . bimatoprost (LUMIGAN) 0.01 % SOLN 1 drop at bedtime.    . Calcium Carbonate (CALCIUM 600 PO) Take 1 capsule by mouth daily.    . Cholecalciferol (VITAMIN D) 2000 units CAPS Take 1 capsule by mouth daily.    Javier Docker Oil (MAXIMUM RED KRILL PO) Take by mouth daily.    [name] losartan (COZAAR) 100 MG tablet TAKE 1 TABLET (100 MG TOTAL) BY MOUTH DAILY. 90 tablet 3  . metFORMIN (GLUCOPHAGE) 500 MG tablet TAKE 1 TABLET (500 MG TOTAL) BY MOUTH 2 (TWO) TIMES DAILY WITH A MEAL. 180 tablet 3  . Misc Natural Products (TART CHERRY ADVANCED PO) Take 1 capsule by mouth daily.    . Multiple Vitamin (MULTIVITAMIN) tablet Take 1 tablet by mouth daily.    . simvastatin (ZOCOR) 40 MG tablet TAKE 1 TABLET (40 MG TOTAL) BY MOUTH DAILY. 90 tablet 3   Current Facility-Administered Medications on File Prior to Visit  Medication Dose Route Frequency Provider Last Rate Last Dose  . 0.9 %  sodium chloride infusion  500 mL Intravenous Continuous Ladene Artist, MD       Review of Systems  Constitutional: Negative for other unusual diaphoresis or sweats HENT: Negative for ear discharge or swelling Eyes: Negative for other worsening visual disturbances Respiratory: Negative for stridor or other swelling  Gastrointestinal: Negative for worsening distension or other blood Genitourinary: Negative  for retention or other urinary change Musculoskeletal: Negative for other MSK pain or swelling Skin: Negative for color change or other new lesions Neurological: Negative for worsening tremors and other numbness  Psychiatric/Behavioral: Negative for worsening agitation or other fatigue All other system neg per pt    Objective:   Physical Exam BP (!) 144/90   Pulse 83   Ht 5\' 7"  (1.702 m)   Wt 205 lb (93 kg)   SpO2 98%   BMI 32.11 kg/m  VS noted, not ill appearing Constitutional: Pt appears in NAD HENT: Head: NCAT.  Right Ear: External ear normal.    Left Ear: External ear normal.  Eyes: . Pupils are equal, round, and reactive to light. Conjunctivae and EOM are normal Nose: without d/c or deformity Neck: Neck supple. Gross normal ROM Cardiovascular: Normal rate and regular rhythm.   Pulmonary/Chest: Effort normal and breath sounds without rales or wheezing.  Abd:  Soft, NT, ND, + BS, no organomegaly, with large midline scar from previous procedure Neurological: Pt is alert. At baseline orientation, motor grossly intact Skin: Skin is warm. No rashes, other new lesions, no LE edema Psychiatric: Pt behavior is normal without agitation  No other exam findings  Lab Results  Component Value Date   WBC 10.7 (H) 07/05/2015   HGB 16.2 (H) 07/05/2015   HCT 47.3 (H) 07/05/2015   PLT 324.0 07/05/2015   GLUCOSE 177 (H) 05/31/2016   CHOL 121 05/31/2016   TRIG 180.0 (H) 05/31/2016   HDL 39.10 05/31/2016   LDLDIRECT 65.5 03/24/2012   LDLCALC 46 05/31/2016   ALT 17 05/31/2016   AST 18 05/31/2016   NA 137 05/31/2016   K 4.6 05/31/2016   CL 103 05/31/2016   CREATININE 0.76 05/31/2016   BUN 24 (H) 05/31/2016   CO2 25 05/31/2016   TSH 2.66 07/05/2015   INR 0.9 RATIO 04/09/2008   HGBA1C 7.0 (H) 05/31/2016   MICROALBUR <0.7 07/05/2015       Assessment & Plan:

## 2016-05-31 NOTE — Assessment & Plan Note (Addendum)
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

## 2016-06-01 ENCOUNTER — Other Ambulatory Visit: Payer: Self-pay | Admitting: Internal Medicine

## 2016-06-01 MED ORDER — GLUCOSE BLOOD VI STRP: ORAL_STRIP | 3 refills | Status: DC

## 2016-06-01 MED ORDER — LANCET DEVICE MISC: 3 refills | Status: DC

## 2016-06-01 MED ORDER — ONETOUCH ULTRA 2 W/DEVICE KIT: PACK | 0 refills | Status: DC

## 2016-06-26 ENCOUNTER — Other Ambulatory Visit: Payer: Self-pay | Admitting: Internal Medicine

## 2016-06-28 DIAGNOSIS — H40013 Open angle with borderline findings, low risk, bilateral: Secondary | ICD-10-CM | POA: Diagnosis not present

## 2016-06-29 ENCOUNTER — Other Ambulatory Visit: Payer: Self-pay | Admitting: Internal Medicine

## 2016-06-29 DIAGNOSIS — M170 Bilateral primary osteoarthritis of knee: Secondary | ICD-10-CM | POA: Diagnosis not present

## 2016-07-07 ENCOUNTER — Telehealth: Payer: Self-pay | Admitting: Hematology

## 2016-07-07 ENCOUNTER — Encounter: Payer: Self-pay | Admitting: Hematology

## 2016-07-07 NOTE — Telephone Encounter (Signed)
Appt has been scheduled for the pt to see Dr. Irene Limbo on 6/19 at 3pm. Unable to reach the pt. Messaged Lori from the referring to notify the pt of the appt. Letter mailed to the pt.

## 2016-07-09 ENCOUNTER — Other Ambulatory Visit: Payer: Self-pay | Admitting: Obstetrics & Gynecology

## 2016-07-09 DIAGNOSIS — R87620 Atypical squamous cells of undetermined significance on cytologic smear of vagina (ASC-US): Secondary | ICD-10-CM | POA: Diagnosis not present

## 2016-07-09 DIAGNOSIS — R87612 Low grade squamous intraepithelial lesion on cytologic smear of cervix (LGSIL): Secondary | ICD-10-CM | POA: Diagnosis not present

## 2016-07-09 DIAGNOSIS — L900 Lichen sclerosus et atrophicus: Secondary | ICD-10-CM | POA: Diagnosis not present

## 2016-07-09 DIAGNOSIS — Z1231 Encounter for screening mammogram for malignant neoplasm of breast: Secondary | ICD-10-CM | POA: Diagnosis not present

## 2016-07-11 DIAGNOSIS — D1801 Hemangioma of skin and subcutaneous tissue: Secondary | ICD-10-CM | POA: Diagnosis not present

## 2016-07-11 DIAGNOSIS — Z85828 Personal history of other malignant neoplasm of skin: Secondary | ICD-10-CM | POA: Diagnosis not present

## 2016-07-11 DIAGNOSIS — L739 Follicular disorder, unspecified: Secondary | ICD-10-CM | POA: Diagnosis not present

## 2016-07-11 DIAGNOSIS — L814 Other melanin hyperpigmentation: Secondary | ICD-10-CM | POA: Diagnosis not present

## 2016-07-11 DIAGNOSIS — L821 Other seborrheic keratosis: Secondary | ICD-10-CM | POA: Diagnosis not present

## 2016-07-11 DIAGNOSIS — B351 Tinea unguium: Secondary | ICD-10-CM | POA: Diagnosis not present

## 2016-07-12 LAB — CYTOLOGY - PAP

## 2016-07-24 ENCOUNTER — Ambulatory Visit (HOSPITAL_BASED_OUTPATIENT_CLINIC_OR_DEPARTMENT_OTHER): Payer: PPO | Admitting: Hematology

## 2016-07-24 ENCOUNTER — Telehealth: Payer: Self-pay | Admitting: Hematology

## 2016-07-24 ENCOUNTER — Encounter: Payer: Self-pay | Admitting: Hematology

## 2016-07-24 VITALS — BP 161/79 | HR 92 | Temp 97.8°F | Resp 18 | Ht 67.0 in | Wt 210.9 lb

## 2016-07-24 DIAGNOSIS — D699 Hemorrhagic condition, unspecified: Secondary | ICD-10-CM

## 2016-07-24 NOTE — Progress Notes (Signed)
[name]    HEMATOLOGY/ONCOLOGY CONSULTATION NOTE  Date of Service: 07/24/2016  Patient Care Team: Biagio Borg, MD as PCP - General  CHIEF COMPLAINTS/PURPOSE OF CONSULTATION:  Bleeding disorder  HISTORY OF PRESENTING ILLNESS:  [name] [name] is a wonderful 71 y.o. female who has been referred to [place] by Dr .Jenny Reichmann Hunt Oris, MD  for evaluation and management of ?bleeding disorder.  Patient has history of hypertension, dyslipidemia, osteoarthritis, diabetes who has some history of bleeding in the past and is being evaluated to undergo bilateral knee surgery and was referred to evaluate for any possible bleeding disorders.  Patient reports that in 2003 she had spontaneous intra-abdominal bleeding while on aspirin and was admitted to [facility] under the critical care service and required 3 units of PRBCs for transfusion. She notes that they could never find the source of the bleeding. She has not been on aspirin since.  Patient notes that she had a severe nosebleed in 2005 while traveling through [facility]. She notes that she required nose packing no cauterization .   Patient reports having some nosebleeds at age 5 years and then intermittently but never severe enough to require emergency room visit other than in 2005 as noted above.  She reports that she always had heavy periods however these were apparently thought to be related to fibroids. She notes that she had hysterectomy 1989 due to issues with bleeding.  Patient reports no issues with bleeding excessively from her surgery for hysterectomy or subsequently with extraction of wisdom teeth.  Patient notes no family history of bleeding disorder. No spontaneous skin bruising, gum bleeding or significant epistaxis currently. No history of intramuscular hematomas or hemarthrosis. Patient notes she otherwise feels quite well and has no other acute new concerns. She is not currently on any blood thinners.    MEDICAL HISTORY:  Past  Medical History:  Diagnosis Date  . DIVERTICULOSIS, COLON 09/10/2007  . DM 03/26/2008  . Glaucoma 03/17/2011  . HYPERLIPIDEMIA 09/10/2007  . HYPERTENSION 09/10/2007  . Hypertension   . Osteoarthritis 10/21/2011  . Shingles 03/17/2011    SURGICAL HISTORY: Past Surgical History:  Procedure Laterality Date  . ABDOMINAL HYSTERECTOMY    . HEMATOMA EVACUATION     2003; abdominal GI bleed 2nd-ary to Aspirin consumption  . TUBAL LIGATION      SOCIAL HISTORY: Social History   Social History  . Marital status: Married    Spouse name: N/A  . Number of children: N/A  . Years of education: N/A   Occupational History  . Not on file.   Social History Main Topics  . Smoking status: Never Smoker  . Smokeless tobacco: Never Used  . Alcohol use Yes     Comment: occ: social  . Drug use: No  . Sexual activity: Not on file   Other Topics Concern  . Not on file   Social History Narrative  . No narrative on file    FAMILY HISTORY: Family History  Problem Relation Age of Onset  . Hypertension Mother   . Hypertension Father   . Colon cancer Neg Hx     ALLERGIES:  is allergic to ivp dye [iodinated diagnostic agents] and sulfamethoxazole.  MEDICATIONS:  Current Outpatient Prescriptions  Medication Sig Dispense Refill  . bimatoprost (LUMIGAN) 0.01 % SOLN 1 drop at bedtime.    . Blood Glucose Monitoring Suppl (ONE TOUCH ULTRA 2) w/Device KIT Use as directed  E11.9 1 each 0  . Calcium Carbonate (CALCIUM 600 PO)  Take 1 capsule by mouth daily.    . Cholecalciferol (VITAMIN D) 2000 units CAPS Take 1 capsule by mouth daily.    [name] glucose blood (ONE TOUCH ULTRA TEST) test strip Use as instructed three times per day  E11.9 300 each 3  . Krill Oil (MAXIMUM RED KRILL PO) Take by mouth daily.    Elmore Guise Device MISC Use as directed three times daily E11.9 300 each 3  . losartan (COZAAR) 100 MG tablet TAKE ONE TABLET BY MOUTH DAILY 90 tablet 3  . metFORMIN (GLUCOPHAGE) 500 MG tablet TAKE ONE TABLET BY  MOUTH TWO TIMES A DAY WITH A MEAL 180 tablet 2  . Misc Natural Products (TART CHERRY ADVANCED PO) Take 1 capsule by mouth daily.    . Multiple Vitamin (MULTIVITAMIN) tablet Take 1 tablet by mouth daily.    . simvastatin (ZOCOR) 40 MG tablet TAKE ONE TABLET BY MOUTH DAILY 90 tablet 3   Current Facility-Administered Medications  Medication Dose Route Frequency Provider Last Rate Last Dose  . 0.9 %  sodium chloride infusion  500 mL Intravenous Continuous Ladene Artist, MD        REVIEW OF SYSTEMS:    10 Point review of Systems was done is negative except as noted above.  PHYSICAL EXAMINATION: ECOG PERFORMANCE STATUS: 0 - Asymptomatic  . Vitals:   07/24/16 1502  BP: (!) 161/79  Pulse: 92  Resp: 18  Temp: 97.8 F (36.6 C)   Filed Weights   07/24/16 1502  Weight: 210 lb 14.4 oz (95.7 kg)   .Body mass index is 33.03 kg/m.  GENERAL:alert, in no acute distress and comfortable SKIN: no acute rashes, no significant lesions EYES: conjunctiva are pink and non-injected, sclera anicteric OROPHARYNX: MMM, no exudates, no oropharyngeal erythema or ulceration NECK: supple, no JVD LYMPH:  no palpable lymphadenopathy in the cervical, axillary or inguinal regions LUNGS: clear to auscultation b/l with normal respiratory effort HEART: regular rate & rhythm ABDOMEN:  normoactive bowel sounds , non tender, not distended. Extremity: no pedal edema PSYCH: alert & oriented x 3 with fluent speech NEURO: no focal motor/sensory deficits  LABORATORY DATA:  I have reviewed the data as listed  . CBC Latest Ref Rng & Units 07/27/2016 07/05/2015 06/24/2013  WBC 3.9 - 10.3 10e3/uL 7.8 10.7(H) 8.7  Hemoglobin 11.6 - 15.9 g/dL 14.5 16.2(H) 15.5(H)  Hematocrit 34.8 - 46.6 % 43.9 47.3(H) 45.9  Platelets 145 - 400 10e3/uL 260 324.0 293.0    . CMP Latest Ref Rng & Units 07/27/2016 05/31/2016 02/13/2016  Glucose 70 - 140 mg/dl 154(H) 177(H) 178(H)  BUN 7.0 - 26.0 mg/dL 25.9 24(H) 22  Creatinine 0.6 -  1.1 mg/dL 0.9 0.76 0.77  Sodium 136 - 145 mEq/L 140 137 137  Potassium 3.5 - 5.1 mEq/L 4.3 4.6 4.5  Chloride 96 - 112 mEq/L - 103 104  CO2 22 - 29 mEq/L 21(L) 25 22  Calcium 8.4 - 10.4 mg/dL 9.7 9.4 9.4  Total Protein 6.4 - 8.3 g/dL 7.0 6.9 7.1  Total Bilirubin 0.20 - 1.20 mg/dL 1.42(H) 1.0 1.1  Alkaline Phos 40 - 150 U/L 73 59 63  AST 5 - 34 U/L '24 18 24  ' ALT 0 - 55 U/L '24 17 25   ' Component     Latest Ref Rng & Units 07/27/2016  WBC     3.9 - 10.3 10e3/uL 7.8  NEUT#     1.5 - 6.5 10e3/uL 4.8  Hemoglobin     11.6 -  15.9 g/dL 14.5  HCT     34.8 - 46.6 % 43.9  Platelets     145 - 400 10e3/uL 260  MCV     79.5 - 101.0 fL 94.2  MCH     25.1 - 34.0 pg 31.1  MCHC     31.5 - 36.0 g/dL 33.0  RBC     3.70 - 5.45 10e6/uL 4.66  RDW     11.2 - 14.5 % 12.5  lymph#     0.9 - 3.3 10e3/uL 2.3  MONO#     0.1 - 0.9 10e3/uL 0.5  Eosinophils Absolute     0.0 - 0.5 10e3/uL 0.2  Basophils Absolute     0.0 - 0.1 10e3/uL 0.0  NEUT%     38.4 - 76.8 % 61.3  LYMPH%     14.0 - 49.7 % 30.0  MONO%     0.0 - 14.0 % 5.8  EOS%     0.0 - 7.0 % 2.4  BASO%     0.0 - 2.0 % 0.5  Retic %     0.70 - 2.10 % 2.22 (H)  Retic Ct Abs     33.70 - 90.70 10e3/uL 103.45 (H)  Immature Retic Fract     1.60 - 10.00 % 7.70  Sodium     136 - 145 mEq/L 140  Potassium     3.5 - 5.1 mEq/L 4.3  Chloride     98 - 109 mEq/L 105  CO2     22 - 29 mEq/L 21 (L)  Glucose     70 - 140 mg/dl 154 (H)  BUN     7.0 - 26.0 mg/dL 25.9  Creatinine     0.6 - 1.1 mg/dL 0.9  Total Bilirubin     0.20 - 1.20 mg/dL 1.42 (H)  Alkaline Phosphatase     40 - 150 U/L 73  AST     5 - 34 U/L 24  ALT     0 - 55 U/L 24  Total Protein     6.4 - 8.3 g/dL 7.0  Albumin     3.5 - 5.0 g/dL 4.2  Calcium     8.4 - 10.4 mg/dL 9.7  Anion gap     3 - 11 mEq/L 14 (H)  EGFR     >90 ml/min/1.73 m2 65 (L)  Factor VIII Activity     57 - 163 % 98  von Willebrand Factor (vWF) Ag     50 - 200 % 122  vWF Activity     50 - 200 %  67  Interpretation      Note  Protime     10.6 - 13.4 Seconds 10.8  INR     2.00 - 3.50 0.90 (L)  Lovenox      No  PFA Interpretation        Collagen / Epinephrine     0 - 193 seconds 189  Fibrinogen     193 - 507 mg/dL 284  APTT     24 - 33 sec 23 (L)  PFA to [facility]      Specimen sent to [facility] lab for testing. See results in EPIC.   on Willebrand panel      No reference range information available      Resulting Lab: [facility]      Comments:           -------------------------------  COAGULATION:           VON WILLEBRAND FACTOR ASSESSMENT CURRENT RESULTS ASSESSMENT           The VWF:Ag is normal. The VWF:RCo is normal. The FVIII is           normal.           VON WILLEBRAND FACTOR ASSESSMENT CURRENT RESULTS           INTERPRETATION           -           These results are not consistent with a diagnosis of Von Willebrands disease   PFA Interpretation       Comment: Platelet function is normal.  If patient history/physical  examination give strong indication  of a bleeding disorder repeat  testing for confirmation.     RADIOGRAPHIC STUDIES: I have personally reviewed the radiological images as listed and agreed with the findings in the report. No results found.  ASSESSMENT & PLAN:   71 year old Caucasian female with previous history of spontaneous intra-abdominal bleeding likely from aspirin, some unclear history of epistaxis and heavy periods likely from fibroids. She has not had excessive operative bleeding with previous procedures including her hysterectomy and wisdom teeth extractions. #1 Rule out bleeding diathesis Patient is undergoing a planned knee surgery and we were asked to rule out a bleeding disorder .  Platelet numbers and platelet function assay within normal limits . Von Willebrand panel results not suggestive of VWD  PT/INR and PTT -  do not suggest any coagulation disorders .  Plan  -No overt evidence of a  bleeding disorder based on extensive lab testing done . -Cannot rule out defect with the fibrinolytic system though this typically should not preclude the planned surgical intervention and is not easy to test for. -Patient may proceed for her planned knee surgery without any additional testing or intervention from a bleeding diathesis standpoint . -Would avoid excessive NSAID use given previous history of significant bleeding on aspirin . -Appropriate VTE prophylaxis in keeping with the type of surgery planned.  All of the patients questions were answered with apparent satisfaction. The patient knows to call the clinic with any problems, questions or concerns.  I spent 40 minutes counseling the patient face to face. The total time spent in the appointment was 60 minutes and more than 50% was on counseling and direct patient cares.    Sullivan Lone MD [place] AAHIVMS [facility] Hematology/Oncology Physician [facility]  (Office):       [phone] (Work cell):  [phone] (Fax):           [phone]  07/24/2016 3:22 PM

## 2016-07-24 NOTE — Patient Instructions (Signed)
Thank you for choosing [facility] to provide your oncology and hematology care.  To afford each patient quality time with our providers, please arrive 30 minutes before your scheduled appointment time.  If you arrive late for your appointment, you may be asked to reschedule.  We strive to give you quality time with our providers, and arriving late affects you and other patients whose appointments are after yours.  If you are a no show for multiple scheduled visits, you may be dismissed from the clinic at the providers discretion.   Again, thank you for choosing [facility], our hope is that these requests will decrease the amount of time that you wait before being seen by our physicians.  ______________________________________________________________________ Should you have questions after your visit to the [facility], please contact our office at [phone] between the hours of 8:30 and 4:30 p.m.    Voicemails left after 4:30p.m will not be returned until the following business day.   For prescription refill requests, please have your pharmacy contact us directly.  Please also try to allow 48 hours for prescription requests.   Please contact the scheduling department for questions regarding scheduling.  For scheduling of procedures such as PET scans, CT scans, MRI, Ultrasound, etc please contact central scheduling at [phone].   Resources For Cancer Patients and Caregivers:  American Cancer Society:  (680)102-7153  Can help patients locate various types of support and financial assistance Cancer Care: 1-800-813-HOPE 269-668-4400) Provides financial assistance, online support groups, medication/co-pay assistance.   [place]:  534-426-6853 Where to apply for food stamps, Medicaid, and utility assistance Medicare Rights Center: 830-223-9197 Helps people with Medicare understand their rights and benefits, navigate the Medicare system, and secure the  quality healthcare they deserve SCAT: [employer]'s shared-ride transportation service for eligible riders who have a disability that prevents them from riding the fixed route bus.

## 2016-07-24 NOTE — Telephone Encounter (Signed)
Scheduled appt per 6/19 los- unable to schedule lab today due to lab closed - patient aware off appt time and date. Gave calender and AVS.

## 2016-07-27 ENCOUNTER — Other Ambulatory Visit (HOSPITAL_COMMUNITY)
Admission: RE | Admit: 2016-07-27 | Discharge: 2016-07-27 | Disposition: A | Discharge status: Home / Self Care | Payer: PPO | Source: Other Acute Inpatient Hospital | Attending: Hematology | Admitting: Hematology

## 2016-07-27 ENCOUNTER — Ambulatory Visit (HOSPITAL_BASED_OUTPATIENT_CLINIC_OR_DEPARTMENT_OTHER): Payer: PPO

## 2016-07-27 DIAGNOSIS — D699 Hemorrhagic condition, unspecified: Secondary | ICD-10-CM | POA: Diagnosis not present

## 2016-07-27 LAB — PLATELET FUNCTION ASSAY: Collagen / Epinephrine: 189 seconds (ref 0–193)

## 2016-07-27 LAB — COMPREHENSIVE METABOLIC PANEL
ALK PHOS: 73 U/L (ref 40–150)
ALT: 24 U/L (ref 0–55)
AST: 24 U/L (ref 5–34)
Albumin: 4.2 g/dL (ref 3.5–5.0)
Anion Gap: 14 mEq/L — ABNORMAL HIGH (ref 3–11)
BILIRUBIN TOTAL: 1.42 mg/dL — AB (ref 0.20–1.20)
BUN: 25.9 mg/dL (ref 7.0–26.0)
CALCIUM: 9.7 mg/dL (ref 8.4–10.4)
CO2: 21 mEq/L — ABNORMAL LOW (ref 22–29)
CREATININE: 0.9 mg/dL (ref 0.6–1.1)
Chloride: 105 mEq/L (ref 98–109)
EGFR: 65 mL/min/{1.73_m2} — ABNORMAL LOW (ref >90)
Glucose: 154 mg/dl — ABNORMAL HIGH (ref 70–140)
Potassium: 4.3 mEq/L (ref 3.5–5.1)
Sodium: 140 mEq/L (ref 136–145)
TOTAL PROTEIN: 7 g/dL (ref 6.4–8.3)

## 2016-07-27 LAB — CBC & DIFF AND RETIC
BASO%: 0.5 % (ref 0.0–2.0)
BASOS ABS: 0 10*3/uL (ref 0.0–0.1)
EOS%: 2.4 % (ref 0.0–7.0)
Eosinophils Absolute: 0.2 10*3/uL (ref 0.0–0.5)
HEMATOCRIT: 43.9 % (ref 34.8–46.6)
HGB: 14.5 g/dL (ref 11.6–15.9)
IMMATURE RETIC FRACT: 7.7 % (ref 1.60–10.00)
LYMPH#: 2.3 10*3/uL (ref 0.9–3.3)
LYMPH%: 30 % (ref 14.0–49.7)
MCH: 31.1 pg (ref 25.1–34.0)
MCHC: 33 g/dL (ref 31.5–36.0)
MCV: 94.2 fL (ref 79.5–101.0)
MONO#: 0.5 10*3/uL (ref 0.1–0.9)
MONO%: 5.8 % (ref 0.0–14.0)
NEUT#: 4.8 10*3/uL (ref 1.5–6.5)
NEUT%: 61.3 % (ref 38.4–76.8)
PLATELETS: 260 10*3/uL (ref 145–400)
RBC: 4.66 10*6/uL (ref 3.70–5.45)
RDW: 12.5 % (ref 11.2–14.5)
RETIC CT ABS: 103.45 10*3/uL — AB (ref 33.70–90.70)
Retic %: 2.22 % — ABNORMAL HIGH (ref 0.70–2.10)
WBC: 7.8 10*3/uL (ref 3.9–10.3)

## 2016-07-27 LAB — PROTIME-INR
INR: 0.9 — AB (ref 2.00–3.50)
PROTIME: 10.8 s (ref 10.6–13.4)

## 2016-07-28 LAB — VON WILLEBRAND PANEL
Factor VIII Activity: 98 % (ref 57–163)
VON WILLEBRAND FACTOR: 67 % (ref 50–200)
von Willebrand Factor (vWF) Ag: 122 % (ref 50–200)

## 2016-07-28 LAB — APTT: aPTT: 23 s — ABNORMAL LOW (ref 24–33)

## 2016-07-28 LAB — FIBRINOGEN: FIBRINOGEN: 284 mg/dL (ref 193–507)

## 2016-07-30 LAB — PLATELET FUNCTION ASSAY

## 2016-08-15 ENCOUNTER — Ambulatory Visit: Payer: PPO | Admitting: Internal Medicine

## 2016-09-03 ENCOUNTER — Ambulatory Visit: Payer: Self-pay | Admitting: Orthopedic Surgery

## 2016-09-17 ENCOUNTER — Other Ambulatory Visit (HOSPITAL_COMMUNITY): Payer: Self-pay | Admitting: Emergency Medicine

## 2016-09-17 NOTE — Patient Instructions (Signed)
[name] [name]  09/17/2016   Your procedure is scheduled on: 09-26-16  Report to [facility] Main  Entrance   Report to admitting at [place]   Call this number if you have problems the morning of surgery [phone]   Remember: ONLY 1 PERSON MAY GO WITH YOU TO SHORT STAY TO GET  READY MORNING OF YOUR SURGERY.  Do not eat food or drink liquids :After Midnight.     Take these medicines the morning of surgery with A SIP OF WATER: simvastatin(zocor)   DO NOT TAKE ANY DIABETIC MEDICATIONS DAY OF YOUR SURGERY                               You may not have any metal on your body including hair pins and              piercings  Do not wear jewelry, make-up, lotions, powders or perfumes, deodorant             Do not wear nail polish.  Do not shave  48 hours prior to surgery.      Do not bring valuables to the hospital. [place].  Contacts, dentures or bridgework may not be worn into surgery.  Leave suitcase in the car. After surgery it may be brought to your room.               Please read over the following fact sheets you were given: _____________________________________________________________________            How to Manage Your Diabetes Before and After Surgery  Why is it important to control my blood sugar before and after surgery? . Improving blood sugar levels before and after surgery helps healing and can limit problems. . A way of improving blood sugar control is eating a healthy diet by: o  Eating less sugar and carbohydrates o  Increasing activity/exercise o  Talking with your doctor about reaching your blood sugar goals . High blood sugars (greater than 180 mg/dL) can raise your risk of infections and slow your recovery, so you will need to focus on controlling your diabetes during the weeks before surgery. . Make sure that the doctor who takes care of your diabetes knows about your planned surgery  including the date and location.  How do I manage my blood sugar before surgery? . Check your blood sugar at least 4 times a day, starting 2 days before surgery, to make sure that the level is not too high or low. o Check your blood sugar the morning of your surgery when you wake up and every 2 hours until you get to the Short Stay unit. . If your blood sugar is less than 70 mg/dL, you will need to treat for low blood sugar: o Do not take insulin. o Treat a low blood sugar (less than 70 mg/dL) with  cup of clear juice (cranberry or apple), 4 glucose tablets, OR glucose gel. o Recheck blood sugar in 15 minutes after treatment (to make sure it is greater than 70 mg/dL). If your blood sugar is not greater than 70 mg/dL on recheck, call [phone] for further instructions. . Report your blood sugar to the short stay nurse when you get  to Short Stay.  . If you are admitted to the hospital after surgery: o Your blood sugar will be checked by the staff and you will probably be given insulin after surgery (instead of oral diabetes medicines) to make sure you have good blood sugar levels. o The goal for blood sugar control after surgery is 80-180 mg/dL.   WHAT DO I DO ABOUT MY DIABETES MEDICATION?   . THE DAY BEFORE SURGERY, take METFORMIN as normal       . THE MORNING OF SURGERY, Do not take oral diabetes medicines (pills)    Patient Signature:  Date:   Nurse Signature:  Date:   Reviewed and Endorsed by [place] Patient Education Committee, August 2015  [facility] - Preparing for Surgery Before surgery, you can play an important role.  Because skin is not sterile, your skin needs to be as free of germs as possible.  You can reduce the number of germs on your skin by washing with CHG (chlorahexidine gluconate) soap before surgery.  CHG is an antiseptic cleaner which kills germs and bonds with the skin to continue killing germs even after washing. Please DO NOT use if you have an  allergy to CHG or antibacterial soaps.  If your skin becomes reddened/irritated stop using the CHG and inform your nurse when you arrive at Short Stay. Do not shave (including legs and underarms) for at least 48 hours prior to the first CHG shower.  You may shave your face/neck. Please follow these instructions carefully:  1.  Shower with CHG Soap the night before surgery and the  morning of Surgery.  2.  If you choose to wash your hair, wash your hair first as usual with your  normal  shampoo.  3.  After you shampoo, rinse your hair and body thoroughly to remove the  shampoo.                           4.  Use CHG as you would any other liquid soap.  You can apply chg directly  to the skin and wash                       Gently with a scrungie or clean washcloth.  5.  Apply the CHG Soap to your body ONLY FROM THE NECK DOWN.   Do not use on face/ open                           Wound or open sores. Avoid contact with eyes, ears mouth and genitals (private parts).                       Wash face,  Genitals (private parts) with your normal soap.             6.  Wash thoroughly, paying special attention to the area where your surgery  will be performed.  7.  Thoroughly rinse your body with warm water from the neck down.  8.  DO NOT shower/wash with your normal soap after using and rinsing off  the CHG Soap.                9.  Pat yourself dry with a clean towel.            10.  Wear clean pajamas.  11.  Place clean sheets on your bed the night of your first shower and do not  sleep with pets. Day of Surgery : Do not apply any lotions/deodorants the morning of surgery.  Please wear clean clothes to the hospital/surgery center.  FAILURE TO FOLLOW THESE INSTRUCTIONS MAY RESULT IN THE CANCELLATION OF YOUR SURGERY PATIENT SIGNATURE_________________________________  NURSE  SIGNATURE__________________________________  ________________________________________________________________________   Adam Phenix  An incentive spirometer is a tool that can help keep your lungs clear and active. This tool measures how well you are filling your lungs with each breath. Taking long deep breaths may help reverse or decrease the chance of developing breathing (pulmonary) problems (especially infection) following:  A long period of time when you are unable to move or be active. BEFORE THE PROCEDURE   If the spirometer includes an indicator to show your [place] effort, your nurse or respiratory therapist will set it to a desired goal.  If possible, sit up straight or lean slightly forward. Try not to slouch.  Hold the incentive spirometer in an upright position. INSTRUCTIONS FOR USE  1. Sit on the edge of your bed if possible, or sit up as far as you can in bed or on a chair. 2. Hold the incentive spirometer in an upright position. 3. Breathe out normally. 4. Place the mouthpiece in your mouth and seal your lips tightly around it. 5. Breathe in slowly and as deeply as possible, raising the piston or the ball toward the top of the column. 6. Hold your breath for 3-5 seconds or for as long as possible. Allow the piston or ball to fall to the bottom of the column. 7. Remove the mouthpiece from your mouth and breathe out normally. 8. Rest for a few seconds and repeat Steps 1 through 7 at least 10 times every 1-2 hours when you are awake. Take your time and take a few normal breaths between deep breaths. 9. The spirometer may include an indicator to show your [place] effort. Use the indicator as a goal to work toward during each repetition. 10. After each set of 10 deep breaths, practice coughing to be sure your lungs are clear. If you have an incision (the cut made at the time of surgery), support your incision when coughing by placing a pillow or rolled up towels firmly  against it. Once you are able to get out of bed, walk around indoors and cough well. You may stop using the incentive spirometer when instructed by your caregiver.  RISKS AND COMPLICATIONS  Take your time so you do not get dizzy or light-headed.  If you are in pain, you may need to take or ask for pain medication before doing incentive spirometry. It is harder to take a deep breath if you are having pain. AFTER USE  Rest and breathe slowly and easily.  It can be helpful to keep track of a log of your progress. Your caregiver can provide you with a simple table to help with this. If you are using the spirometer at home, follow these instructions: [place] IF:   You are having difficultly using the spirometer.  You have trouble using the spirometer as often as instructed.  Your pain medication is not giving enough relief while using the spirometer.  You develop fever of 100.5 F (38.1 C) or higher. SEEK IMMEDIATE MEDICAL CARE IF:   You cough up bloody sputum that had not been present before.  You develop fever of 102 F (38.9 C)  or greater.  You develop worsening pain at or near the incision site. MAKE SURE YOU:   Understand these instructions.  Will watch your condition.  Will get help right away if you are not doing well or get worse. Document Released: 06/04/2006 Document Revised: 04/16/2011 Document Reviewed: 08/05/2006 ExitCare Patient Information 2014 ExitCare, [place].   ________________________________________________________________________  WHAT IS A BLOOD TRANSFUSION? Blood Transfusion Information  A transfusion is the replacement of blood or some of its parts. Blood is made up of multiple cells which provide different functions.  Red blood cells carry oxygen and are used for blood loss replacement.  White blood cells fight against infection.  Platelets control bleeding.  Plasma helps clot blood.  Other blood products are available for  specialized needs, such as hemophilia or other clotting disorders. BEFORE THE TRANSFUSION  Who gives blood for transfusions?   Healthy volunteers who are fully evaluated to make sure their blood is safe. This is blood bank blood. Transfusion therapy is the safest it has ever been in the practice of medicine. Before blood is taken from a donor, a complete history is taken to make sure that person has no history of diseases nor engages in risky social behavior (examples are intravenous drug use or sexual activity with multiple partners). The donor's travel history is screened to minimize risk of transmitting infections, such as malaria. The donated blood is tested for signs of infectious diseases, such as HIV and hepatitis. The blood is then tested to be sure it is compatible with you in order to minimize the chance of a transfusion reaction. If you or a relative donates blood, this is often done in anticipation of surgery and is not appropriate for emergency situations. It takes many days to process the donated blood. RISKS AND COMPLICATIONS Although transfusion therapy is very safe and saves many lives, the main dangers of transfusion include:   Getting an infectious disease.  Developing a transfusion reaction. This is an allergic reaction to something in the blood you were given. Every precaution is taken to prevent this. The decision to have a blood transfusion has been considered carefully by your caregiver before blood is given. Blood is not given unless the benefits outweigh the risks. AFTER THE TRANSFUSION  Right after receiving a blood transfusion, you will usually feel much better and more energetic. This is especially true if your red blood cells have gotten low (anemic). The transfusion raises the level of the red blood cells which carry oxygen, and this usually causes an energy increase.  The nurse administering the transfusion will monitor you carefully for complications. HOME CARE  INSTRUCTIONS  No special instructions are needed after a transfusion. You may find your energy is better. Speak with your caregiver about any limitations on activity for underlying diseases you may have. SEEK MEDICAL CARE IF:   Your condition is not improving after your transfusion.  You develop redness or irritation at the intravenous (IV) site. SEEK IMMEDIATE MEDICAL CARE IF:  Any of the following symptoms occur over the next 12 hours:  Shaking chills.  You have a temperature by mouth above 102 F (38.9 C), not controlled by medicine.  Chest, back, or muscle pain.  People around you feel you are not acting correctly or are confused.  Shortness of breath or difficulty breathing.  Dizziness and fainting.  You get a rash or develop hives.  You have a decrease in urine output.  Your urine turns a dark color or changes to pink, red,  or brown. Any of the following symptoms occur over the next 10 days:  You have a temperature by mouth above 102 F (38.9 C), not controlled by medicine.  Shortness of breath.  Weakness after normal activity.  The white part of the eye turns yellow (jaundice).  You have a decrease in the amount of urine or are urinating less often.  Your urine turns a dark color or changes to pink, red, or brown. Document Released: 01/20/2000 Document Revised: 04/16/2011 Document Reviewed: 09/08/2007 [facility] Patient Information 2014 [place], [place].  _______________________________________________________________________

## 2016-09-19 ENCOUNTER — Encounter (HOSPITAL_COMMUNITY)
Admission: RE | Admit: 2016-09-19 | Discharge: 2016-09-19 | Disposition: A | Discharge status: Home / Self Care | Payer: PPO | Source: Ambulatory Visit | Attending: Orthopedic Surgery | Admitting: Orthopedic Surgery

## 2016-09-19 ENCOUNTER — Ambulatory Visit: Payer: Self-pay | Admitting: Orthopedic Surgery

## 2016-09-19 ENCOUNTER — Encounter (HOSPITAL_COMMUNITY): Payer: Self-pay

## 2016-09-19 DIAGNOSIS — Z01810 Encounter for preprocedural cardiovascular examination: Secondary | ICD-10-CM | POA: Insufficient documentation

## 2016-09-19 DIAGNOSIS — Z01812 Encounter for preprocedural laboratory examination: Secondary | ICD-10-CM | POA: Diagnosis not present

## 2016-09-19 DIAGNOSIS — I10 Essential (primary) hypertension: Secondary | ICD-10-CM | POA: Insufficient documentation

## 2016-09-19 DIAGNOSIS — M170 Bilateral primary osteoarthritis of knee: Secondary | ICD-10-CM | POA: Diagnosis not present

## 2016-09-19 DIAGNOSIS — E119 Type 2 diabetes mellitus without complications: Secondary | ICD-10-CM | POA: Insufficient documentation

## 2016-09-19 DIAGNOSIS — E785 Hyperlipidemia, unspecified: Secondary | ICD-10-CM | POA: Diagnosis not present

## 2016-09-19 LAB — ABO/RH: ABO/RH(D): O POS

## 2016-09-19 LAB — COMPREHENSIVE METABOLIC PANEL
ALT: 18 U/L (ref 14–54)
ANION GAP: 8 (ref 5–15)
AST: 26 U/L (ref 15–41)
Albumin: 4.3 g/dL (ref 3.5–5.0)
Alkaline Phosphatase: 60 U/L (ref 38–126)
BILIRUBIN TOTAL: 1.1 mg/dL (ref 0.3–1.2)
BUN: 26 mg/dL — ABNORMAL HIGH (ref 6–20)
CHLORIDE: 105 mmol/L (ref 101–111)
CO2: 25 mmol/L (ref 22–32)
Calcium: 9 mg/dL (ref 8.9–10.3)
Creatinine, Ser: 0.83 mg/dL (ref 0.44–1.00)
Glucose, Bld: 145 mg/dL — ABNORMAL HIGH (ref 65–99)
POTASSIUM: 4.6 mmol/L (ref 3.5–5.1)
Sodium: 138 mmol/L (ref 135–145)
TOTAL PROTEIN: 7.3 g/dL (ref 6.5–8.1)

## 2016-09-19 LAB — SURGICAL PCR SCREEN
MRSA, PCR: NEGATIVE
STAPHYLOCOCCUS AUREUS: NEGATIVE

## 2016-09-19 LAB — APTT: aPTT: 27 seconds (ref 24–36)

## 2016-09-19 LAB — GLUCOSE, CAPILLARY: GLUCOSE-CAPILLARY: 140 mg/dL — AB (ref 65–99)

## 2016-09-19 LAB — CBC
HEMATOCRIT: 43.7 % (ref 36.0–46.0)
Hemoglobin: 15 g/dL (ref 12.0–15.0)
MCH: 31.2 pg (ref 26.0–34.0)
MCHC: 34.3 g/dL (ref 30.0–36.0)
MCV: 90.9 fL (ref 78.0–100.0)
PLATELETS: 243 10*3/uL (ref 150–400)
RBC: 4.81 MIL/uL (ref 3.87–5.11)
RDW: 12.2 % (ref 11.5–15.5)
WBC: 7.8 10*3/uL (ref 4.0–10.5)

## 2016-09-19 LAB — PROTIME-INR
INR: 0.88
PROTHROMBIN TIME: 11.9 s (ref 11.4–15.2)

## 2016-09-19 NOTE — Progress Notes (Signed)
PST appt complete. Per patient, Aluisio to do left knee first. Therefore consent in epic listing "bilateral tka" incorrect? Please have Aluisio review and if needed, place new consent order in epic for patient to sign day of surgery . Thank you!

## 2016-09-19 NOTE — Progress Notes (Signed)
New orders for LEFT TOTAL KNEE ARTHROPLASTY have been placed into the computer for surgery on 09/26/2016. Drew Perkins, PA-C  

## 2016-09-20 LAB — HEMOGLOBIN A1C
HEMOGLOBIN A1C: 6.6 % — AB (ref 4.8–5.6)
Mean Plasma Glucose: 143 mg/dL

## 2016-09-25 ENCOUNTER — Ambulatory Visit: Payer: Self-pay | Admitting: Orthopedic Surgery

## 2016-09-25 NOTE — H&P (Signed)
[name] [name] DOB: 12-Mar-1945 Married / Language: English / Race: White Female Date of Admission:  09/26/2016 CC:  Bilateral knee pain History of Present Illness  The patient is a 71 year old female who comes in for a preoperative History and Physical. The patient is scheduled for a left total knee arthroplasty to be performed by Dr. Dione Plover. Aluisio, MD at [facility] on 09/26/2016. The patient is a 71 year old female who presented for follow up of their knee. The patient is being followed for their bilateral knee pain and osteoarthritis. Symptoms reported include: pain, aching, instability and difficulty ambulating. The patient feels that they are doing poorly. Current treatment includes: home exercise program. The following medication has been used for pain control: Tylenol (Arthritis). She has had problems of both knees for many years now. Both knees hurt equally. They are now at a stage where they are significantly limiting what she can and cannot do. She does not get swelling routinely, but occasionally will. They occasionally feel like they are going to give out on her. She is at a stage now where the knees have essentially taken over her life. She saw Richardson Landry recently and she has been scheduled for bilateral total knees. She has had injections in the past and they no longer have been beneficial. She was scheduled for bilateral total knee replacements but has elected to proceed with only one knee at this time. She has requested to proceed with the left knee. They have been treated conservatively in the past for the above stated problem and despite conservative measures, they continue to have progressive pain and severe functional limitations and dysfunction. They have failed non-operative management including home exercise, medications, and injections. It is felt that they would benefit from undergoing total joint replacement. Risks and benefits of the procedure have been discussed with  the patient and they elect to proceed with surgery. There are no active contraindications to surgery such as ongoing infection or rapidly progressive neurological disease.    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 father Chronic Obstructive Lung Disease  Father. Hypertension  Father, Mother. mother, father and sister Osteoarthritis  Father, Mother. mother, father and sister  Social History Children  2 Alcohol use  current drinker; only occasionally per week Pain Contract  no Current drinker  05/30/2016: Currently drinks wine less than 5 times per week 05/29/2014: Currently drinks wine and hard liquor less than 5 times per week Current work status  working part time Drug/Alcohol Rehab (Currently)  no Exercise  Exercises rarely; does gym / [employer] Exercises daily; does running / walking Exercises weekly; does gym / weights Living situation  live with spouse Marital status  married Illicit drug use  no No history of drug/alcohol rehab  Not under pain contract  Most recent primary occupation  Herbalist Tobacco / smoke exposure  05/30/2016: no Tobacco use  Never smoker. 05/30/2016 05/29/2014 never smoker Previously in rehab  no  Medication History  MetFORMIN HCl (Oral) Specific strength unknown - Active. Lumigan (0.01% Solution, Ophthalmic) Active. Krill Oil (300MG  Capsule, Oral) Active. MegaRed Omega-3 Krill Oil (500MG  Capsule, Oral) Active. Mega Red Active. (Krill Oil and  for joints) MetFORMIN HCl ER (MOD) (Oral) Specific strength unknown - Active. Tylenol Arthritis  (prn for her knees) Active. Losartan Potassium (Oral) Specific strength unknown - Active. Simvastatin (Oral) Specific strength unknown - Active.  Past Surgical History  Dilation and Curettage of Uterus  Dilation and Curettage of Uterus - Multiple  Hemorrhoidectomy  Hysterectomy  partial (non-cancerous) complete (non-cancerous) Tubal Ligation   Review of Systems  Constitutional: Negative.   HENT: Negative.   Respiratory: Negative.   Cardiovascular: Negative.   Gastrointestinal: Negative.   Musculoskeletal: Positive for joint pain.    Vitals Ht - 68 inches Wt - 205 lbs BP - 141/83  Physical Exam [name] Mental Status -Alert, cooperative and good historian. [name] Appearance-pleasant, Not in acute distress. Orientation-Oriented X3. Build & Nutrition-Well nourished and Well developed.  Head and Neck Head-normocephalic, atraumatic . Neck Global Assessment - supple, no bruit auscultated on the right, no bruit auscultated on the left.  Eye Vision-Wears corrective lenses. Pupil - Bilateral-Regular and Round. Motion - Bilateral-EOMI.  Chest and Lung Exam Auscultation Breath sounds - clear at anterior chest wall and clear at posterior chest wall. Adventitious sounds - No Adventitious sounds.  Cardiovascular Auscultation Rhythm - Regular rate and rhythm. Heart Sounds - S1 WNL and S2 WNL. Murmurs & Other Heart Sounds - Auscultation of the heart reveals - No Murmurs.  Abdomen Palpation/Percussion Tenderness - Abdomen is non-tender to palpation. Rigidity (guarding) - Abdomen is soft. Auscultation Auscultation of the abdomen reveals - Bowel sounds normal.  Female Genitourinary Note: Not done, not pertinent to present illness   Musculoskeletal Note: Evaluation of her hips show normal range of motion, no discomfort. Her right knee; no effusion, slight valgus, ranged about 5 to 125, marked crepitus on range of motion with tenderness lateral  greater than medial, no instability. Left knee; no effusion, slight valgus, ranged 5 to 125, moderate crepitus on range of motion, tender lateral greater than medial with no instability noted. Pulses, sensation, motor intact, both lower extremities.  RADIOGRAPHS AP and lateral of the knees are reviewed and she does have lateral compartment bone on bone on each side with patellofemoral bone on bone also.  Assessment & Plan  Primary osteoarthritis of both knees (M17.0)  Note:Surgical Plans: Left Total Knee Replacement  Disposition: Home with HHPT  PCP: Dr. Cathlean Cower - Patient has been seen preoperatively and felt to be stable for surgery.  IV TXA  Anesthesia Issues: None  Patient was instructed on what medications to stop prior to surgery.  Signed electronically by Joelene Millin, III PA-C

## 2016-09-26 ENCOUNTER — Encounter (HOSPITAL_COMMUNITY)
Admission: RE | Disposition: A | Discharge status: Home Health | Payer: Self-pay | Source: Ambulatory Visit | Attending: Orthopedic Surgery

## 2016-09-26 ENCOUNTER — Inpatient Hospital Stay (HOSPITAL_COMMUNITY): Payer: PPO | Admitting: Certified Registered Nurse Anesthetist

## 2016-09-26 ENCOUNTER — Encounter (HOSPITAL_COMMUNITY): Payer: Self-pay

## 2016-09-26 ENCOUNTER — Inpatient Hospital Stay (HOSPITAL_COMMUNITY)
Admission: RE | Admit: 2016-09-26 | Discharge: 2016-09-27 | DRG: 470 | Disposition: A | Discharge status: Home Health | Payer: PPO | Source: Ambulatory Visit | Attending: Orthopedic Surgery | Admitting: Orthopedic Surgery

## 2016-09-26 DIAGNOSIS — H409 Unspecified glaucoma: Secondary | ICD-10-CM | POA: Diagnosis not present

## 2016-09-26 DIAGNOSIS — Z7984 Long term (current) use of oral hypoglycemic drugs: Secondary | ICD-10-CM | POA: Diagnosis not present

## 2016-09-26 DIAGNOSIS — E785 Hyperlipidemia, unspecified: Secondary | ICD-10-CM | POA: Diagnosis present

## 2016-09-26 DIAGNOSIS — E119 Type 2 diabetes mellitus without complications: Secondary | ICD-10-CM | POA: Diagnosis present

## 2016-09-26 DIAGNOSIS — Z79899 Other long term (current) drug therapy: Secondary | ICD-10-CM | POA: Diagnosis not present

## 2016-09-26 DIAGNOSIS — G8918 Other acute postprocedural pain: Secondary | ICD-10-CM | POA: Diagnosis not present

## 2016-09-26 DIAGNOSIS — I10 Essential (primary) hypertension: Secondary | ICD-10-CM | POA: Diagnosis not present

## 2016-09-26 DIAGNOSIS — M1712 Unilateral primary osteoarthritis, left knee: Principal | ICD-10-CM | POA: Diagnosis present

## 2016-09-26 DIAGNOSIS — M25562 Pain in left knee: Secondary | ICD-10-CM | POA: Diagnosis present

## 2016-09-26 DIAGNOSIS — M179 Osteoarthritis of knee, unspecified: Secondary | ICD-10-CM

## 2016-09-26 DIAGNOSIS — M1710 Unilateral primary osteoarthritis, unspecified knee: Secondary | ICD-10-CM | POA: Diagnosis present

## 2016-09-26 HISTORY — PX: TOTAL KNEE ARTHROPLASTY: SHX125

## 2016-09-26 LAB — TYPE AND SCREEN
ABO/RH(D): O POS
ANTIBODY SCREEN: NEGATIVE

## 2016-09-26 LAB — COMPREHENSIVE METABOLIC PANEL
ALK PHOS: 54 U/L (ref 38–126)
ALT: 24 U/L (ref 14–54)
ANION GAP: 8 (ref 5–15)
AST: 38 U/L (ref 15–41)
Albumin: 4.1 g/dL (ref 3.5–5.0)
BILIRUBIN TOTAL: 0.9 mg/dL (ref 0.3–1.2)
BUN: UNDETERMINED mg/dL (ref 6–20)
CALCIUM: 9.2 mg/dL (ref 8.9–10.3)
CO2: 24 mmol/L (ref 22–32)
Chloride: 108 mmol/L (ref 101–111)
Creatinine, Ser: UNDETERMINED mg/dL (ref 0.44–1.00)
Glucose, Bld: 170 mg/dL — ABNORMAL HIGH (ref 65–99)
Potassium: 5.1 mmol/L (ref 3.5–5.1)
SODIUM: 140 mmol/L (ref 135–145)
TOTAL PROTEIN: 7 g/dL (ref 6.5–8.1)

## 2016-09-26 LAB — GLUCOSE, CAPILLARY
GLUCOSE-CAPILLARY: 165 mg/dL — AB (ref 65–99)
GLUCOSE-CAPILLARY: 205 mg/dL — AB (ref 65–99)
GLUCOSE-CAPILLARY: 212 mg/dL — AB (ref 65–99)
Glucose-Capillary: 146 mg/dL — ABNORMAL HIGH (ref 65–99)

## 2016-09-26 LAB — CBC
HCT: 44.6 % (ref 36.0–46.0)
HEMOGLOBIN: 15.5 g/dL — AB (ref 12.0–15.0)
MCH: 32.2 pg (ref 26.0–34.0)
MCHC: 34.8 g/dL (ref 30.0–36.0)
MCV: 92.5 fL (ref 78.0–100.0)
Platelets: 212 10*3/uL (ref 150–400)
RBC: 4.82 MIL/uL (ref 3.87–5.11)
RDW: 12.4 % (ref 11.5–15.5)
WBC: 14.9 10*3/uL — ABNORMAL HIGH (ref 4.0–10.5)

## 2016-09-26 LAB — PROTIME-INR
INR: 0.97
Prothrombin Time: 12.9 seconds (ref 11.4–15.2)

## 2016-09-26 LAB — APTT: aPTT: 26 seconds (ref 24–36)

## 2016-09-26 SURGERY — ARTHROPLASTY, KNEE, TOTAL
Anesthesia: Spinal | Site: Knee | Laterality: Left

## 2016-09-26 MED ORDER — ROPIVACAINE HCL 7.5 MG/ML IJ SOLN
INTRAMUSCULAR | Status: DC | PRN
  Administered 2016-09-26: 20 mL via PERINEURAL

## 2016-09-26 MED ORDER — TRANEXAMIC ACID 1000 MG/10ML IV SOLN
1000.0000 mg | Freq: Once | INTRAVENOUS | Status: AC
  Administered 2016-09-26: 1000 mg via INTRAVENOUS
  Filled 2016-09-26: qty 1100

## 2016-09-26 MED ORDER — PROPOFOL 10 MG/ML IV BOLUS
INTRAVENOUS | Status: AC
  Filled 2016-09-26: qty 20

## 2016-09-26 MED ORDER — SODIUM CHLORIDE 0.9 % IR SOLN
Status: DC | PRN
  Administered 2016-09-26: 1000 mL

## 2016-09-26 MED ORDER — SODIUM CHLORIDE 0.9 % IJ SOLN
INTRAMUSCULAR | Status: AC
  Filled 2016-09-26: qty 50

## 2016-09-26 MED ORDER — DEXAMETHASONE SODIUM PHOSPHATE 10 MG/ML IJ SOLN
10.0000 mg | Freq: Once | INTRAMUSCULAR | Status: AC
  Administered 2016-09-26: 10 mg via INTRAVENOUS

## 2016-09-26 MED ORDER — PHENYLEPHRINE 40 MCG/ML (10ML) SYRINGE FOR IV PUSH (FOR BLOOD PRESSURE SUPPORT)
PREFILLED_SYRINGE | INTRAVENOUS | Status: AC
  Filled 2016-09-26: qty 10

## 2016-09-26 MED ORDER — ACETAMINOPHEN 10 MG/ML IV SOLN
1000.0000 mg | Freq: Once | INTRAVENOUS | Status: AC
  Administered 2016-09-26: 1000 mg via INTRAVENOUS

## 2016-09-26 MED ORDER — EPHEDRINE 5 MG/ML INJ
INTRAVENOUS | Status: AC
  Filled 2016-09-26: qty 10

## 2016-09-26 MED ORDER — LOSARTAN POTASSIUM 50 MG PO TABS
100.0000 mg | ORAL_TABLET | Freq: Every day | ORAL | Status: DC
  Administered 2016-09-27: 100 mg via ORAL
  Filled 2016-09-26: qty 2

## 2016-09-26 MED ORDER — DEXAMETHASONE SODIUM PHOSPHATE 10 MG/ML IJ SOLN
10.0000 mg | Freq: Once | INTRAMUSCULAR | Status: AC
  Administered 2016-09-27: 10 mg via INTRAVENOUS
  Filled 2016-09-26: qty 1

## 2016-09-26 MED ORDER — CEFAZOLIN SODIUM-DEXTROSE 2-4 GM/100ML-% IV SOLN
2.0000 g | Freq: Four times a day (QID) | INTRAVENOUS | Status: AC
  Administered 2016-09-26 (×2): 2 g via INTRAVENOUS
  Filled 2016-09-26 (×3): qty 100

## 2016-09-26 MED ORDER — ACETAMINOPHEN 650 MG RE SUPP: 650.0000 mg | Freq: Four times a day (QID) | RECTAL | Status: DC | PRN

## 2016-09-26 MED ORDER — DEXTROSE 5 % IV SOLN
500.0000 mg | Freq: Four times a day (QID) | INTRAVENOUS | Status: DC | PRN
  Filled 2016-09-26: qty 5

## 2016-09-26 MED ORDER — DEXAMETHASONE SODIUM PHOSPHATE 10 MG/ML IJ SOLN
INTRAMUSCULAR | Status: AC
  Filled 2016-09-26: qty 1

## 2016-09-26 MED ORDER — DOCUSATE SODIUM 100 MG PO CAPS
100.0000 mg | ORAL_CAPSULE | Freq: Two times a day (BID) | ORAL | Status: DC
  Administered 2016-09-26 – 2016-09-27 (×2): 100 mg via ORAL
  Filled 2016-09-26 (×2): qty 1

## 2016-09-26 MED ORDER — ACETAMINOPHEN 500 MG PO TABS
1000.0000 mg | ORAL_TABLET | Freq: Four times a day (QID) | ORAL | Status: AC
  Administered 2016-09-26 – 2016-09-27 (×4): 1000 mg via ORAL
  Filled 2016-09-26 (×4): qty 2

## 2016-09-26 MED ORDER — CHLORHEXIDINE GLUCONATE 4 % EX LIQD: 60.0000 mL | Freq: Once | CUTANEOUS | Status: DC

## 2016-09-26 MED ORDER — FENTANYL CITRATE (PF) 100 MCG/2ML IJ SOLN
50.0000 ug | INTRAMUSCULAR | Status: DC | PRN
  Administered 2016-09-26: 50 ug via INTRAVENOUS

## 2016-09-26 MED ORDER — ACETAMINOPHEN 325 MG PO TABS: 650.0000 mg | ORAL_TABLET | Freq: Four times a day (QID) | ORAL | Status: DC | PRN

## 2016-09-26 MED ORDER — INSULIN ASPART 100 UNIT/ML ~~LOC~~ SOLN
0.0000 [IU] | Freq: Three times a day (TID) | SUBCUTANEOUS | Status: DC
Start: 2016-09-26 — End: 2016-09-27
  Administered 2016-09-26 – 2016-09-27 (×2): 5 [IU] via SUBCUTANEOUS
  Administered 2016-09-27: 2 [IU] via SUBCUTANEOUS
  Filled 2016-09-26: qty 1

## 2016-09-26 MED ORDER — GABAPENTIN 300 MG PO CAPS
300.0000 mg | ORAL_CAPSULE | Freq: Once | ORAL | Status: AC
  Administered 2016-09-26: 300 mg via ORAL

## 2016-09-26 MED ORDER — SODIUM CHLORIDE 0.9 % IJ SOLN
INTRAMUSCULAR | Status: AC
  Filled 2016-09-26: qty 10

## 2016-09-26 MED ORDER — LATANOPROST 0.005 % OP SOLN
1.0000 [drp] | Freq: Every day | OPHTHALMIC | Status: DC
  Administered 2016-09-26: 1 [drp] via OPHTHALMIC
  Filled 2016-09-26: qty 2.5

## 2016-09-26 MED ORDER — POLYETHYLENE GLYCOL 3350 17 G PO PACK: 17.0000 g | PACK | Freq: Every day | ORAL | Status: DC | PRN

## 2016-09-26 MED ORDER — BUPIVACAINE HCL (PF) 0.25 % IJ SOLN
INTRAMUSCULAR | Status: AC
  Filled 2016-09-26: qty 30

## 2016-09-26 MED ORDER — PROPOFOL 10 MG/ML IV BOLUS
INTRAVENOUS | Status: AC
  Filled 2016-09-26: qty 40

## 2016-09-26 MED ORDER — GABAPENTIN 300 MG PO CAPS
ORAL_CAPSULE | ORAL | Status: AC
  Administered 2016-09-26: 300 mg via ORAL
  Filled 2016-09-26: qty 1

## 2016-09-26 MED ORDER — METOCLOPRAMIDE HCL 5 MG PO TABS: 5.0000 mg | ORAL_TABLET | Freq: Three times a day (TID) | ORAL | Status: DC | PRN

## 2016-09-26 MED ORDER — FLEET ENEMA 7-19 GM/118ML RE ENEM: 1.0000 | ENEMA | Freq: Once | RECTAL | Status: DC | PRN

## 2016-09-26 MED ORDER — BUPIVACAINE IN DEXTROSE 0.75-8.25 % IT SOLN
INTRATHECAL | Status: DC | PRN
  Administered 2016-09-26: 1.8 mL via INTRATHECAL

## 2016-09-26 MED ORDER — MIDAZOLAM HCL 2 MG/2ML IJ SOLN
INTRAMUSCULAR | Status: AC
  Administered 2016-09-26: 2 mg via INTRAVENOUS
  Filled 2016-09-26: qty 2

## 2016-09-26 MED ORDER — CEFAZOLIN SODIUM-DEXTROSE 2-4 GM/100ML-% IV SOLN
INTRAVENOUS | Status: AC
  Filled 2016-09-26: qty 100

## 2016-09-26 MED ORDER — MORPHINE SULFATE (PF) 2 MG/ML IV SOLN
1.0000 mg | INTRAVENOUS | Status: DC | PRN
  Administered 2016-09-26: 1 mg via INTRAVENOUS
  Filled 2016-09-26: qty 1

## 2016-09-26 MED ORDER — RIVAROXABAN 10 MG PO TABS
10.0000 mg | ORAL_TABLET | Freq: Every day | ORAL | Status: DC
  Administered 2016-09-27: 10 mg via ORAL
  Filled 2016-09-26: qty 1

## 2016-09-26 MED ORDER — BUPIVACAINE LIPOSOME 1.3 % IJ SUSP
20.0000 mL | Freq: Once | INTRAMUSCULAR | Status: DC
  Filled 2016-09-26: qty 20

## 2016-09-26 MED ORDER — PROPOFOL 500 MG/50ML IV EMUL
INTRAVENOUS | Status: DC | PRN
  Administered 2016-09-26: 100 ug/kg/min via INTRAVENOUS

## 2016-09-26 MED ORDER — SODIUM CHLORIDE 0.9 % IJ SOLN
INTRAMUSCULAR | Status: DC | PRN
  Administered 2016-09-26: 30 mL
  Administered 2016-09-26: 30 mL via INTRAVENOUS

## 2016-09-26 MED ORDER — ONDANSETRON HCL 4 MG/2ML IJ SOLN
INTRAMUSCULAR | Status: AC
  Filled 2016-09-26: qty 2

## 2016-09-26 MED ORDER — LACTATED RINGERS IV SOLN
INTRAVENOUS | Status: DC
  Administered 2016-09-26: 1000 mL via INTRAVENOUS
  Administered 2016-09-26: 13:00:00 via INTRAVENOUS

## 2016-09-26 MED ORDER — PHENYLEPHRINE HCL 10 MG/ML IJ SOLN
INTRAMUSCULAR | Status: DC | PRN
  Administered 2016-09-26 (×4): 80 ug via INTRAVENOUS

## 2016-09-26 MED ORDER — BISACODYL 10 MG RE SUPP
10.0000 mg | Freq: Every day | RECTAL | Status: DC | PRN
Start: 2016-09-26 — End: 2016-09-27

## 2016-09-26 MED ORDER — MENTHOL 3 MG MT LOZG: 1.0000 | LOZENGE | OROMUCOSAL | Status: DC | PRN

## 2016-09-26 MED ORDER — DIPHENHYDRAMINE HCL 12.5 MG/5ML PO ELIX: 12.5000 mg | ORAL_SOLUTION | ORAL | Status: DC | PRN

## 2016-09-26 MED ORDER — FENTANYL CITRATE (PF) 100 MCG/2ML IJ SOLN
INTRAMUSCULAR | Status: AC
  Administered 2016-09-26: 50 ug via INTRAVENOUS
  Filled 2016-09-26: qty 2

## 2016-09-26 MED ORDER — ONDANSETRON HCL 4 MG PO TABS: 4.0000 mg | ORAL_TABLET | Freq: Four times a day (QID) | ORAL | Status: DC | PRN

## 2016-09-26 MED ORDER — 0.9 % SODIUM CHLORIDE (POUR BTL) OPTIME
TOPICAL | Status: DC | PRN
  Administered 2016-09-26: 1000 mL

## 2016-09-26 MED ORDER — ONDANSETRON HCL 4 MG/2ML IJ SOLN: 4.0000 mg | Freq: Four times a day (QID) | INTRAMUSCULAR | Status: DC | PRN

## 2016-09-26 MED ORDER — ACETAMINOPHEN 10 MG/ML IV SOLN
INTRAVENOUS | Status: AC
  Filled 2016-09-26: qty 100

## 2016-09-26 MED ORDER — SODIUM CHLORIDE 0.9 % IV SOLN
INTRAVENOUS | Status: DC
  Administered 2016-09-26: 17:00:00 via INTRAVENOUS

## 2016-09-26 MED ORDER — PHENOL 1.4 % MT LIQD
1.0000 | OROMUCOSAL | Status: DC | PRN
  Filled 2016-09-26: qty 177

## 2016-09-26 MED ORDER — MIDAZOLAM HCL 2 MG/2ML IJ SOLN
1.0000 mg | INTRAMUSCULAR | Status: DC | PRN
  Administered 2016-09-26: 2 mg via INTRAVENOUS

## 2016-09-26 MED ORDER — ONDANSETRON HCL 4 MG/2ML IJ SOLN
INTRAMUSCULAR | Status: DC | PRN
  Administered 2016-09-26: 4 mg via INTRAVENOUS

## 2016-09-26 MED ORDER — CEFAZOLIN SODIUM-DEXTROSE 2-4 GM/100ML-% IV SOLN
2.0000 g | INTRAVENOUS | Status: AC
  Administered 2016-09-26: 2 g via INTRAVENOUS

## 2016-09-26 MED ORDER — METOCLOPRAMIDE HCL 5 MG/ML IJ SOLN: 5.0000 mg | Freq: Three times a day (TID) | INTRAMUSCULAR | Status: DC | PRN

## 2016-09-26 MED ORDER — BUPIVACAINE LIPOSOME 1.3 % IJ SUSP
INTRAMUSCULAR | Status: DC | PRN
  Administered 2016-09-26 (×2): 10 mL

## 2016-09-26 MED ORDER — OXYCODONE HCL 5 MG PO TABS
5.0000 mg | ORAL_TABLET | ORAL | Status: DC | PRN
  Administered 2016-09-26: 5 mg via ORAL
  Administered 2016-09-26 – 2016-09-27 (×4): 10 mg via ORAL
  Filled 2016-09-26 (×4): qty 2
  Filled 2016-09-26: qty 1

## 2016-09-26 MED ORDER — TRANEXAMIC ACID 1000 MG/10ML IV SOLN
1000.0000 mg | INTRAVENOUS | Status: AC
  Administered 2016-09-26: 1000 mg via INTRAVENOUS
  Filled 2016-09-26: qty 1100

## 2016-09-26 MED ORDER — SIMVASTATIN 20 MG PO TABS
40.0000 mg | ORAL_TABLET | Freq: Every day | ORAL | Status: DC
  Administered 2016-09-27: 40 mg via ORAL
  Filled 2016-09-26: qty 2

## 2016-09-26 MED ORDER — METHOCARBAMOL 500 MG PO TABS
500.0000 mg | ORAL_TABLET | Freq: Four times a day (QID) | ORAL | Status: DC | PRN
  Administered 2016-09-26: 500 mg via ORAL
  Filled 2016-09-26: qty 1

## 2016-09-26 MED ORDER — TRAMADOL HCL 50 MG PO TABS: 50.0000 mg | ORAL_TABLET | Freq: Four times a day (QID) | ORAL | Status: DC | PRN

## 2016-09-26 MED ORDER — HYDROMORPHONE HCL-NACL 0.5-0.9 MG/ML-% IV SOSY: 0.2500 mg | PREFILLED_SYRINGE | INTRAVENOUS | Status: DC | PRN

## 2016-09-26 MED ORDER — EPHEDRINE SULFATE 50 MG/ML IJ SOLN
INTRAMUSCULAR | Status: DC | PRN
  Administered 2016-09-26 (×2): 10 mg via INTRAVENOUS

## 2016-09-26 SURGICAL SUPPLY — 50 items
BAG DECANTER FOR FLEXI CONT (MISCELLANEOUS) IMPLANT
BAG ZIPLOCK 12X15 (MISCELLANEOUS) ×2 IMPLANT
BANDAGE ACE 6X5 VEL STRL LF (GAUZE/BANDAGES/DRESSINGS) ×2 IMPLANT
BLADE SAG 18X100X1.27 (BLADE) ×2 IMPLANT
BLADE SAW SGTL 11.0X1.19X90.0M (BLADE) ×2 IMPLANT
BOWL SMART MIX CTS (DISPOSABLE) ×2 IMPLANT
CAPT KNEE TOTAL 3 ATTUNE ×2 IMPLANT
CEMENT HV SMART SET (Cement) ×4 IMPLANT
COVER SURGICAL LIGHT HANDLE (MISCELLANEOUS) ×2 IMPLANT
CUFF TOURN SGL QUICK 34 (TOURNIQUET CUFF) ×1
CUFF TRNQT CYL 34X4X40X1 (TOURNIQUET CUFF) ×1 IMPLANT
DECANTER SPIKE VIAL GLASS SM (MISCELLANEOUS) ×2 IMPLANT
DRAPE U-SHAPE 47X51 STRL (DRAPES) ×2 IMPLANT
DRSG ADAPTIC 3X8 NADH LF (GAUZE/BANDAGES/DRESSINGS) ×2 IMPLANT
DRSG PAD ABDOMINAL 8X10 ST (GAUZE/BANDAGES/DRESSINGS) ×2 IMPLANT
DURAPREP 26ML APPLICATOR (WOUND CARE) ×2 IMPLANT
ELECT REM PT RETURN 15FT ADLT (MISCELLANEOUS) ×2 IMPLANT
EVACUATOR 1/8 PVC DRAIN (DRAIN) ×2 IMPLANT
GAUZE SPONGE 4X4 12PLY STRL (GAUZE/BANDAGES/DRESSINGS) ×2 IMPLANT
GLOVE BIO SURGEON STRL SZ7.5 (GLOVE) ×2 IMPLANT
GLOVE BIO SURGEON STRL SZ8 (GLOVE) ×2 IMPLANT
GLOVE BIOGEL PI IND STRL 6.5 (GLOVE) IMPLANT
GLOVE BIOGEL PI IND STRL 8 (GLOVE) ×2 IMPLANT
GLOVE BIOGEL PI INDICATOR 6.5 (GLOVE)
GLOVE BIOGEL PI INDICATOR 8 (GLOVE) ×2
GLOVE SURG SS PI 6.5 STRL IVOR (GLOVE) IMPLANT
GOWN STRL REUS W/TWL LRG LVL3 (GOWN DISPOSABLE) ×2 IMPLANT
GOWN STRL REUS W/TWL XL LVL3 (GOWN DISPOSABLE) ×2 IMPLANT
HANDPIECE INTERPULSE COAX TIP (DISPOSABLE) ×1
IMMOBILIZER KNEE 20 (SOFTGOODS) ×2
IMMOBILIZER KNEE 20 THIGH 36 (SOFTGOODS) ×1 IMPLANT
MANIFOLD NEPTUNE II (INSTRUMENTS) ×2 IMPLANT
NS IRRIG 1000ML POUR BTL (IV SOLUTION) ×2 IMPLANT
PACK TOTAL KNEE CUSTOM (KITS) ×2 IMPLANT
PADDING CAST COTTON 6X4 STRL (CAST SUPPLIES) ×4 IMPLANT
PENCIL ELECTRO RS 15 CORD (MISCELLANEOUS) ×2 IMPLANT
POSITIONER SURGICAL ARM (MISCELLANEOUS) ×2 IMPLANT
SET HNDPC FAN SPRY TIP SCT (DISPOSABLE) ×1 IMPLANT
STRIP CLOSURE SKIN 1/2X4 (GAUZE/BANDAGES/DRESSINGS) ×4 IMPLANT
SUT MNCRL AB 4-0 PS2 18 (SUTURE) ×2 IMPLANT
SUT STRATAFIX 0 PDS 27 VIOLET (SUTURE) ×2
SUT VIC AB 2-0 CT1 27 (SUTURE) ×3
SUT VIC AB 2-0 CT1 TAPERPNT 27 (SUTURE) ×3 IMPLANT
SUTURE STRATFX 0 PDS 27 VIOLET (SUTURE) ×1 IMPLANT
SYR 30ML LL (SYRINGE) ×4 IMPLANT
TRAY FOLEY CATH 14FRSI W/METER (CATHETERS) ×2 IMPLANT
TRAY FOLEY W/METER SILVER 16FR (SET/KITS/TRAYS/PACK) IMPLANT
WATER STERILE IRR 1000ML POUR (IV SOLUTION) ×4 IMPLANT
WRAP KNEE MAXI GEL POST OP (GAUZE/BANDAGES/DRESSINGS) ×2 IMPLANT
YANKAUER SUCT BULB TIP 10FT TU (MISCELLANEOUS) ×2 IMPLANT

## 2016-09-26 NOTE — Discharge Instructions (Addendum)
° °Dr. Frank Aluisio °Total Joint Specialist °[facility] °[address] °[place], [place] [address] °[phone] ° °TOTAL KNEE REPLACEMENT POSTOPERATIVE DIRECTIONS ° °Knee Rehabilitation, Guidelines Following Surgery  °Results after knee surgery are often greatly improved when you follow the exercise, range of motion and muscle strengthening exercises prescribed by your doctor. Safety measures are also important to protect the knee from further injury. Any time any of these exercises cause you to have increased pain or swelling in your knee joint, decrease the amount until you are comfortable again and slowly increase them. If you have problems or questions, call your caregiver or physical therapist for advice.  ° °HOME CARE INSTRUCTIONS  °Remove items at home which could result in a fall. This includes throw rugs or furniture in walking pathways.  °· ICE to the affected knee every three hours for 30 minutes at a time and then as needed for pain and swelling.  Continue to use ice on the knee for pain and swelling from surgery. You may notice swelling that will progress down to the foot and ankle.  This is normal after surgery.  Elevate the leg when you are not up walking on it.   °· Continue to use the breathing machine which will help keep your temperature down.  It is common for your temperature to cycle up and down following surgery, especially at night when you are not up moving around and exerting yourself.  The breathing machine keeps your lungs expanded and your temperature down. °· Do not place pillow under knee, focus on keeping the knee straight while resting ° °DIET °You may resume your previous home diet once your are discharged from the hospital. ° °DRESSING / WOUND CARE / SHOWERING °You may shower 3 days after surgery, but keep the wounds dry during showering.  You may use an occlusive plastic wrap (Press'n Seal for example), NO SOAKING/SUBMERGING IN THE BATHTUB.  If the  bandage gets wet, change with a clean dry gauze.  If the incision gets wet, pat the wound dry with a clean towel. °You may start showering once you are discharged home but do not submerge the incision under water. Just pat the incision dry and apply a dry gauze dressing on daily. °Change the surgical dressing daily and reapply a dry dressing each time. ° °ACTIVITY °Walk with your walker as instructed. °Use walker as long as suggested by your caregivers. °Avoid periods of inactivity such as sitting longer than an hour when not asleep. This helps prevent blood clots.  °You may resume a sexual relationship in one month or when given the OK by your doctor.  °You may return to work once you are cleared by your doctor.  °Do not drive a car for 6 weeks or until released by you surgeon.  °Do not drive while taking narcotics. ° °WEIGHT BEARING °Weight bearing as tolerated with assist device (walker, cane, etc) as directed, use it as long as suggested by your surgeon or therapist, typically at least 4-6 weeks. ° °POSTOPERATIVE CONSTIPATION PROTOCOL °Constipation - defined medically as fewer than three stools per week and severe constipation as less than one stool per week. ° °One of the most common issues patients have following surgery is constipation.  Even if you have a regular bowel pattern at home, your normal regimen is likely to be disrupted due to multiple reasons following surgery.  Combination of anesthesia, postoperative narcotics, change in appetite and fluid intake all can affect your bowels.    In order to avoid complications following surgery, here are some recommendations in order to help you during your recovery period. ° °Colace (docusate) - Pick up an over-the-counter form of Colace or another stool softener and take twice a day as long as you are requiring postoperative pain medications.  Take with a full glass of water daily.  If you experience loose stools or diarrhea, hold the colace until you stool forms  back up.  If your symptoms do not get better within 1 week or if they get worse, check with your doctor. ° °Dulcolax (bisacodyl) - Pick up over-the-counter and take as directed by the product packaging as needed to assist with the movement of your bowels.  Take with a full glass of water.  Use this product as needed if not relieved by Colace only.  ° °MiraLax (polyethylene glycol) - Pick up over-the-counter to have on hand.  MiraLax is a solution that will increase the amount of water in your bowels to assist with bowel movements.  Take as directed and can mix with a glass of water, juice, soda, coffee, or tea.  Take if you go more than two days without a movement. °Do not use MiraLax more than once per day. Call your doctor if you are still constipated or irregular after using this medication for 7 days in a row. ° °If you continue to have problems with postoperative constipation, please contact the office for further assistance and recommendations.  If you experience "the worst abdominal pain ever" or develop nausea or vomiting, please contact the office immediatly for further recommendations for treatment. ° °ITCHING ° If you experience itching with your medications, try taking only a single pain pill, or even half a pain pill at a time.  You can also use Benadryl over the counter for itching or also to help with sleep.  ° °TED HOSE STOCKINGS °Wear the elastic stockings on both legs for three weeks following surgery during the day but you may remove then at night for sleeping. ° °MEDICATIONS °See your medication summary on the “After Visit Summary” that the nursing staff will review with you prior to discharge.  You may have some home medications which will be placed on hold until you complete the course of blood thinner medication.  It is important for you to complete the blood thinner medication as prescribed by your surgeon.  Continue your approved medications as instructed at time of  discharge. ° °PRECAUTIONS °If you experience chest pain or shortness of breath - call 911 immediately for transfer to the hospital emergency department.  °If you develop a fever greater that 101 F, purulent drainage from wound, increased redness or drainage from wound, foul odor from the wound/dressing, or calf pain - CONTACT YOUR SURGEON.   °                                                °FOLLOW-UP APPOINTMENTS °Make sure you keep all of your appointments after your operation with your surgeon and caregivers. You should call the office at the above phone number and make an appointment for approximately two weeks after the date of your surgery or on the date instructed by your surgeon outlined in the "After Visit Summary". ° ° °RANGE OF MOTION AND STRENGTHENING EXERCISES  °Rehabilitation of the knee is important following a knee injury or   an operation. After just a few days of immobilization, the muscles of the thigh which control the knee become weakened and shrink (atrophy). Knee exercises are designed to build up the tone and strength of the thigh muscles and to improve knee motion. Often times heat used for twenty to thirty minutes before working out will loosen up your tissues and help with improving the range of motion but do not use heat for the first two weeks following surgery. These exercises can be done on a training (exercise) mat, on the floor, on a table or on a bed. Use what ever works the best and is most comfortable for you Knee exercises include:  °Leg Lifts - While your knee is still immobilized in a splint or cast, you can do straight leg raises. Lift the leg to 60 degrees, hold for 3 sec, and slowly lower the leg. Repeat 10-20 times 2-3 times daily. Perform this exercise against resistance later as your knee gets better.  °Quad and Hamstring Sets - Tighten up the muscle on the front of the thigh (Quad) and hold for 5-10 sec. Repeat this 10-20 times hourly. Hamstring sets are done by pushing the  foot backward against an object and holding for 5-10 sec. Repeat as with quad sets.  °· Leg Slides: Lying on your back, slowly slide your foot toward your buttocks, bending your knee up off the floor (only go as far as is comfortable). Then slowly slide your foot back down until your leg is flat on the floor again. °· Angel Wings: Lying on your back spread your legs to the side as far apart as you can without causing discomfort.  °A rehabilitation program following serious knee injuries can speed recovery and prevent re-injury in the future due to weakened muscles. Contact your doctor or a physical therapist for more information on knee rehabilitation.  ° °IF YOU ARE TRANSFERRED TO A SKILLED REHAB FACILITY °If the patient is transferred to a skilled rehab facility following release from the hospital, a list of the current medications will be sent to the facility for the patient to continue.  When discharged from the skilled rehab facility, please have the facility set up the patient's Home Health Physical Therapy prior to being released. Also, the skilled facility will be responsible for providing the patient with their medications at time of release from the facility to include their pain medication, the muscle relaxants, and their blood thinner medication. If the patient is still at the rehab facility at time of the two week follow up appointment, the skilled rehab facility will also need to assist the patient in arranging follow up appointment in our office and any transportation needs. ° °MAKE SURE YOU:  °Understand these instructions.  °Get help right away if you are not doing well or get worse.  ° ° °Pick up stool softner and laxative for home use following surgery while on pain medications. °Do not submerge incision under water. °Please use good hand washing techniques while changing dressing each day. °May shower starting three days after surgery. °Please use a clean towel to pat the incision dry following  showers. °Continue to use ice for pain and swelling after surgery. °Do not use any lotions or creams on the incision until instructed by your surgeon. ° °Take Xarelto for two and a half more weeks following discharge from the hospital, then discontinue Xarelto. °Once the patient has completed the blood thinner regimen, then take a Baby 81 mg Aspirin daily for three   more weeks. ° ° ° °Information on my medicine - XARELTO® (Rivaroxaban) ° °This medication education was reviewed with me or my healthcare representative as part of my discharge preparation.  The pharmacist that spoke with me during my hospital stay was:  ° °Why was Xarelto® prescribed for you? °Xarelto® was prescribed for you to reduce the risk of blood clots forming after orthopedic surgery. The medical term for these abnormal blood clots is venous thromboembolism (VTE). ° °What do you need to know about xarelto® ? °Take your Xarelto® ONCE DAILY at the same time every day. °You may take it either with or without food. ° °If you have difficulty swallowing the tablet whole, you may crush it and mix in applesauce just prior to taking your dose. ° °Take Xarelto® exactly as prescribed by your doctor and DO NOT stop taking Xarelto® without talking to the doctor who prescribed the medication.  Stopping without other VTE prevention medication to take the place of Xarelto® may increase your risk of developing a clot. ° °After discharge, you should have regular check-up appointments with your healthcare provider that is prescribing your Xarelto®.   ° °What do you do if you miss a dose? °If you miss a dose, take it as soon as you remember on the same day then continue your regularly scheduled once daily regimen the next day. Do not take two doses of Xarelto® on the same day.  ° °Important Safety Information °A possible side effect of Xarelto® is bleeding. You should call your healthcare provider right away if you experience any of the following: °? Bleeding from an  injury or your nose that does not stop. °? Unusual colored urine (red or dark brown) or unusual colored stools (red or black). °? Unusual bruising for unknown reasons. °? A serious fall or if you hit your head (even if there is no bleeding). ° °Some medicines may interact with Xarelto® and might increase your risk of bleeding while on Xarelto®. To help avoid this, consult your healthcare provider or pharmacist prior to using any new prescription or non-prescription medications, including herbals, vitamins, non-steroidal anti-inflammatory drugs (NSAIDs) and supplements. ° °This website has more information on Xarelto®: www.xarelto.com. ° ° ° °

## 2016-09-26 NOTE — Anesthesia Postprocedure Evaluation (Signed)
Anesthesia Post Note  Patient: [name]  Procedure(s) Performed: Procedure(s) (LRB): LEFT TOTAL KNEE ARTHROPLASTY (Left)     Patient location during evaluation: PACU Anesthesia Type: Spinal and Regional Level of consciousness: awake and alert Pain management: pain level controlled Vital Signs Assessment: post-procedure vital signs reviewed and stable Respiratory status: spontaneous breathing and respiratory function stable Cardiovascular status: blood pressure returned to baseline and stable Postop Assessment: spinal receding Anesthetic complications: no    Last Vitals:  Vitals:   09/26/16 1415 09/26/16 1430  BP: 112/73 118/78  Pulse: 78 75  Resp: 16 15  Temp:    SpO2: 99% 100%    Last Pain:  Vitals:   09/26/16 1009  TempSrc: Oral                 Longino Trefz,W. EDMOND

## 2016-09-26 NOTE — Interval H&P Note (Signed)
History and Physical Interval Note:  09/26/2016 9:57 AM  [name] [name]  has presented today for surgery, with the diagnosis of Bilateral knee osteoarthritis   The various methods of treatment have been discussed with the patient and family. After consideration of risks, benefits and other options for treatment, the patient has consented to  Procedure(s): LEFT TOTAL KNEE ARTHROPLASTY (Left) as a surgical intervention .  The patient's history has been reviewed, patient examined, no change in status, stable for surgery.  I have reviewed the patient's chart and labs.  Questions were answered to the patient's satisfaction.     Gearlean Alf

## 2016-09-26 NOTE — H&P (View-Only) (Signed)
New orders for LEFT TOTAL KNEE ARTHROPLASTY have been placed into the computer for surgery on 09/26/2016. Arlee Muslim, PA-C

## 2016-09-26 NOTE — Anesthesia Procedure Notes (Signed)
Anesthesia Regional Block: Adductor canal block   Pre-Anesthetic Checklist: ,, timeout performed, Correct Patient, Correct Site, Correct Laterality, Correct Procedure, Correct Position, site marked, Risks and benefits discussed, pre-op evaluation,  At surgeon's request and post-op pain management  Laterality: Left  Prep: Maximum Sterile Barrier Precautions used, chloraprep       Needles:  Injection technique: Single-shot  Needle Type: Echogenic Stimulator Needle     Needle Length: 9cm  Needle Gauge: 21     Additional Needles:   Procedures: ultrasound guided,,,,,,,,  Narrative:  Start time: 09/26/2016 10:51 AM End time: 09/26/2016 11:01 AM Injection made incrementally with aspirations every 5 mL. Anesthesiologist: Roderic Palau  Additional Notes: 2% Lidocaine skin wheel.

## 2016-09-26 NOTE — Op Note (Signed)
OPERATIVE REPORT-TOTAL KNEE ARTHROPLASTY   Pre-operative diagnosis- Osteoarthritis  Left knee(s)  Post-operative diagnosis- Osteoarthritis Left knee(s)  Procedure-  Left  Total Knee Arthroplasty  Surgeon- Dione Plover. Jaycey Gens, MD  Assistant- Nurse assist   Anesthesia-  Adductor canal block and spinal  EBL-* No blood loss amount entered *   Drains Hemovac  Tourniquet time-  Total Tourniquet Time Documented: Thigh (Left) - 41 minutes Total: Thigh (Left) - 41 minutes     Complications- None  Condition-PACU - hemodynamically stable.   Brief Clinical Note  [name] [name] is a 71 y.o. year old female with end stage OA of her left knee with progressively worsening pain and dysfunction. She has constant pain, with activity and at rest and significant functional deficits with difficulties even with ADLs. She has had extensive non-op management including analgesics, injections of cortisone, and home exercise program, but remains in significant pain with significant dysfunction. Radiographs show bone on bone arthritis lateral and patellofemoral with valgus deformity. She presents now for left Total Knee Arthroplasty.    Procedure in detail---   The patient is brought into the operating room and positioned supine on the operating table. After successful administration of  Adductor canal block and spinal,   a tourniquet is placed high on the  Left thigh(s) and the lower extremity is prepped and draped in the usual sterile fashion. Time out is performed by the operating team and then the  Left lower extremity is wrapped in Esmarch, knee flexed and the tourniquet inflated to 300 mmHg.       A midline incision is made with a ten blade through the subcutaneous tissue to the level of the extensor mechanism. A fresh blade is used to make a medial parapatellar arthrotomy. Soft tissue over the proximal medial tibia is subperiosteally elevated to the joint line with a knife and into the semimembranosus  bursa with a Cobb elevator. Soft tissue over the proximal lateral tibia is elevated with attention being paid to avoiding the patellar tendon on the tibial tubercle. The patella is everted, knee flexed 90 degrees and the ACL and PCL are removed. Findings are bone on bone lateral and patellofemoral with large lateral and patellar osteophytes.        The drill is used to create a starting hole in the distal femur and the canal is thoroughly irrigated with sterile saline to remove the fatty contents. The 5 degree Left  valgus alignment guide is placed into the femoral canal and the distal femoral cutting block is pinned to remove 9 mm off the distal femur. Resection is made with an oscillating saw.      The tibia is subluxed forward and the menisci are removed. The extramedullary alignment guide is placed referencing proximally at the medial aspect of the tibial tubercle and distally along the second metatarsal axis and tibial crest. The block is pinned to remove 20mm off the more deficient lateral  side. Resection is made with an oscillating saw. Size 5is the most appropriate size for the tibia and the proximal tibia is prepared with the modular drill and keel punch for that size.      The femoral sizing guide is placed and size 5 is most appropriate. Rotation is marked off the epicondylar axis and confirmed by creating a rectangular flexion gap at 90 degrees. The size 5 cutting block is pinned in this rotation and the anterior, posterior and chamfer cuts are made with the oscillating saw. The intercondylar block is then  placed and that cut is made.      Trial size 5 tibial component, trial size 5 narrow posterior stabilized femur and a 8  mm posterior stabilized rotating platform insert trial is placed. Full extension is achieved with excellent varus/valgus and anterior/posterior balance throughout full range of motion. The patella is everted and thickness measured to be 22  mm. Free hand resection is taken to 12  mm, a 32 template is placed, lug holes are drilled, trial patella is placed, and it tracks normally. Osteophytes are removed off the posterior femur with the trial in place. All trials are removed and the cut bone surfaces prepared with pulsatile lavage. Cement is mixed and once ready for implantation, the size 5 tibial implant, size  5 narrow posterior stabilized femoral component, and the size 32 patella are cemented in place and the patella is held with the clamp. The trial insert is placed and the knee held in full extension. The Exparel (20 ml mixed with 60 ml saline) is injected into the extensor mechanism, posterior capsule, medial and lateral gutters and subcutaneous tissues.  All extruded cement is removed and once the cement is hard the permanent 8 mm posterior stabilized rotating platform insert is placed into the tibial tray.      The wound is copiously irrigated with saline solution and the extensor mechanism closed over a hemovac drain with #1 V-loc suture. The tourniquet is released for a total tourniquet time of 41  minutes. Flexion against gravity is 140 degrees and the patella tracks normally. Subcutaneous tissue is closed with 2.0 vicryl and subcuticular with running 4.0 Monocryl. The incision is cleaned and dried and steri-strips and a bulky sterile dressing are applied. The limb is placed into a knee immobilizer and the patient is awakened and transported to recovery in stable condition.      Please note that a surgical assistant was a medical necessity for this procedure in order to perform it in a safe and expeditious manner. Surgical assistant was necessary to retract the ligaments and vital neurovascular structures to prevent injury to them and also necessary for proper positioning of the limb to allow for anatomic placement of the prosthesis.   Dione Plover Cleburne Savini, MD    09/26/2016, 12:48 PM

## 2016-09-26 NOTE — Progress Notes (Signed)
Assisted Dr. Edmond Fitzgerald with left, ultrasound guided, adductor canal block. Side rails up, monitors on throughout procedure. See vital signs in flow sheet. Tolerated Procedure well. 

## 2016-09-26 NOTE — Anesthesia Procedure Notes (Signed)
Spinal  Patient location during procedure: OR End time: 09/26/2016 11:37 AM Staffing Anesthesiologist: Roderic Palau Resident/CRNA: Maxwell Caul Performed: resident/CRNA  Preanesthetic Checklist Completed: patient identified, site marked, surgical consent, pre-op evaluation, timeout performed, IV checked, risks and benefits discussed and monitors and equipment checked Spinal Block Patient position: sitting Prep: DuraPrep Patient monitoring: heart rate, continuous pulse ox and blood pressure Approach: midline Location: L3-4 Injection technique: single-shot Needle Needle type: Pencan  Needle gauge: 24 G Needle length: 10 cm Additional Notes Expiration date:03-08-2019, LOT # [id number]. Patient sitting for placement of spinal. Patient tolerated well.

## 2016-09-26 NOTE — Transfer of Care (Signed)
Immediate Anesthesia Transfer of Care Note  Patient: [name] [name]  Procedure(s) Performed: Procedure(s) with comments: LEFT TOTAL KNEE ARTHROPLASTY (Left) - with canal block  Patient Location: PACU  Anesthesia Type:Spinal  Level of Consciousness:  sedated, patient cooperative and responds to stimulation  Airway & Oxygen Therapy:Patient Spontanous Breathing and Patient connected to face mask oxgen  Post-op Assessment:  Report given to PACU RN and Post -op Vital signs reviewed and stable  Post vital signs:  Reviewed and stable  Last Vitals:  Vitals:   09/26/16 1109 09/26/16 1110  BP:  113/89  Pulse: 70   Resp: 17   Temp:    SpO2: 15%     Complications: No apparent anesthesia complications

## 2016-09-26 NOTE — Anesthesia Preprocedure Evaluation (Addendum)
Anesthesia Evaluation  Patient identified by MRN, date of birth, ID band Patient awake    Reviewed: Allergy & Precautions, H&P , NPO status , Patient's Chart, lab work & pertinent test results  Airway Mallampati: II  TM Distance: >3 FB Neck ROM: Full    Dental no notable dental hx. (+) Teeth Intact, Dental Advisory Given   Pulmonary neg pulmonary ROS,    Pulmonary exam normal breath sounds clear to auscultation       Cardiovascular hypertension, Pt. on medications  Rhythm:Regular Rate:Normal     Neuro/Psych negative neurological ROS  negative psych ROS   GI/Hepatic negative GI ROS, Neg liver ROS,   Endo/Other  diabetes, Type 2, Oral Hypoglycemic Agents  Renal/GU negative Renal ROS  negative genitourinary   Musculoskeletal  (+) Arthritis , Osteoarthritis,    Abdominal   Peds  Hematology negative hematology ROS (+)   Anesthesia Other Findings   Reproductive/Obstetrics negative OB ROS                            Anesthesia Physical Anesthesia Plan  ASA: II  Anesthesia Plan: Spinal   Post-op Pain Management:  Regional for Post-op pain   Induction: Intravenous  PONV Risk Score and Plan: 3 and Ondansetron, Dexamethasone, Midazolam and Propofol infusion  Airway Management Planned: Simple Face Mask  Additional Equipment:   Intra-op Plan:   Post-operative Plan:   Informed Consent: I have reviewed the patients History and Physical, chart, labs and discussed the procedure including the risks, benefits and alternatives for the proposed anesthesia with the patient or authorized representative who has indicated his/her understanding and acceptance.   Dental advisory given  Plan Discussed with: CRNA  Anesthesia Plan Comments:         Anesthesia Quick Evaluation

## 2016-09-27 DIAGNOSIS — M25562 Pain in left knee: Secondary | ICD-10-CM | POA: Diagnosis not present

## 2016-09-27 DIAGNOSIS — M1712 Unilateral primary osteoarthritis, left knee: Secondary | ICD-10-CM | POA: Diagnosis not present

## 2016-09-27 LAB — BASIC METABOLIC PANEL
ANION GAP: 10 (ref 5–15)
BUN: 19 mg/dL (ref 6–20)
CALCIUM: 9 mg/dL (ref 8.9–10.3)
CO2: 22 mmol/L (ref 22–32)
Chloride: 104 mmol/L (ref 101–111)
Creatinine, Ser: 0.63 mg/dL (ref 0.44–1.00)
GFR calc Af Amer: >60 mL/min (ref >60)
Glucose, Bld: 152 mg/dL — ABNORMAL HIGH (ref 65–99)
POTASSIUM: 4.4 mmol/L (ref 3.5–5.1)
Sodium: 136 mmol/L (ref 135–145)

## 2016-09-27 LAB — CBC
HCT: 41 % (ref 36.0–46.0)
Hemoglobin: 14.4 g/dL (ref 12.0–15.0)
MCH: 31.9 pg (ref 26.0–34.0)
MCHC: 35.1 g/dL (ref 30.0–36.0)
MCV: 90.9 fL (ref 78.0–100.0)
PLATELETS: 231 10*3/uL (ref 150–400)
RBC: 4.51 MIL/uL (ref 3.87–5.11)
RDW: 12.3 % (ref 11.5–15.5)
WBC: 18.4 10*3/uL — AB (ref 4.0–10.5)

## 2016-09-27 LAB — GLUCOSE, CAPILLARY
GLUCOSE-CAPILLARY: 144 mg/dL — AB (ref 65–99)
Glucose-Capillary: 208 mg/dL — ABNORMAL HIGH (ref 65–99)

## 2016-09-27 MED ORDER — TRAMADOL HCL 50 MG PO TABS: 50.0000 mg | ORAL_TABLET | Freq: Four times a day (QID) | ORAL | 0 refills | Status: DC | PRN

## 2016-09-27 MED ORDER — RIVAROXABAN 10 MG PO TABS: 10.0000 mg | ORAL_TABLET | Freq: Every day | ORAL | 0 refills | Status: DC

## 2016-09-27 MED ORDER — OXYCODONE HCL 5 MG PO TABS: 5.0000 mg | ORAL_TABLET | ORAL | 0 refills | Status: DC | PRN

## 2016-09-27 MED ORDER — METHOCARBAMOL 500 MG PO TABS: 500.0000 mg | ORAL_TABLET | Freq: Four times a day (QID) | ORAL | 0 refills | Status: DC | PRN

## 2016-09-27 NOTE — Progress Notes (Signed)
   Subjective: 1 Day Post-Op Procedure(s) (LRB): LEFT TOTAL KNEE ARTHROPLASTY (Left) Patient reports pain as mild.   Patient seen in rounds by Dr. Wynelle Link. Patient is well, but has had some minor complaints of pain in the knee, requiring pain medications We will start therapy today.  If they do well with therapy and meets all goals, then will allow home later this afternoon following therapy. Plan is to go Home after hospital stay.  Objective: Vital signs in last 24 hours: Temp:  [97 F (36.1 C)-98.6 F (37 C)] 98 F (36.7 C) (08/23 4970) Pulse Rate:  [63-96] 82 (08/23 0614) Resp:  [9-21] 17 (08/23 0614) BP: (111-159)/(50-89) 114/77 (08/23 0614) SpO2:  [95 %-100 %] 98 % (08/23 0614) Weight:  [93 kg (205 lb)] 93 kg (205 lb) (08/22 1740)  Intake/Output from previous day:  Intake/Output Summary (Last 24 hours) at 09/27/16 0722 Last data filed at 09/27/16 0615  Gross per 24 hour  Intake             2520 ml  Output             3020 ml  Net             -500 ml    Intake/Output this shift: No intake/output data recorded.  Labs:  Recent Labs  09/26/16 1619  HGB 15.5*    Recent Labs  09/26/16 1619  WBC 14.9*  RBC 4.82  HCT 44.6  PLT 212    Recent Labs  09/26/16 1619  NA 140  K 5.1  CL 108  CO2 24  BUN QUANTITY NOT SUFFICIENT, UNABLE TO PERFORM TEST  CREATININE QUANTITY NOT SUFFICIENT, UNABLE TO PERFORM TEST  GLUCOSE 170*  CALCIUM 9.2    Recent Labs  09/26/16 1619  INR 0.97    EXAM General - Patient is Alert and Appropriate Extremity - Neurovascular intact Sensation intact distally Intact pulses distally Dressing - dressing C/D/I Motor Function - intact, moving foot and toes well on exam.  Hemovac pulled without difficulty.  Past Medical History:  Diagnosis Date  . DIVERTICULOSIS, COLON 09/10/2007  . DM 03/26/2008  . Glaucoma 03/17/2011  . HYPERLIPIDEMIA 09/10/2007  . HYPERTENSION 09/10/2007  . Hypertension   . Osteoarthritis 10/21/2011  . Shingles  03/17/2011    Assessment/Plan: 1 Day Post-Op Procedure(s) (LRB): LEFT TOTAL KNEE ARTHROPLASTY (Left) Principal Problem:   OA (osteoarthritis) of knee  Estimated body mass index is 31.17 kg/m as calculated from the following:   Height as of this encounter: 5\' 8"  (1.727 m).   Weight as of this encounter: 93 kg (205 lb). Advance diet Up with therapy Discharge home with home health if does well and meets goals  DVT Prophylaxis - Xarelto Weight Bearing As Tolerated left Leg Hemovac Pulled Begin Therapy  If meets goals and able to go home: Up with therapy Discharge home with home health depending upon therapy goals Diet - Cardiac diet Follow up - in 2 weeks Activity - WBAT Disposition - Home Condition Upon Discharge - pending therapy D/C Meds - See DC Summary DVT Prophylaxis - Xarelto  Arlee Muslim, PA-C Orthopaedic Surgery 09/27/2016, 7:22 AM

## 2016-09-27 NOTE — Evaluation (Signed)
Physical Therapy Evaluation Patient Details Name: [name] [name] MRN: [id number] DOB: 1945-03-23 Today's Date: 09/27/2016   History of Present Illness  S/P L tka  Clinical Impression  Pt s/p L TKR and presents with decreased L LE strength/ROM and post op pain limiting functional mobility.  Pt should progress to dc home with family assist.    Follow Up Recommendations Home health PT    Equipment Recommendations  None recommended by PT    Recommendations for Other Services OT consult     Precautions / Restrictions Precautions Precautions: Knee;Fall Required Braces or Orthoses: Knee Immobilizer - Left Knee Immobilizer - Left: Discontinue once straight leg raise with < 10 degree lag Restrictions Weight Bearing Restrictions: No Other Position/Activity Restrictions: WBAT      Mobility  Bed Mobility Overal bed mobility: Needs Assistance Bed Mobility: Supine to Sit     Supine to sit: Min assist     General bed mobility comments: cues for sequence and use of R LE to self assist.  Transfers Overall transfer level: Needs assistance Equipment used: Rolling walker (2 wheeled) Transfers: Sit to/from Stand Sit to Stand: Min guard         General transfer comment: for safety.  Cues for UE/LE placement  Ambulation/Gait Ambulation/Gait assistance: Min assist;Min guard Ambulation Distance (Feet): 54 Feet Assistive device: Rolling walker (2 wheeled) Gait Pattern/deviations: Step-to pattern;Decreased step length - right;Decreased step length - left;Shuffle;Trunk flexed Gait velocity: decr Gait velocity interpretation: Below normal speed for age/gender General Gait Details: cues for sequence, posture and position from RW.  Pt ltd by nausea  Stairs            Wheelchair Mobility    Modified Rankin (Stroke Patients Only)       Balance Overall balance assessment: No apparent balance deficits (not formally assessed)                                            Pertinent Vitals/Pain Pain Assessment: 0-10 Pain Score: 3  Pain Location: L knee Pain Descriptors / Indicators: Sore Pain Intervention(s): Limited activity within patient's tolerance;Monitored during session;Premedicated before session;Ice applied    Home Living Family/patient expects to be discharged to:: Private residence Living Arrangements: Spouse/significant other Available Help at Discharge: Family Type of Home: House Home Access: Stairs to enter   Technical brewer of Steps: 1 Home Layout: One level Home Equipment: Bedside commode;Tub bench;Walker - 2 wheels      Prior Function Level of Independence: Independent               Hand Dominance        Extremity/Trunk Assessment   Upper Extremity Assessment Upper Extremity Assessment: Overall WFL for tasks assessed    Lower Extremity Assessment Lower Extremity Assessment: LLE deficits/detail LLE Deficits / Details: AAROM at knee -10- 85 with 2+/5 quads       Communication   Communication: No difficulties  Cognition Arousal/Alertness: Awake/alert Behavior During Therapy: WFL for tasks assessed/performed Overall Cognitive Status: Within Functional Limits for tasks assessed                                        General Comments      Exercises Total Joint Exercises Ankle Circles/Pumps: AROM;Both;Supine;20 reps Quad Sets: AROM;Both;10 reps;Supine Heel Slides: AAROM;Left;15  reps;Supine Straight Leg Raises: AAROM;Left;10 reps;Supine   Assessment/Plan    PT Assessment Patient needs continued PT services  PT Problem List Decreased strength;Decreased range of motion;Decreased activity tolerance;Decreased mobility;Pain;Decreased knowledge of use of DME       PT Treatment Interventions DME instruction;Gait training;Stair training;Functional mobility training;Therapeutic activities;Therapeutic exercise;Patient/family education    PT Goals (Current goals can be found in  the Care Plan section)  Acute Rehab PT Goals Patient Stated Goal: get healed so she can have other one done in January PT Goal Formulation: With patient Time For Goal Achievement: 09/29/16 Potential to Achieve Goals: Good    Frequency 7X/week   Barriers to discharge        Co-evaluation               AM-PAC PT "6 Clicks" Daily Activity  Outcome Measure Difficulty turning over in bed (including adjusting bedclothes, sheets and blankets)?: Unable Difficulty moving from lying on back to sitting on the side of the bed? : A Lot Difficulty sitting down on and standing up from a chair with arms (e.g., wheelchair, bedside commode, etc,.)?: A Lot Help needed moving to and from a bed to chair (including a wheelchair)?: A Little Help needed walking in hospital room?: A Little Help needed climbing 3-5 steps with a railing? : A Little 6 Click Score: 14    End of Session Equipment Utilized During Treatment: Gait belt;Left knee immobilizer Activity Tolerance: Patient tolerated treatment well Patient left: in chair;with call bell/phone within reach Nurse Communication: Mobility status PT Visit Diagnosis: Difficulty in walking, not elsewhere classified (R26.2)    Time: 7169-6789 PT Time Calculation (min) (ACUTE ONLY): 44 min   Charges:   PT Evaluation $PT Eval Low Complexity: 1 Low PT Treatments $Gait Training: 8-22 mins $Therapeutic Exercise: 8-22 mins   PT G Codes:        Pg [phone]   [name] [name] 09/27/2016, 2:39 PM

## 2016-09-27 NOTE — Discharge Summary (Signed)
Physician Discharge Summary   Patient ID: [name] [name] MRN: [id number] DOB/AGE: 73-Jul-1947 73 y.o.  Admit date: 09/26/2016 Discharge date: 09/27/2016  Primary Diagnosis:  Osteoarthritis  Left knee(s)  Admission Diagnoses:  Past Medical History:  Diagnosis Date  . DIVERTICULOSIS, COLON 09/10/2007  . DM 03/26/2008  . Glaucoma 03/17/2011  . HYPERLIPIDEMIA 09/10/2007  . HYPERTENSION 09/10/2007  . Hypertension   . Osteoarthritis 10/21/2011  . Shingles 03/17/2011   Discharge Diagnoses:   Principal Problem:   OA (osteoarthritis) of knee  Estimated body mass index is 31.17 kg/m as calculated from the following:   Height as of this encounter: '5\' 8"'  (1.727 m).   Weight as of this encounter: 93 kg (205 lb).  Procedure:  Procedure(s) (LRB): LEFT TOTAL KNEE ARTHROPLASTY (Left)   Consults: None  HPI: [name] [name] is a 71 y.o. year old female with end stage OA of her left knee with progressively worsening pain and dysfunction. She has constant pain, with activity and at rest and significant functional deficits with difficulties even with ADLs. She has had extensive non-op management including analgesics, injections of cortisone, and home exercise program, but remains in significant pain with significant dysfunction. Radiographs show bone on bone arthritis lateral and patellofemoral with valgus deformity. She presents now for left Total Knee Arthroplasty.    Laboratory Data: Admission on 09/26/2016  Component Date Value Ref Range Status  . aPTT 09/26/2016 26  24 - 36 seconds Final  . WBC 09/26/2016 14.9* 4.0 - 10.5 K/uL Final  . RBC 09/26/2016 4.82  3.87 - 5.11 MIL/uL Final  . Hemoglobin 09/26/2016 15.5* 12.0 - 15.0 g/dL Final  . HCT 09/26/2016 44.6  36.0 - 46.0 % Final  . MCV 09/26/2016 92.5  78.0 - 100.0 fL Final  . MCH 09/26/2016 32.2  26.0 - 34.0 pg Final  . MCHC 09/26/2016 34.8  30.0 - 36.0 g/dL Final  . RDW 09/26/2016 12.4  11.5 - 15.5 % Final  . Platelets 09/26/2016 212  150 - 400  K/uL Final  . Sodium 09/26/2016 140  135 - 145 mmol/L Final  . Potassium 09/26/2016 5.1  3.5 - 5.1 mmol/L Final  . Chloride 09/26/2016 108  101 - 111 mmol/L Final  . CO2 09/26/2016 24  22 - 32 mmol/L Final  . Glucose, Bld 09/26/2016 170* 65 - 99 mg/dL Final  . BUN 09/26/2016 QUANTITY NOT SUFFICIENT, UNABLE TO PERFORM TEST  6 - 20 mg/dL Final  . Creatinine, Ser 09/26/2016 QUANTITY NOT SUFFICIENT, UNABLE TO PERFORM TEST  0.44 - 1.00 mg/dL Final  . Calcium 09/26/2016 9.2  8.9 - 10.3 mg/dL Final  . Total Protein 09/26/2016 7.0  6.5 - 8.1 g/dL Final  . Albumin 09/26/2016 4.1  3.5 - 5.0 g/dL Final  . AST 09/26/2016 38  15 - 41 U/L Final  . ALT 09/26/2016 24  14 - 54 U/L Final  . Alkaline Phosphatase 09/26/2016 54  38 - 126 U/L Final  . Total Bilirubin 09/26/2016 0.9  0.3 - 1.2 mg/dL Final  . GFR calc non Af Amer 09/26/2016 NOT CALCULATED  >60 mL/min Final  . GFR calc Af Amer 09/26/2016 NOT CALCULATED  >60 mL/min Final   Comment: (NOTE) The eGFR has been calculated using the CKD EPI equation. This calculation has not been validated in all clinical situations. eGFR's persistently <60 mL/min signify possible Chronic Kidney Disease.   . Anion gap 09/26/2016 8  5 - 15 Final  . Prothrombin Time 09/26/2016 12.9  11.4 - 15.2  seconds Final  . INR 09/26/2016 0.97   Final  . Glucose-Capillary 09/26/2016 165* 65 - 99 mg/dL Final  . Comment 1 09/26/2016 Notify RN   Final  . Glucose-Capillary 09/26/2016 146* 65 - 99 mg/dL Final  . WBC 09/27/2016 18.4* 4.0 - 10.5 K/uL Final  . RBC 09/27/2016 4.51  3.87 - 5.11 MIL/uL Final  . Hemoglobin 09/27/2016 14.4  12.0 - 15.0 g/dL Final  . HCT 09/27/2016 41.0  36.0 - 46.0 % Final  . MCV 09/27/2016 90.9  78.0 - 100.0 fL Final  . MCH 09/27/2016 31.9  26.0 - 34.0 pg Final  . MCHC 09/27/2016 35.1  30.0 - 36.0 g/dL Final  . RDW 09/27/2016 12.3  11.5 - 15.5 % Final  . Platelets 09/27/2016 231  150 - 400 K/uL Final  . Glucose-Capillary 09/26/2016 205* 65 - 99 mg/dL  Final  . Glucose-Capillary 09/26/2016 212* 65 - 99 mg/dL Final  . Glucose-Capillary 09/27/2016 144* 65 - 99 mg/dL Final  Hospital Outpatient Visit on 09/19/2016  Component Date Value Ref Range Status  . Glucose-Capillary 09/19/2016 140* 65 - 99 mg/dL Final  . Hgb A1c MFr Bld 09/19/2016 6.6* 4.8 - 5.6 % Final   Comment: (NOTE)         Prediabetes: 5.7 - 6.4         Diabetes: >6.4         Glycemic control for adults with diabetes: <7.0   . Mean Plasma Glucose 09/19/2016 143  mg/dL Final   Comment: (NOTE) Performed At: [facility] [place], [place] [id number] Lindon Romp MD [id number]   . aPTT 09/19/2016 27  24 - 36 seconds Final  . WBC 09/19/2016 7.8  4.0 - 10.5 K/uL Final  . RBC 09/19/2016 4.81  3.87 - 5.11 MIL/uL Final  . Hemoglobin 09/19/2016 15.0  12.0 - 15.0 g/dL Final  . HCT 09/19/2016 43.7  36.0 - 46.0 % Final  . MCV 09/19/2016 90.9  78.0 - 100.0 fL Final  . MCH 09/19/2016 31.2  26.0 - 34.0 pg Final  . MCHC 09/19/2016 34.3  30.0 - 36.0 g/dL Final  . RDW 09/19/2016 12.2  11.5 - 15.5 % Final  . Platelets 09/19/2016 243  150 - 400 K/uL Final  . Sodium 09/19/2016 138  135 - 145 mmol/L Final  . Potassium 09/19/2016 4.6  3.5 - 5.1 mmol/L Final  . Chloride 09/19/2016 105  101 - 111 mmol/L Final  . CO2 09/19/2016 25  22 - 32 mmol/L Final  . Glucose, Bld 09/19/2016 145* 65 - 99 mg/dL Final  . BUN 09/19/2016 26* 6 - 20 mg/dL Final  . Creatinine, Ser 09/19/2016 0.83  0.44 - 1.00 mg/dL Final  . Calcium 09/19/2016 9.0  8.9 - 10.3 mg/dL Final  . Total Protein 09/19/2016 7.3  6.5 - 8.1 g/dL Final  . Albumin 09/19/2016 4.3  3.5 - 5.0 g/dL Final  . AST 09/19/2016 26  15 - 41 U/L Final  . ALT 09/19/2016 18  14 - 54 U/L Final  . Alkaline Phosphatase 09/19/2016 60  38 - 126 U/L Final  . Total Bilirubin 09/19/2016 1.1  0.3 - 1.2 mg/dL Final  . GFR calc non Af Amer 09/19/2016 >60  >60 mL/min Final  . GFR calc Af Amer 09/19/2016 >60  >60 mL/min Final    Comment: (NOTE) The eGFR has been calculated using the CKD EPI equation. This calculation has not been validated in all clinical situations. eGFR's persistently <60 mL/min signify  possible Chronic Kidney Disease.   . Anion gap 09/19/2016 8  5 - 15 Final  . Prothrombin Time 09/19/2016 11.9  11.4 - 15.2 seconds Final  . INR 09/19/2016 0.88   Final  . ABO/RH(D) 09/19/2016 O POS   Final  . Antibody Screen 09/19/2016 NEG   Final  . Sample Expiration 09/19/2016 09/29/2016   Final  . Extend sample reason 09/19/2016 NO TRANSFUSIONS OR PREGNANCY IN THE PAST 3 MONTHS   Final  . MRSA, PCR 09/19/2016 NEGATIVE  NEGATIVE Final  . Staphylococcus aureus 09/19/2016 NEGATIVE  NEGATIVE Final   Comment:        The Xpert SA Assay (FDA approved for NASAL specimens in patients over 5 years of age), is one component of a comprehensive surveillance program.  Test performance has been validated by [facility] for patients greater than or equal to 11 year old. It is not intended to diagnose infection nor to guide or monitor treatment.   . ABO/RH(D) 09/19/2016 O POS   Final     X-Rays:No results found.  EKG: Orders placed or performed during the hospital encounter of 09/19/16  . EKG 12 lead  . EKG 12 lead     Hospital Course: BENELLI WINTHER is a 72 y.o. who was admitted to [facility]. They were brought to the operating room on 09/26/2016 and underwent Procedure(s): LEFT TOTAL KNEE ARTHROPLASTY.  Patient tolerated the procedure well and was later transferred to the recovery room and then to the orthopaedic floor for postoperative care.  They were given PO and IV analgesics for pain control following their surgery.  They were given 24 hours of postoperative antibiotics of  Anti-infectives    Start     Dose/Rate Route Frequency Ordered Stop   09/26/16 1800  ceFAZolin (ANCEF) IVPB 2g/100 mL premix     2 g 200 mL/hr over 30 Minutes Intravenous Every 6 hours 09/26/16 1520 09/26/16 2351    09/26/16 1003  ceFAZolin (ANCEF) 2-4 GM/100ML-% IVPB    Comments:  Waldron Session   : cabinet override      09/26/16 1003 09/26/16 1139   09/26/16 0957  ceFAZolin (ANCEF) IVPB 2g/100 mL premix     2 g 200 mL/hr over 30 Minutes Intravenous On call to O.R. 09/26/16 0957 09/26/16 1154     and started on DVT prophylaxis in the form of Xarelto.   PT and OT were ordered for total joint protocol.  Discharge planning consulted to help with postop disposition and equipment needs.  Patient had a good night on the evening of surgery.  They started to get up OOB with therapy on day one. Hemovac drain was pulled without difficulty. Dressing was checked and  was clean and dry.    Patient was seen in rounds on day one by Dr. Wynelle Link and was setup to go home depending upon therapy goals and progress.  Diet: Cardiac diet and Diabetic diet Activity:WBAT Follow-up:in 2 weeks Disposition - Home Discharged Condition: stable   Discharge Instructions    Call MD / Call 911    Complete by:  As directed    If you experience chest pain or shortness of breath, CALL 911 and be transported to the hospital emergency room.  If you develope a fever above 101 F, pus (white drainage) or increased drainage or redness at the wound, or calf pain, call your surgeon's office.   Change dressing    Complete by:  As directed    Change dressing daily  with sterile 4 x 4 inch gauze dressing and apply TED hose. Do not submerge the incision under water.   Constipation Prevention    Complete by:  As directed    Drink plenty of fluids.  Prune juice may be helpful.  You may use a stool softener, such as Colace (over the counter) 100 mg twice a day.  Use MiraLax (over the counter) for constipation as needed.   Diet - low sodium heart healthy    Complete by:  As directed    Diet Carb Modified    Complete by:  As directed    Discharge instructions    Complete by:  As directed    Take Xarelto for two and a half more weeks, then  discontinue Xarelto. Once the patient has completed the blood thinner regimen, then take a Baby 81 mg Aspirin daily for three more weeks.   Pick up stool softner and laxative for home use following surgery while on pain medications. Do not submerge incision under water. Please use good hand washing techniques while changing dressing each day. May shower starting three days after surgery. Please use a clean towel to pat the incision dry following showers. Continue to use ice for pain and swelling after surgery. Do not use any lotions or creams on the incision until instructed by your surgeon.  Wear both TED hose on both legs during the day every day for three weeks, but may remove the TED hose at night at home.  Postoperative Constipation Protocol  Constipation - defined medically as fewer than three stools per week and severe constipation as less than one stool per week.  One of the most common issues patients have following surgery is constipation.  Even if you have a regular bowel pattern at home, your normal regimen is likely to be disrupted due to multiple reasons following surgery.  Combination of anesthesia, postoperative narcotics, change in appetite and fluid intake all can affect your bowels.  In order to avoid complications following surgery, here are some recommendations in order to help you during your recovery period.  Colace (docusate) - Pick up an over-the-counter form of Colace or another stool softener and take twice a day as long as you are requiring postoperative pain medications.  Take with a full glass of water daily.  If you experience loose stools or diarrhea, hold the colace until you stool forms back up.  If your symptoms do not get better within 1 week or if they get worse, check with your doctor.  Dulcolax (bisacodyl) - Pick up over-the-counter and take as directed by the product packaging as needed to assist with the movement of your bowels.  Take with a full glass  of water.  Use this product as needed if not relieved by Colace only.   MiraLax (polyethylene glycol) - Pick up over-the-counter to have on hand.  MiraLax is a solution that will increase the amount of water in your bowels to assist with bowel movements.  Take as directed and can mix with a glass of water, juice, soda, coffee, or tea.  Take if you go more than two days without a movement. Do not use MiraLax more than once per day. Call your doctor if you are still constipated or irregular after using this medication for 7 days in a row.  If you continue to have problems with postoperative constipation, please contact the office for further assistance and recommendations.  If you experience "the worst abdominal pain ever" or develop  nausea or vomiting, please contact the office immediatly for further recommendations for treatment.   Do not put a pillow under the knee. Place it under the heel.    Complete by:  As directed    Do not sit on low chairs, stoools or toilet seats, as it may be difficult to get up from low surfaces    Complete by:  As directed    Driving restrictions    Complete by:  As directed    No driving until released by the physician.   Increase activity slowly as tolerated    Complete by:  As directed    Lifting restrictions    Complete by:  As directed    No lifting until released by the physician.   Patient may shower    Complete by:  As directed    You may shower without a dressing once there is no drainage.  Do not wash over the wound.  If drainage remains, do not shower until drainage stops.   TED hose    Complete by:  As directed    Use stockings (TED hose) for 3 weeks on both leg(s).  You may remove them at night for sleeping.   Weight bearing as tolerated    Complete by:  As directed    Laterality:  left   Extremity:  Lower     Allergies as of 09/27/2016      Reactions   Ivp Dye [iodinated Diagnostic Agents]    Sulfamethoxazole    REACTION: unspecified        Medication List    STOP taking these medications   CALCIUM 600 PO   Vitamin D 2000 units Caps     TAKE these medications   glucose blood test strip Commonly known as:  ONE TOUCH ULTRA TEST Use as instructed three times per day  E11.9   Lancet Device Misc Use as directed three times daily E11.9   losartan 100 MG tablet Commonly known as:  COZAAR TAKE ONE TABLET BY MOUTH DAILY   LUMIGAN 0.01 % Soln Generic drug:  bimatoprost Place 1 drop into both eyes at bedtime.   metFORMIN 500 MG tablet Commonly known as:  GLUCOPHAGE TAKE ONE TABLET BY MOUTH TWO TIMES A DAY WITH A MEAL   methocarbamol 500 MG tablet Commonly known as:  ROBAXIN Take 1 tablet (500 mg total) by mouth every 6 (six) hours as needed for muscle spasms.   ONE TOUCH ULTRA 2 w/Device Kit Use as directed  E11.9   oxyCODONE 5 MG immediate release tablet Commonly known as:  Oxy IR/ROXICODONE Take 1-2 tablets (5-10 mg total) by mouth every 4 (four) hours as needed for moderate pain or severe pain.   rivaroxaban 10 MG Tabs tablet Commonly known as:  XARELTO Take 1 tablet (10 mg total) by mouth daily with breakfast. Take Xarelto for two and a half more weeks following discharge from the hospital, then discontinue Xarelto. Once the patient has completed the blood thinner regimen, then take a Baby 81 mg Aspirin daily for three more weeks.   simvastatin 40 MG tablet Commonly known as:  ZOCOR TAKE ONE TABLET BY MOUTH DAILY   traMADol 50 MG tablet Commonly known as:  ULTRAM Take 1-2 tablets (50-100 mg total) by mouth every 6 (six) hours as needed for moderate pain.            Discharge Care Instructions        Start     Ordered  09/27/16 0000  methocarbamol (ROBAXIN) 500 MG tablet  Every 6 hours PRN    Question:  Supervising Provider  Answer:  Gaynelle Arabian   09/27/16 0731   09/27/16 0000  oxyCODONE (OXY IR/ROXICODONE) 5 MG immediate release tablet  Every 4 hours PRN    Question:  Supervising Provider   Answer:  Gaynelle Arabian   09/27/16 0731   09/27/16 0000  rivaroxaban (XARELTO) 10 MG TABS tablet  Daily with breakfast    Question:  Supervising Provider  Answer:  Gaynelle Arabian   09/27/16 0731   09/27/16 0000  traMADol (ULTRAM) 50 MG tablet  Every 6 hours PRN    Question:  Supervising Provider  Answer:  Gaynelle Arabian   09/27/16 0731   09/27/16 0000  Call MD / Call 911    Comments:  If you experience chest pain or shortness of breath, CALL 911 and be transported to the hospital emergency room.  If you develope a fever above 101 F, pus (white drainage) or increased drainage or redness at the wound, or calf pain, call your surgeon's office.   09/27/16 0731   09/27/16 0000  Discharge instructions    Comments:  Take Xarelto for two and a half more weeks, then discontinue Xarelto. Once the patient has completed the blood thinner regimen, then take a Baby 81 mg Aspirin daily for three more weeks.   Pick up stool softner and laxative for home use following surgery while on pain medications. Do not submerge incision under water. Please use good hand washing techniques while changing dressing each day. May shower starting three days after surgery. Please use a clean towel to pat the incision dry following showers. Continue to use ice for pain and swelling after surgery. Do not use any lotions or creams on the incision until instructed by your surgeon.  Wear both TED hose on both legs during the day every day for three weeks, but may remove the TED hose at night at home.  Postoperative Constipation Protocol  Constipation - defined medically as fewer than three stools per week and severe constipation as less than one stool per week.  One of the most common issues patients have following surgery is constipation.  Even if you have a regular bowel pattern at home, your normal regimen is likely to be disrupted due to multiple reasons following surgery.  Combination of anesthesia, postoperative  narcotics, change in appetite and fluid intake all can affect your bowels.  In order to avoid complications following surgery, here are some recommendations in order to help you during your recovery period.  Colace (docusate) - Pick up an over-the-counter form of Colace or another stool softener and take twice a day as long as you are requiring postoperative pain medications.  Take with a full glass of water daily.  If you experience loose stools or diarrhea, hold the colace until you stool forms back up.  If your symptoms do not get better within 1 week or if they get worse, check with your doctor.  Dulcolax (bisacodyl) - Pick up over-the-counter and take as directed by the product packaging as needed to assist with the movement of your bowels.  Take with a full glass of water.  Use this product as needed if not relieved by Colace only.   MiraLax (polyethylene glycol) - Pick up over-the-counter to have on hand.  MiraLax is a solution that will increase the amount of water in your bowels to assist with bowel movements.  Take as directed  and can mix with a glass of water, juice, soda, coffee, or tea.  Take if you go more than two days without a movement. Do not use MiraLax more than once per day. Call your doctor if you are still constipated or irregular after using this medication for 7 days in a row.  If you continue to have problems with postoperative constipation, please contact the office for further assistance and recommendations.  If you experience "the worst abdominal pain ever" or develop nausea or vomiting, please contact the office immediatly for further recommendations for treatment.   09/27/16 0731   09/27/16 0000  Diet - low sodium heart healthy     09/27/16 0731   09/27/16 0000  Constipation Prevention    Comments:  Drink plenty of fluids.  Prune juice may be helpful.  You may use a stool softener, such as Colace (over the counter) 100 mg twice a day.  Use MiraLax (over the counter) for  constipation as needed.   09/27/16 0731   09/27/16 0000  Diet Carb Modified     09/27/16 0731   09/27/16 0000  Increase activity slowly as tolerated     09/27/16 0731   09/27/16 0000  Patient may shower    Comments:  You may shower without a dressing once there is no drainage.  Do not wash over the wound.  If drainage remains, do not shower until drainage stops.   09/27/16 0731   09/27/16 0000  Weight bearing as tolerated    Question Answer Comment  Laterality left   Extremity Lower      09/27/16 0731   09/27/16 0000  Driving restrictions    Comments:  No driving until released by the physician.   09/27/16 0731   09/27/16 0000  Lifting restrictions    Comments:  No lifting until released by the physician.   09/27/16 0731   09/27/16 0000  TED hose    Comments:  Use stockings (TED hose) for 3 weeks on both leg(s).  You may remove them at night for sleeping.   09/27/16 0731   09/27/16 0000  Change dressing    Comments:  Change dressing daily with sterile 4 x 4 inch gauze dressing and apply TED hose. Do not submerge the incision under water.   09/27/16 0731   09/27/16 0000  Do not put a pillow under the knee. Place it under the heel.     09/27/16 0731   09/27/16 0000  Do not sit on low chairs, stoools or toilet seats, as it may be difficult to get up from low surfaces     09/27/16 0731     Follow-up Information    Gaynelle Arabian, MD. Schedule an appointment as soon as possible for a visit on 10/09/2016.   Specialty:  Orthopedic Surgery Contact information: [address] [place] [address] [phone]           Signed: Arlee Muslim, PA-C Orthopaedic Surgery 09/27/2016, 7:32 AM

## 2016-09-27 NOTE — Progress Notes (Signed)
Discharge planning, spoke with patient and spouse at bedside. Have chosen [facility]. Contacted [facility] for referral. Has [place] and [address]. [phone]

## 2016-09-27 NOTE — Evaluation (Signed)
Occupational Therapy Evaluation Patient Details Name: DIMOND CROTTY MRN: [id number] DOB: [birth year] Today's Date: 09/27/2016    History of Present Illness S/P L tka   Clinical Impression   This 71 year old female was admitted for the above sx. All education was completed. No further OT is needed at this time    Follow Up Recommendations  No OT follow up;Supervision/Assistance - 24 hour    Equipment Recommendations  None recommended by OT    Recommendations for Other Services       Precautions / Restrictions Precautions Precautions: Knee;Fall Restrictions Weight Bearing Restrictions: No      Mobility Bed Mobility               General bed mobility comments: oob  Transfers Overall transfer level: Needs assistance Equipment used: Rolling walker (2 wheeled) Transfers: Sit to/from Stand Sit to Stand: Min guard         General transfer comment: for safety.  Cues for UE/LE placement    Balance                                           ADL either performed or assessed with clinical judgement   ADL Overall ADL's : Needs assistance/impaired     Grooming: Wash/dry hands;Supervision/safety;Standing   Upper Body Bathing: Set up;Sitting   Lower Body Bathing: Minimal assistance;Sit to/from stand   Upper Body Dressing : Set up;Sitting   Lower Body Dressing: Minimal assistance;Sit to/from stand   Toilet Transfer: Min guard;Ambulation;BSC;RW   Toileting- Water quality scientist and Hygiene: Min guard;Sit to/from stand   Tub/ Shower Transfer: Walk-in shower;Min guard;Ambulation     General ADL Comments: practiced shower transfer, then found out pt has a tub bench. She may use tub and bench instead. Husband has had hip sx: she has assisted him before and knows how to use this. Reviewed precautions and general safety     Vision         Perception     Praxis      Pertinent Vitals/Pain Pain Assessment: 0-10 Pain Score: 4  Pain  Location: L knee Pain Descriptors / Indicators: Sore Pain Intervention(s): Limited activity within patient's tolerance;Monitored during session;Premedicated before session;Repositioned;Ice applied     Hand Dominance     Extremity/Trunk Assessment Upper Extremity Assessment Upper Extremity Assessment: Overall WFL for tasks assessed           Communication Communication Communication: No difficulties   Cognition Arousal/Alertness: Awake/alert Behavior During Therapy: WFL for tasks assessed/performed Overall Cognitive Status: Within Functional Limits for tasks assessed                                     General Comments       Exercises     Shoulder Instructions      Home Living Family/patient expects to be discharged to:: Private residence Living Arrangements: Spouse/significant other Available Help at Discharge: Family               Bathroom Shower/Tub: Tub/shower unit;Walk-in shower   Bathroom Toilet: Handicapped height     Home Equipment: Bedside commode;Tub bench          Prior Functioning/Environment Level of Independence: Independent                 OT Problem List:  OT Treatment/Interventions:      OT Goals(Current goals can be found in the care plan section) Acute Rehab OT Goals Patient Stated Goal: get healed so she can have other one done in January OT Goal Formulation: All assessment and education complete, DC therapy  OT Frequency:     Barriers to D/C:            Co-evaluation              AM-PAC PT "6 Clicks" Daily Activity     Outcome Measure Help from another person eating meals?: None Help from another person taking care of personal grooming?: A Little Help from another person toileting, which includes using toliet, bedpan, or urinal?: A Little Help from another person bathing (including washing, rinsing, drying)?: A Little Help from another person to put on and taking off regular upper body  clothing?: A Little Help from another person to put on and taking off regular lower body clothing?: A Little 6 Click Score: 19   End of Session    Activity Tolerance: Patient tolerated treatment well Patient left: in bed;with call bell/phone within reach;with family/visitor present  OT Visit Diagnosis: Pain Pain - Right/Left: Left Pain - part of body: Knee                Time: 2297-9892 OT Time Calculation (min): 20 min Charges:  OT General Charges $OT Visit: 1 Procedure OT Evaluation $OT Eval Low Complexity: 1 Procedure G-Codes:     [place], OTR/L [phone] 09/27/2016  Kavita Bartl 09/27/2016, 12:07 PM

## 2016-09-27 NOTE — Progress Notes (Signed)
Physical Therapy Treatment Patient Details Name: [name] [name] MRN: [id number] DOB: March 10, 1945 Today's Date: 09/27/2016    History of Present Illness S/P L tka    PT Comments    Pt progressing well with mobility and eager for dc home.  Reviewed stairs and don/doff KI with pt and spouse.   Follow Up Recommendations  Home health PT     Equipment Recommendations  None recommended by PT    Recommendations for Other Services OT consult     Precautions / Restrictions Precautions Precautions: Knee;Fall Required Braces or Orthoses: Knee Immobilizer - Left Knee Immobilizer - Left: Discontinue once straight leg raise with < 10 degree lag Restrictions Weight Bearing Restrictions: No Other Position/Activity Restrictions: WBAT    Mobility  Bed Mobility Overal bed mobility: Needs Assistance Bed Mobility: Supine to Sit     Supine to sit: Min assist     General bed mobility comments: cues for sequence and use of R LE to self assist.  Transfers Overall transfer level: Needs assistance Equipment used: Rolling walker (2 wheeled) Transfers: Sit to/from Stand Sit to Stand: Min guard         General transfer comment: for safety.  Cues for UE/LE placement  Ambulation/Gait Ambulation/Gait assistance: Min guard;Supervision Ambulation Distance (Feet): 100 Feet (and 15' to bathroom) Assistive device: Rolling walker (2 wheeled) Gait Pattern/deviations: Step-to pattern;Decreased step length - right;Decreased step length - left;Shuffle;Trunk flexed Gait velocity: decr Gait velocity interpretation: Below normal speed for age/gender General Gait Details: min cues for sequence, posture and position from RW.     Stairs Stairs: Yes   Stair Management: No rails;Step to pattern;Backwards;Forwards;With walker Number of Stairs: 3 General stair comments: single step x 3 - twice fwd and once bkwd.  Cues for sequence and foot/RW placement  Wheelchair Mobility    Modified Rankin  (Stroke Patients Only)       Balance Overall balance assessment: No apparent balance deficits (not formally assessed)                                          Cognition Arousal/Alertness: Awake/alert Behavior During Therapy: WFL for tasks assessed/performed Overall Cognitive Status: Within Functional Limits for tasks assessed                                        Exercises Total Joint Exercises Ankle Circles/Pumps: AROM;Both;Supine;20 reps Quad Sets: AROM;Both;10 reps;Supine Heel Slides: AAROM;Left;15 reps;Supine Straight Leg Raises: AAROM;Left;10 reps;Supine    General Comments        Pertinent Vitals/Pain Pain Assessment: 0-10 Pain Score: 3  Pain Location: L knee Pain Descriptors / Indicators: Sore Pain Intervention(s): Premedicated before session;Monitored during session;Limited activity within patient's tolerance;Ice applied    Home Living Family/patient expects to be discharged to:: Private residence Living Arrangements: Spouse/significant other Available Help at Discharge: Family Type of Home: House Home Access: Stairs to enter   Home Layout: One level Home Equipment: Bedside commode;Tub bench;Walker - 2 wheels      Prior Function Level of Independence: Independent          PT Goals (current goals can now be found in the care plan section) Acute Rehab PT Goals Patient Stated Goal: get healed so she can have other one done in January PT Goal Formulation: With patient Time For  Goal Achievement: 09/29/16 Potential to Achieve Goals: Good Progress towards PT goals: Progressing toward goals    Frequency    7X/week      PT Plan Current plan remains appropriate    Co-evaluation              AM-PAC PT "6 Clicks" Daily Activity  Outcome Measure  Difficulty turning over in bed (including adjusting bedclothes, sheets and blankets)?: Unable Difficulty moving from lying on back to sitting on the side of the bed?  : A Lot Difficulty sitting down on and standing up from a chair with arms (e.g., wheelchair, bedside commode, etc,.)?: A Lot Help needed moving to and from a bed to chair (including a wheelchair)?: A Little Help needed walking in hospital room?: A Little Help needed climbing 3-5 steps with a railing? : A Little 6 Click Score: 14    End of Session Equipment Utilized During Treatment: Gait belt;Left knee immobilizer Activity Tolerance: Patient tolerated treatment well Patient left: in chair;with call bell/phone within reach Nurse Communication: Mobility status PT Visit Diagnosis: Difficulty in walking, not elsewhere classified (R26.2)     Time: 7124-5809 PT Time Calculation (min) (ACUTE ONLY): 28 min  Charges:  $Gait Training: 8-22 mins $Therapeutic Exercise: 8-22 mins $Therapeutic Activity: 8-22 mins                    G Codes:       Pg [phone]    Karsten Howry 09/27/2016, 2:50 PM

## 2016-09-29 DIAGNOSIS — H409 Unspecified glaucoma: Secondary | ICD-10-CM | POA: Diagnosis not present

## 2016-09-29 DIAGNOSIS — Z471 Aftercare following joint replacement surgery: Secondary | ICD-10-CM | POA: Diagnosis not present

## 2016-09-29 DIAGNOSIS — Z87440 Personal history of urinary (tract) infections: Secondary | ICD-10-CM | POA: Diagnosis not present

## 2016-09-29 DIAGNOSIS — Z7901 Long term (current) use of anticoagulants: Secondary | ICD-10-CM | POA: Diagnosis not present

## 2016-09-29 DIAGNOSIS — Z96652 Presence of left artificial knee joint: Secondary | ICD-10-CM | POA: Diagnosis not present

## 2016-09-29 DIAGNOSIS — I10 Essential (primary) hypertension: Secondary | ICD-10-CM | POA: Diagnosis not present

## 2016-09-29 DIAGNOSIS — Z7984 Long term (current) use of oral hypoglycemic drugs: Secondary | ICD-10-CM | POA: Diagnosis not present

## 2016-09-29 DIAGNOSIS — E119 Type 2 diabetes mellitus without complications: Secondary | ICD-10-CM | POA: Diagnosis not present

## 2016-09-29 DIAGNOSIS — H905 Unspecified sensorineural hearing loss: Secondary | ICD-10-CM | POA: Diagnosis not present

## 2016-09-29 DIAGNOSIS — Z79891 Long term (current) use of opiate analgesic: Secondary | ICD-10-CM | POA: Diagnosis not present

## 2016-09-29 DIAGNOSIS — K5730 Diverticulosis of large intestine without perforation or abscess without bleeding: Secondary | ICD-10-CM | POA: Diagnosis not present

## 2016-09-29 DIAGNOSIS — Z9181 History of falling: Secondary | ICD-10-CM | POA: Diagnosis not present

## 2016-10-08 DIAGNOSIS — Z79891 Long term (current) use of opiate analgesic: Secondary | ICD-10-CM | POA: Diagnosis not present

## 2016-10-08 DIAGNOSIS — H905 Unspecified sensorineural hearing loss: Secondary | ICD-10-CM | POA: Diagnosis not present

## 2016-10-08 DIAGNOSIS — Z87440 Personal history of urinary (tract) infections: Secondary | ICD-10-CM | POA: Diagnosis not present

## 2016-10-08 DIAGNOSIS — Z9181 History of falling: Secondary | ICD-10-CM | POA: Diagnosis not present

## 2016-10-08 DIAGNOSIS — Z471 Aftercare following joint replacement surgery: Secondary | ICD-10-CM | POA: Diagnosis not present

## 2016-10-08 DIAGNOSIS — Z96652 Presence of left artificial knee joint: Secondary | ICD-10-CM | POA: Diagnosis not present

## 2016-10-08 DIAGNOSIS — Z7984 Long term (current) use of oral hypoglycemic drugs: Secondary | ICD-10-CM | POA: Diagnosis not present

## 2016-10-08 DIAGNOSIS — Z7901 Long term (current) use of anticoagulants: Secondary | ICD-10-CM | POA: Diagnosis not present

## 2016-10-08 DIAGNOSIS — H409 Unspecified glaucoma: Secondary | ICD-10-CM | POA: Diagnosis not present

## 2016-10-08 DIAGNOSIS — E119 Type 2 diabetes mellitus without complications: Secondary | ICD-10-CM | POA: Diagnosis not present

## 2016-10-08 DIAGNOSIS — I10 Essential (primary) hypertension: Secondary | ICD-10-CM | POA: Diagnosis not present

## 2016-10-08 DIAGNOSIS — K5730 Diverticulosis of large intestine without perforation or abscess without bleeding: Secondary | ICD-10-CM | POA: Diagnosis not present

## 2016-10-09 DIAGNOSIS — M25662 Stiffness of left knee, not elsewhere classified: Secondary | ICD-10-CM | POA: Diagnosis not present

## 2016-10-12 DIAGNOSIS — M25662 Stiffness of left knee, not elsewhere classified: Secondary | ICD-10-CM | POA: Diagnosis not present

## 2016-10-15 DIAGNOSIS — M25662 Stiffness of left knee, not elsewhere classified: Secondary | ICD-10-CM | POA: Diagnosis not present

## 2016-10-18 DIAGNOSIS — M25662 Stiffness of left knee, not elsewhere classified: Secondary | ICD-10-CM | POA: Diagnosis not present

## 2016-10-25 DIAGNOSIS — M25662 Stiffness of left knee, not elsewhere classified: Secondary | ICD-10-CM | POA: Diagnosis not present

## 2016-10-29 DIAGNOSIS — H524 Presbyopia: Secondary | ICD-10-CM | POA: Diagnosis not present

## 2016-10-29 DIAGNOSIS — H40053 Ocular hypertension, bilateral: Secondary | ICD-10-CM | POA: Diagnosis not present

## 2016-10-29 LAB — HM DIABETES EYE EXAM

## 2016-10-30 DIAGNOSIS — Z471 Aftercare following joint replacement surgery: Secondary | ICD-10-CM | POA: Diagnosis not present

## 2016-10-30 DIAGNOSIS — M25662 Stiffness of left knee, not elsewhere classified: Secondary | ICD-10-CM | POA: Diagnosis not present

## 2016-10-30 DIAGNOSIS — Z96652 Presence of left artificial knee joint: Secondary | ICD-10-CM | POA: Diagnosis not present

## 2016-11-01 DIAGNOSIS — M25662 Stiffness of left knee, not elsewhere classified: Secondary | ICD-10-CM | POA: Diagnosis not present

## 2016-11-02 NOTE — Progress Notes (Signed)
   09/27/16 1200  OT G-codes **NOT FOR INPATIENT CLASS**  Functional Assessment Tool Used AM-PAC 6 Clicks Daily Activity;Clinical judgement  Functional Limitation Self care  Self Care Current Status [phone]) CI  Self Care Goal Status (F4239) CI  Self Care Discharge Status [phone]) CI   G-code entered for evaluating OT, Rodman Pickle.   Lou Cal, OT Pager [phone] 11/02/2016

## 2016-11-02 NOTE — Progress Notes (Signed)
   09/27/16 1400  PT G-Codes **NOT FOR INPATIENT CLASS**  Functional Assessment Tool Used AM-PAC 6 Clicks Basic Mobility;Clinical judgement  Functional Limitation Mobility: Walking and moving around  Mobility: Walking and Moving Around Current Status (S1423) CI  Mobility: Walking and Moving Around Goal Status (T5320) CI  g code entered for evaluating PT , Debe Coder based on documentation on assessment.

## 2016-11-06 ENCOUNTER — Other Ambulatory Visit (INDEPENDENT_AMBULATORY_CARE_PROVIDER_SITE_OTHER): Payer: PPO

## 2016-11-06 DIAGNOSIS — Z0001 Encounter for general adult medical examination with abnormal findings: Secondary | ICD-10-CM | POA: Diagnosis not present

## 2016-11-06 DIAGNOSIS — E119 Type 2 diabetes mellitus without complications: Secondary | ICD-10-CM | POA: Diagnosis not present

## 2016-11-06 LAB — BASIC METABOLIC PANEL
BUN: 21 mg/dL (ref 6–23)
CHLORIDE: 105 meq/L (ref 96–112)
CO2: 22 mEq/L (ref 19–32)
CREATININE: 0.69 mg/dL (ref 0.40–1.20)
Calcium: 9.1 mg/dL (ref 8.4–10.5)
GFR: 88.65 mL/min (ref >60.00)
Glucose, Bld: 155 mg/dL — ABNORMAL HIGH (ref 70–99)
POTASSIUM: 4.5 meq/L (ref 3.5–5.1)
SODIUM: 137 meq/L (ref 135–145)

## 2016-11-06 LAB — URINALYSIS, ROUTINE W REFLEX MICROSCOPIC
Bilirubin Urine: NEGATIVE
Hgb urine dipstick: NEGATIVE
Ketones, ur: NEGATIVE
Leukocytes, UA: NEGATIVE
Nitrite: NEGATIVE
PH: 6 (ref 5.0–8.0)
RBC / HPF: NONE SEEN (ref 0–?)
TOTAL PROTEIN, URINE-UPE24: NEGATIVE
Urine Glucose: NEGATIVE
Urobilinogen, UA: 0.2 (ref 0.0–1.0)

## 2016-11-06 LAB — LIPID PANEL
CHOL/HDL RATIO: 3
Cholesterol: 116 mg/dL (ref 0–200)
HDL: 40.6 mg/dL (ref >39.00)
LDL Cholesterol: 42 mg/dL (ref 0–99)
NONHDL: 75.82
Triglycerides: 170 mg/dL — ABNORMAL HIGH (ref 0.0–149.0)
VLDL: 34 mg/dL (ref 0.0–40.0)

## 2016-11-06 LAB — HEPATIC FUNCTION PANEL
ALT: 14 U/L (ref 0–35)
AST: 18 U/L (ref 0–37)
Albumin: 4.1 g/dL (ref 3.5–5.2)
Alkaline Phosphatase: 60 U/L (ref 39–117)
BILIRUBIN DIRECT: 0.2 mg/dL (ref 0.0–0.3)
BILIRUBIN TOTAL: 0.8 mg/dL (ref 0.2–1.2)
Total Protein: 6.5 g/dL (ref 6.0–8.3)

## 2016-11-06 LAB — CBC WITH DIFFERENTIAL/PLATELET
BASOS PCT: 1.1 % (ref 0.0–3.0)
Basophils Absolute: 0.1 10*3/uL (ref 0.0–0.1)
EOS ABS: 0.5 10*3/uL (ref 0.0–0.7)
Eosinophils Relative: 5.4 % — ABNORMAL HIGH (ref 0.0–5.0)
HEMATOCRIT: 43.1 % (ref 36.0–46.0)
Hemoglobin: 14.2 g/dL (ref 12.0–15.0)
LYMPHS PCT: 22.5 % (ref 12.0–46.0)
Lymphs Abs: 2 10*3/uL (ref 0.7–4.0)
MCHC: 33 g/dL (ref 30.0–36.0)
MCV: 92.8 fl (ref 78.0–100.0)
Monocytes Absolute: 0.7 10*3/uL (ref 0.1–1.0)
Monocytes Relative: 7.6 % (ref 3.0–12.0)
NEUTROS ABS: 5.5 10*3/uL (ref 1.4–7.7)
Neutrophils Relative %: 63.4 % (ref 43.0–77.0)
PLATELETS: 310 10*3/uL (ref 150.0–400.0)
RBC: 4.64 Mil/uL (ref 3.87–5.11)
RDW: 13 % (ref 11.5–15.5)
WBC: 8.7 10*3/uL (ref 4.0–10.5)

## 2016-11-06 LAB — MICROALBUMIN / CREATININE URINE RATIO
CREATININE, U: 41.7 mg/dL
MICROALB/CREAT RATIO: 1.7 mg/g (ref 0.0–30.0)

## 2016-11-06 LAB — TSH: TSH: 2.41 u[IU]/mL (ref 0.35–4.50)

## 2016-11-06 LAB — HEMOGLOBIN A1C: Hgb A1c MFr Bld: 6.6 % — ABNORMAL HIGH (ref 4.6–6.5)

## 2016-11-08 ENCOUNTER — Ambulatory Visit (INDEPENDENT_AMBULATORY_CARE_PROVIDER_SITE_OTHER): Payer: PPO | Admitting: Internal Medicine

## 2016-11-08 ENCOUNTER — Encounter: Payer: Self-pay | Admitting: Internal Medicine

## 2016-11-08 VITALS — BP 126/78 | HR 85 | Temp 97.8°F | Ht 68.0 in | Wt 196.0 lb

## 2016-11-08 DIAGNOSIS — I10 Essential (primary) hypertension: Secondary | ICD-10-CM | POA: Diagnosis not present

## 2016-11-08 DIAGNOSIS — E785 Hyperlipidemia, unspecified: Secondary | ICD-10-CM | POA: Diagnosis not present

## 2016-11-08 DIAGNOSIS — Z23 Encounter for immunization: Secondary | ICD-10-CM | POA: Diagnosis not present

## 2016-11-08 DIAGNOSIS — E119 Type 2 diabetes mellitus without complications: Secondary | ICD-10-CM

## 2016-11-08 NOTE — Assessment & Plan Note (Signed)
stable overall by history and exam, recent data reviewed with pt, and pt to continue medical treatment as before,  to f/u any worsening symptoms or concerns Lab Results  Component Value Date   HGBA1C 6.6 (H) 11/06/2016

## 2016-11-08 NOTE — Patient Instructions (Signed)
Good job with the weight loss!  Please continue all other medications as before, and refills have been done if requested.  Please have the pharmacy call with any other refills you may need.  Please continue your efforts at being more active, low cholesterol diet, and weight control.  You are otherwise up to date with prevention measures today.  Please keep your appointments with your specialists as you may have planned  Good luck with your right knee surgury!  Please return in 6 months, or sooner if needed, with Lab testing done 3-5 days before

## 2016-11-08 NOTE — Assessment & Plan Note (Signed)
stable overall by history and exam, recent data reviewed with pt, and pt to continue medical treatment as before,  to f/u any worsening symptoms or concerns BP Readings from Last 3 Encounters:  11/08/16 126/78  09/27/16 138/65  09/19/16 (!) 149/75

## 2016-11-08 NOTE — Progress Notes (Signed)
Subjective:    Patient ID: [name] [name], female    DOB: [birth year], 71 y.o.   MRN: [id number]  HPI  Here to f/u; overall doing ok,  Pt denies chest pain, increasing sob or doe, wheezing, orthopnea, PND, increased LE swelling, palpitations, dizziness or syncope.  Pt denies new neurological symptoms such as new headache, or facial or extremity weakness or numbness.  Pt denies polydipsia, polyuria, or low sugar episode.  Pt states overall good compliance with meds, mostly trying to follow appropriate diet, with wt overall stable,  Now s/p left knee TKR x 6 wks with good results, very low pain, and walks with cane but no giveaways or falls.  Due for right TKR in Feb 2019.  Wt has reduced since the surgury with early satiety but now improving. Wt Readings from Last 3 Encounters:  11/08/16 196 lb (88.9 kg)  09/26/16 205 lb (93 kg)  09/19/16 205 lb 6.4 oz (93.2 kg)   Past Medical History:  Diagnosis Date  . DIVERTICULOSIS, COLON 09/10/2007  . DM 03/26/2008  . Glaucoma 03/17/2011  . HYPERLIPIDEMIA 09/10/2007  . HYPERTENSION 09/10/2007  . Hypertension   . Osteoarthritis 10/21/2011  . Shingles 03/17/2011   Past Surgical History:  Procedure Laterality Date  . ABDOMINAL HYSTERECTOMY    . HEMATOMA EVACUATION     2003; abdominal GI bleed 2nd-ary to Aspirin consumption  . 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 TUBAL LIGATION      reports that she has never smoked. She has never used smokeless tobacco. She reports that she drinks alcohol. She reports that she does not use drugs. family history includes Hypertension in her father and mother. Allergies  Allergen Reactions  . Ivp Dye [Iodinated Diagnostic Agents]   . Sulfamethoxazole     REACTION: unspecified   Current Outpatient Prescriptions on File Prior to Visit  Medication Sig Dispense Refill  .  bimatoprost (LUMIGAN) 0.01 % SOLN Place 1 drop into both eyes at bedtime.     . Blood Glucose Monitoring Suppl (ONE TOUCH ULTRA 2) w/Device KIT Use as directed  E11.9 1 each 0  . glucose blood (ONE TOUCH ULTRA TEST) test strip Use as instructed three times per day  E11.9 300 each 3  . Lancet Device MISC Use as directed three times daily E11.9 300 each 3  . losartan (COZAAR) 100 MG tablet TAKE ONE TABLET BY MOUTH DAILY 90 tablet 3  . metFORMIN (GLUCOPHAGE) 500 MG tablet TAKE ONE TABLET BY MOUTH TWO TIMES A DAY WITH A MEAL 180 tablet 2  . simvastatin (ZOCOR) 40 MG tablet TAKE ONE TABLET BY MOUTH DAILY 90 tablet 3   No current facility-administered medications on file prior to visit.    Review of Systems  Constitutional: Negative for other unusual diaphoresis or sweats HENT: Negative for ear discharge or swelling Eyes: Negative for other worsening visual disturbances Respiratory: Negative for stridor or other swelling  Gastrointestinal: Negative for worsening distension or other blood Genitourinary: Negative for retention or other urinary change Musculoskeletal: Negative for other MSK pain or swelling Skin: Negative for color change or other new lesions Neurological: Negative for worsening tremors and other numbness  Psychiatric/Behavioral: Negative for worsening agitation or other fatigue All other system neg per pt    Objective:   Physical Exam BP 126/78   Pulse 85  Temp 97.8 F (36.6 C) (Oral)   Ht '5\' 8"'  (1.727 m)   Wt 196 lb (88.9 kg)   SpO2 99%   BMI 29.80 kg/m  VS noted,  Constitutional: Pt appears in NAD HENT: Head: NCAT.  Right Ear: External ear normal.  Left Ear: External ear normal.  Eyes: . Pupils are equal, round, and reactive to light. Conjunctivae and EOM are normal Nose: without d/c or deformity Neck: Neck supple. Gross normal ROM Cardiovascular: Normal rate and regular rhythm.   Pulmonary/Chest: Effort normal and breath sounds without rales or wheezing.  Abd:   Soft, NT, ND, + BS, no organomegaly Neurological: Pt is alert. At baseline orientation, motor grossly intact Skin: Skin is warm. No rashes, other new lesions, no LE edema Psychiatric: Pt behavior is normal without agitation  No other exam findings Lab Results  Component Value Date   WBC 8.7 11/06/2016   HGB 14.2 11/06/2016   HCT 43.1 11/06/2016   PLT 310.0 11/06/2016   GLUCOSE 155 (H) 11/06/2016   CHOL 116 11/06/2016   TRIG 170.0 (H) 11/06/2016   HDL 40.60 11/06/2016   LDLDIRECT 65.5 03/24/2012   LDLCALC 42 11/06/2016   ALT 14 11/06/2016   AST 18 11/06/2016   NA 137 11/06/2016   K 4.5 11/06/2016   CL 105 11/06/2016   CREATININE 0.69 11/06/2016   BUN 21 11/06/2016   CO2 22 11/06/2016   TSH 2.41 11/06/2016   INR 0.97 09/26/2016   HGBA1C 6.6 (H) 11/06/2016   MICROALBUR <0.7 11/06/2016     Assessment & Plan:

## 2016-11-08 NOTE — Assessment & Plan Note (Signed)
stable overall by history and exam, recent data reviewed with pt, and pt to continue medical treatment as before,  to f/u any worsening symptoms or concerns Lab Results  Component Value Date   LDLCALC 42 11/06/2016

## 2016-11-11 DIAGNOSIS — L259 Unspecified contact dermatitis, unspecified cause: Secondary | ICD-10-CM | POA: Diagnosis not present

## 2017-01-15 ENCOUNTER — Ambulatory Visit: Payer: Self-pay | Admitting: Orthopedic Surgery

## 2017-02-06 DIAGNOSIS — H40053 Ocular hypertension, bilateral: Secondary | ICD-10-CM | POA: Diagnosis not present

## 2017-03-05 ENCOUNTER — Ambulatory Visit: Payer: Self-pay | Admitting: Orthopedic Surgery

## 2017-03-05 ENCOUNTER — Other Ambulatory Visit (HOSPITAL_COMMUNITY): Payer: Self-pay | Admitting: Emergency Medicine

## 2017-03-05 DIAGNOSIS — Z96652 Presence of left artificial knee joint: Secondary | ICD-10-CM | POA: Insufficient documentation

## 2017-03-05 NOTE — Progress Notes (Signed)
09-19-16 epic

## 2017-03-05 NOTE — H&P (Signed)
Colaizzi, Aryanne (72yo, F) DOB 10/06/1945   Chief Complaint Right knee pain H&P right TKA 03/11/17  Patient's Care Team Primary Care Provider: JAMES W JOHN: [address], [place], [place] [address], Ph [phone], Fax [phone] NPI: [id number] Patient's Pharmacies [id number] ([place]): [address], [place] [address], Ph [phone], Fax [phone]  Vitals 03/05/2017 09:40 am Ht: 5 ft 6.5 in  Wt: 203 lbs  BMI: 32.3 Pain Scale: 2 Pain Scale Type: Numeric BP - 138/82 Pulse - 76  Allergies Reviewed Allergies IODINATED CONTRAST- ORAL AND IV DYE: Edema (Severe)  SULFA (SULFONAMIDE ANTIBIOTICS): Hives (Mild to moderate)   Medications Reviewed Medications losartan 100 mg tablet 12/17/16   filled RXOPTIONS Lumigan 0.01 % eye drops 02/04/17   filled RXOPTIONS metFORMIN 500 mg tablet 12/17/16   filled RXOPTIONS OneTouch Delica Lancets 33 gauge 06/01/16   filled RXOPTIONS OneTouch Ultra Blue Test Strip 06/04/16   filled RXOPTIONS OneTouch Ultra2 kit 06/01/16   filled RXOPTIONS simvastatin 40 mg tablet 12/17/16   filled RXOPTIONS  Problems Reviewed Problems History of arthroplasty of left knee - Onset: 03/05/2017 Osteoarthritis of right knee joint - Onset: 03/05/2017   Family History Reviewed Family History Mother - Heart disease   - Hypertensive disorder Father - Heart disease   - Hypertensive disorder   Social History Reviewed Social History Smoking Status: Never smoker Occupation: Insurance sales agents Chewing tobacco: none Alcohol intake: Moderate Hand Dominance: Right Work related injury?: N Advance directive: N Medical Power of Attorney: N   Surgical History Reviewed Surgical History Tubal ligation Dilation and curettage Knee Joint Replacement - August 2018 Hysterectomy - 02/06/1987   Past Medical History Reviewed Past Medical History Diabetes: Y High Cholesterol: Y Hypertension: Y Osteoarthritis:  Y Notes: Glaucoma, Cataracts   HPI Patient is a 71-year-old female scheduled for a right total knee arthroplasty per Dr. Aluisio on March 11, 2017 at [facility] [facility]. The patient is a 72 year old female who is months out from her LEFT total knee arthroplasty. The patient states that she is doing very well. She said the LEFT knee is doing great at this time. She is not having pain in LEFT knee. She is very pleased with her outcome. Her RIGHT knee is very problematic. She has documented bone-on-bone arthritis lateral patellofemoral. It is confirmed on previous x-rays on that knee that she is bone-on-bone in the lateral and patellofemoral compartments. She is ready to get scheduled for the RIGHT knee replacement. She has bone-on-bone arthritis and has failed injections. We will go ahead and set her up for the RIGHT total knee arthroplasty. Risks and benefits have been discussed with the patient and she elects to proceed with surgery.   ROS Constitutional: Constitutional: no fever, chills, night sweats, or significant weight loss.  Cardiovascular: Cardiovascular: no palpitations or chest pain.  Respiratory: Respiratory: no cough or shortness of breath and No COPD.  Gastrointestinal: Gastrointestinal: no vomiting or nausea.  Musculoskeletal: Musculoskeletal: no swelling in Joints and Joint Pain.  Neurologic: Neurologic: no numbness, tingling, or difficulty with balance.    Physical Exam Patient is a 71-year-old female.  General Mental Status - Alert, cooperative and good historian. General Appearance - pleasant, Not in acute distress. Orientation - Oriented X3. Build & Nutrition - Well nourished and Well developed.  Head and Neck Head - normocephalic, atraumatic . Neck Global Assessment - supple, no bruit auscultated on the right, no bruit auscultated on the left.  Eye Pupil - Bilateral -   PERR Motion - Bilateral - EOMI.  Chest and Lung Exam Auscultation Breath sounds -  clear at anterior chest wall and clear at posterior chest wall. Adventitious sounds - No Adventitious sounds.  Cardiovascular Auscultation Rhythm - Regular rate and rhythm. Heart Sounds - S1 WNL and S2 WNL. Murmurs & Other Heart Sounds - Auscultation of the heart reveals - No Murmurs.  Abdomen Palpation/Percussion Tenderness - Abdomen is non-tender to palpation. Abdomen is soft. Auscultation Auscultation of the abdomen reveals - Bowel sounds normal.  Female Genitourinary Note: Not done, not pertinent to present illness  Musculoskeletal LEFT knee shows no swelling. ROM is 5-125. There is no instability about the knee. RIGHT knee shows valgus deformity. Range of motion is 5-125. There is moderate crepitus on range of motion. She is tender lateral greater than medial with no instability. Gait pattern is antalgic on the RIGHT. Previous x-rays on the right knee shows that she is bone-on-bone in the lateral and patellofemoral compartments.   Assessment / Plan 1. Osteoarthritis of right knee joint M17.11: Unilateral primary osteoarthritis, right knee  Surgical Plans: Right Total Knee Replacement Disposition: Home with HHPT and then Outpatient to start on 2/19 at [facility] PCP: Dr Cathlean Cower IV TXA  Anesthesia Issues: None  Patient was instructed on what medications to stop prior to surgery.  - Follow up visit in 2 weeks with Dr. Wynelle Link - Begin physical therapy following surgery - Pre-operative lab work as pre Pre-Surgical Testing - Prescriptions will be provided in hospital at time of discharge  Return to Office Physical Therapy Evaluation at [facility] on 03/26/2017 at 02:00 PM - Argentina Donovan, PT Gaynelle Arabian, MD for [facility]Op at [facility] on 03/26/2017 at 03:45 PM  Encounter signed-off by Mickel Crow, PA-C

## 2017-03-05 NOTE — H&P (View-Only) (Signed)
[name] [name] (71yo, F) DOB 03/10/1945   Chief Complaint Right knee pain H&P right TKA 03/11/17  Patient's Care Team Primary Care Provider: JAMES W JOHN: [address], [place], [place] [address], Ph [phone], Fax [phone] NPI: [id number] Patient's Pharmacies [id number] ([place]): [address], [place] [address], Ph [phone], Fax [phone]  Vitals 03/05/2017 09:40 am Ht: 5 ft 6.5 in  Wt: 203 lbs  BMI: 32.3 Pain Scale: 2 Pain Scale Type: Numeric BP - 138/82 Pulse - 76  Allergies Reviewed Allergies IODINATED CONTRAST- ORAL AND IV DYE: Edema (Severe)  SULFA (SULFONAMIDE ANTIBIOTICS): Hives (Mild to moderate)   Medications Reviewed Medications losartan 100 mg tablet 12/17/16   filled RXOPTIONS Lumigan 0.01 % eye drops 02/04/17   filled RXOPTIONS metFORMIN 500 mg tablet 12/17/16   filled RXOPTIONS OneTouch Delica Lancets 33 gauge 06/01/16   filled RXOPTIONS OneTouch Ultra Blue Test Strip 06/04/16   filled RXOPTIONS OneTouch Ultra2 kit 06/01/16   filled RXOPTIONS simvastatin 40 mg tablet 12/17/16   filled RXOPTIONS  Problems Reviewed Problems History of arthroplasty of left knee - Onset: 03/05/2017 Osteoarthritis of right knee joint - Onset: 03/05/2017   Family History Reviewed Family History Mother - Heart disease   - Hypertensive disorder Father - Heart disease   - Hypertensive disorder   Social History Reviewed Social History Smoking Status: Never smoker Occupation: Insurance sales agents Chewing tobacco: none Alcohol intake: Moderate Hand Dominance: Right Work related injury?: N Advance directive: N Medical Power of Attorney: N   Surgical History Reviewed Surgical History Tubal ligation Dilation and curettage Knee Joint Replacement - August 2018 Hysterectomy - 02/06/1987   Past Medical History Reviewed Past Medical History Diabetes: Y High Cholesterol: Y Hypertension: Y Osteoarthritis:  Y Notes: Glaucoma, Cataracts   HPI Patient is a 71-year-old female scheduled for a right total knee arthroplasty per Dr. Aluisio on March 11, 2017 at [facility]. The patient is a 71 year old female who is months out from her LEFT total knee arthroplasty. The patient states that she is doing very well. She said the LEFT knee is doing great at this time. She is not having pain in LEFT knee. She is very pleased with her outcome. Her RIGHT knee is very problematic. She has documented bone-on-bone arthritis lateral patellofemoral. It is confirmed on previous x-rays on that knee that she is bone-on-bone in the lateral and patellofemoral compartments. She is ready to get scheduled for the RIGHT knee replacement. She has bone-on-bone arthritis and has failed injections. We will go ahead and set her up for the RIGHT total knee arthroplasty. Risks and benefits have been discussed with the patient and she elects to proceed with surgery.   ROS Constitutional: Constitutional: no fever, chills, night sweats, or significant weight loss.  Cardiovascular: Cardiovascular: no palpitations or chest pain.  Respiratory: Respiratory: no cough or shortness of breath and No COPD.  Gastrointestinal: Gastrointestinal: no vomiting or nausea.  Musculoskeletal: Musculoskeletal: no swelling in Joints and Joint Pain.  Neurologic: Neurologic: no numbness, tingling, or difficulty with balance.    Physical Exam Patient is a 71-year-old female.  General Mental Status - Alert, cooperative and good historian. General Appearance - pleasant, Not in acute distress. Orientation - Oriented X3. Build & Nutrition - Well nourished and Well developed.  Head and Neck Head - normocephalic, atraumatic . Neck Global Assessment - supple, no bruit auscultated on the right, no bruit auscultated on the left.  Eye Pupil - Bilateral -   PERR Motion - Bilateral - EOMI.  Chest and Lung Exam Auscultation Breath sounds -  clear at anterior chest wall and clear at posterior chest wall. Adventitious sounds - No Adventitious sounds.  Cardiovascular Auscultation Rhythm - Regular rate and rhythm. Heart Sounds - S1 WNL and S2 WNL. Murmurs & Other Heart Sounds - Auscultation of the heart reveals - No Murmurs.  Abdomen Palpation/Percussion Tenderness - Abdomen is non-tender to palpation. Abdomen is soft. Auscultation Auscultation of the abdomen reveals - Bowel sounds normal.  Female Genitourinary Note: Not done, not pertinent to present illness  Musculoskeletal LEFT knee shows no swelling. ROM is 5-125. There is no instability about the knee. RIGHT knee shows valgus deformity. Range of motion is 5-125. There is moderate crepitus on range of motion. She is tender lateral greater than medial with no instability. Gait pattern is antalgic on the RIGHT. Previous x-rays on the right knee shows that she is bone-on-bone in the lateral and patellofemoral compartments.   Assessment / Plan 1. Osteoarthritis of right knee joint M17.11: Unilateral primary osteoarthritis, right knee  Surgical Plans: Right Total Knee Replacement Disposition: Home with HHPT and then Outpatient to start on 2/19 at [facility] PCP: Dr Cathlean Cower IV TXA  Anesthesia Issues: None  Patient was instructed on what medications to stop prior to surgery.  - Follow up visit in 2 weeks with Dr. Wynelle Link - Begin physical therapy following surgery - Pre-operative lab work as pre Pre-Surgical Testing - Prescriptions will be provided in hospital at time of discharge  Return to Office Physical Therapy Evaluation at [facility] on 03/26/2017 at 02:00 PM - Argentina Donovan, PT Gaynelle Arabian, MD for Post-Op at [facility] on 03/26/2017 at 03:45 PM  Encounter signed-off by Mickel Crow, PA-C

## 2017-03-05 NOTE — Patient Instructions (Signed)
[name] [name]  03/05/2017   Your procedure is scheduled on: 03-11-17   Report to [facility] Main  Entrance   Follow signs to Short Stay on first floor at 530 AM    Call this number if you have problems the morning of surgery [phone]     Remember: Do not eat food or drink liquids :After Midnight.     Take these medicines the morning of surgery with A SIP OF WATER: simvastatin. Tylenol if needed                                You may not have any metal on your body including hair pins and              piercings  Do not wear jewelry, make-up, lotions, powders or perfumes, deodorant             Do not wear nail polish.  Do not shave  48 hours prior to surgery.         Do not bring valuables to the hospital. [place].  Contacts, dentures or bridgework may not be worn into surgery.  Leave suitcase in the car. After surgery it may be brought to your room.                Please read over the following fact sheets you were given: _____________________________________________________________________             How to Manage Your Diabetes Before and After Surgery  Why is it important to control my blood sugar before and after surgery? . Improving blood sugar levels before and after surgery helps healing and can limit problems. . A way of improving blood sugar control is eating a healthy diet by: o  Eating less sugar and carbohydrates o  Increasing activity/exercise o  Talking with your doctor about reaching your blood sugar goals . High blood sugars (greater than 180 mg/dL) can raise your risk of infections and slow your recovery, so you will need to focus on controlling your diabetes during the weeks before surgery. . Make sure that the doctor who takes care of your diabetes knows about your planned surgery including the date and location.  How do I manage my blood sugar before surgery? . Check your  blood sugar at least 4 times a day, starting 2 days before surgery, to make sure that the level is not too high or low. o Check your blood sugar the morning of your surgery when you wake up and every 2 hours until you get to the Short Stay unit. . If your blood sugar is less than 70 mg/dL, you will need to treat for low blood sugar: o Do not take insulin. o Treat a low blood sugar (less than 70 mg/dL) with  cup of clear juice (cranberry or apple), 4 glucose tablets, OR glucose gel. o Recheck blood sugar in 15 minutes after treatment (to make sure it is greater than 70 mg/dL). If your blood sugar is not greater than 70 mg/dL on recheck, call [phone] for further instructions. . Report your blood sugar to the short stay nurse when you get to Short Stay.  . If you are admitted to the hospital after  surgery: o Your blood sugar will be checked by the staff and you will probably be given insulin after surgery (instead of oral diabetes medicines) to make sure you have good blood sugar levels. o The goal for blood sugar control after surgery is 80-180 mg/dL.   WHAT DO I DO ABOUT MY DIABETES MEDICATION?   . THE DAY BEFORE SURGERY, take METFORMIN as normal      . THE MORNING OF SURGERY, Do not take oral diabetes medicines (pills)   Patient Signature:  Date:   Nurse Signature:  Date:   Reviewed and Endorsed by [facility] Patient Education Committee, August 2015   [facility] - Preparing for Surgery Before surgery, you can play an important role.  Because skin is not sterile, your skin needs to be as free of germs as possible.  You can reduce the number of germs on your skin by washing with CHG (chlorahexidine gluconate) soap before surgery.  CHG is an antiseptic cleaner which kills germs and bonds with the skin to continue killing germs even after washing. Please DO NOT use if you have an allergy to CHG or antibacterial soaps.  If your skin becomes reddened/irritated stop using the CHG and  inform your nurse when you arrive at Short Stay. Do not shave (including legs and underarms) for at least 48 hours prior to the first CHG shower.  You may shave your face/neck. Please follow these instructions carefully:  1.  Shower with CHG Soap the night before surgery and the  morning of Surgery.  2.  If you choose to wash your hair, wash your hair first as usual with your  normal  shampoo.  3.  After you shampoo, rinse your hair and body thoroughly to remove the  shampoo.                           4.  Use CHG as you would any other liquid soap.  You can apply chg directly  to the skin and wash                       Gently with a scrungie or clean washcloth.  5.  Apply the CHG Soap to your body ONLY FROM THE NECK DOWN.   Do not use on face/ open                           Wound or open sores. Avoid contact with eyes, ears mouth and genitals (private parts).                       Wash face,  Genitals (private parts) with your normal soap.             6.  Wash thoroughly, paying special attention to the area where your surgery  will be performed.  7.  Thoroughly rinse your body with warm water from the neck down.  8.  DO NOT shower/wash with your normal soap after using and rinsing off  the CHG Soap.                9.  Pat yourself dry with a clean towel.            10.  Wear clean pajamas.            11.  Place clean sheets on your bed the night of  your first shower and do not  sleep with pets. Day of Surgery : Do not apply any lotions/deodorants the morning of surgery.  Please wear clean clothes to the hospital/surgery center.  FAILURE TO FOLLOW THESE INSTRUCTIONS MAY RESULT IN THE CANCELLATION OF YOUR SURGERY PATIENT SIGNATURE_________________________________  NURSE SIGNATURE__________________________________  ________________________________________________________________________   Adam Phenix  An incentive spirometer is a tool that can help keep your lungs clear and  active. This tool measures how well you are filling your lungs with each breath. Taking long deep breaths may help reverse or decrease the chance of developing breathing (pulmonary) problems (especially infection) following:  A long period of time when you are unable to move or be active. BEFORE THE PROCEDURE   If the spirometer includes an indicator to show your best effort, your nurse or respiratory therapist will set it to a desired goal.  If possible, sit up straight or lean slightly forward. Try not to slouch.  Hold the incentive spirometer in an upright position. INSTRUCTIONS FOR USE  1. Sit on the edge of your bed if possible, or sit up as far as you can in bed or on a chair. 2. Hold the incentive spirometer in an upright position. 3. Breathe out normally. 4. Place the mouthpiece in your mouth and seal your lips tightly around it. 5. Breathe in slowly and as deeply as possible, raising the piston or the ball toward the top of the column. 6. Hold your breath for 3-5 seconds or for as long as possible. Allow the piston or ball to fall to the bottom of the column. 7. Remove the mouthpiece from your mouth and breathe out normally. 8. Rest for a few seconds and repeat Steps 1 through 7 at least 10 times every 1-2 hours when you are awake. Take your time and take a few normal breaths between deep breaths. 9. The spirometer may include an indicator to show your best effort. Use the indicator as a goal to work toward during each repetition. 10. After each set of 10 deep breaths, practice coughing to be sure your lungs are clear. If you have an incision (the cut made at the time of surgery), support your incision when coughing by placing a pillow or rolled up towels firmly against it. Once you are able to get out of bed, walk around indoors and cough well. You may stop using the incentive spirometer when instructed by your caregiver.  RISKS AND COMPLICATIONS  Take your time so you do not get  dizzy or light-headed.  If you are in pain, you may need to take or ask for pain medication before doing incentive spirometry. It is harder to take a deep breath if you are having pain. AFTER USE  Rest and breathe slowly and easily.  It can be helpful to keep track of a log of your progress. Your caregiver can provide you with a simple table to help with this. If you are using the spirometer at home, follow these instructions: [place] IF:   You are having difficultly using the spirometer.  You have trouble using the spirometer as often as instructed.  Your pain medication is not giving enough relief while using the spirometer.  You develop fever of 100.5 F (38.1 C) or higher. SEEK IMMEDIATE MEDICAL CARE IF:   You cough up bloody sputum that had not been present before.  You develop fever of 102 F (38.9 C) or greater.  You develop worsening pain at or near the  incision site. MAKE SURE YOU:   Understand these instructions.  Will watch your condition.  Will get help right away if you are not doing well or get worse. Document Released: 06/04/2006 Document Revised: 04/16/2011 Document Reviewed: 08/05/2006 ExitCare Patient Information 2014 ExitCare, [place].   ________________________________________________________________________  WHAT IS A BLOOD TRANSFUSION? Blood Transfusion Information  A transfusion is the replacement of blood or some of its parts. Blood is made up of multiple cells which provide different functions.  Red blood cells carry oxygen and are used for blood loss replacement.  White blood cells fight against infection.  Platelets control bleeding.  Plasma helps clot blood.  Other blood products are available for specialized needs, such as hemophilia or other clotting disorders. BEFORE THE TRANSFUSION  Who gives blood for transfusions?   Healthy volunteers who are fully evaluated to make sure their blood is safe. This is blood bank  blood. Transfusion therapy is the safest it has ever been in the practice of medicine. Before blood is taken from a donor, a complete history is taken to make sure that person has no history of diseases nor engages in risky social behavior (examples are intravenous drug use or sexual activity with multiple partners). The donor's travel history is screened to minimize risk of transmitting infections, such as malaria. The donated blood is tested for signs of infectious diseases, such as HIV and hepatitis. The blood is then tested to be sure it is compatible with you in order to minimize the chance of a transfusion reaction. If you or a relative donates blood, this is often done in anticipation of surgery and is not appropriate for emergency situations. It takes many days to process the donated blood. RISKS AND COMPLICATIONS Although transfusion therapy is very safe and saves many lives, the main dangers of transfusion include:   Getting an infectious disease.  Developing a transfusion reaction. This is an allergic reaction to something in the blood you were given. Every precaution is taken to prevent this. The decision to have a blood transfusion has been considered carefully by your caregiver before blood is given. Blood is not given unless the benefits outweigh the risks. AFTER THE TRANSFUSION  Right after receiving a blood transfusion, you will usually feel much better and more energetic. This is especially true if your red blood cells have gotten low (anemic). The transfusion raises the level of the red blood cells which carry oxygen, and this usually causes an energy increase.  The nurse administering the transfusion will monitor you carefully for complications. HOME CARE INSTRUCTIONS  No special instructions are needed after a transfusion. You may find your energy is better. Speak with your caregiver about any limitations on activity for underlying diseases you may have. SEEK MEDICAL CARE IF:    Your condition is not improving after your transfusion.  You develop redness or irritation at the intravenous (IV) site. SEEK IMMEDIATE MEDICAL CARE IF:  Any of the following symptoms occur over the next 12 hours:  Shaking chills.  You have a temperature by mouth above 102 F (38.9 C), not controlled by medicine.  Chest, back, or muscle pain.  People around you feel you are not acting correctly or are confused.  Shortness of breath or difficulty breathing.  Dizziness and fainting.  You get a rash or develop hives.  You have a decrease in urine output.  Your urine turns a dark color or changes to pink, red, or brown. Any of the following symptoms occur over the next  10 days:  You have a temperature by mouth above 102 F (38.9 C), not controlled by medicine.  Shortness of breath.  Weakness after normal activity.  The white part of the eye turns yellow (jaundice).  You have a decrease in the amount of urine or are urinating less often.  Your urine turns a dark color or changes to pink, red, or brown. Document Released: 01/20/2000 Document Revised: 04/16/2011 Document Reviewed: 09/08/2007 [facility] Patient Information 2014 [place], [place].  _______________________________________________________________________

## 2017-03-06 ENCOUNTER — Encounter (HOSPITAL_COMMUNITY)
Admission: RE | Admit: 2017-03-06 | Discharge: 2017-03-06 | Disposition: A | Discharge status: Home / Self Care | Payer: PPO | Source: Ambulatory Visit | Attending: Orthopedic Surgery | Admitting: Orthopedic Surgery

## 2017-03-06 ENCOUNTER — Encounter (HOSPITAL_COMMUNITY): Payer: Self-pay

## 2017-03-06 ENCOUNTER — Other Ambulatory Visit: Payer: Self-pay

## 2017-03-06 DIAGNOSIS — Z01812 Encounter for preprocedural laboratory examination: Secondary | ICD-10-CM | POA: Diagnosis not present

## 2017-03-06 DIAGNOSIS — M1711 Unilateral primary osteoarthritis, right knee: Secondary | ICD-10-CM | POA: Diagnosis not present

## 2017-03-06 LAB — SURGICAL PCR SCREEN
MRSA, PCR: NEGATIVE
Staphylococcus aureus: NEGATIVE

## 2017-03-06 LAB — COMPREHENSIVE METABOLIC PANEL
ALT: 20 U/L (ref 14–54)
AST: 28 U/L (ref 15–41)
Albumin: 4.6 g/dL (ref 3.5–5.0)
Alkaline Phosphatase: 62 U/L (ref 38–126)
Anion gap: 8 (ref 5–15)
BUN: 24 mg/dL — ABNORMAL HIGH (ref 6–20)
CHLORIDE: 107 mmol/L (ref 101–111)
CO2: 24 mmol/L (ref 22–32)
Calcium: 9.3 mg/dL (ref 8.9–10.3)
Creatinine, Ser: 0.75 mg/dL (ref 0.44–1.00)
Glucose, Bld: 161 mg/dL — ABNORMAL HIGH (ref 65–99)
POTASSIUM: 4.5 mmol/L (ref 3.5–5.1)
SODIUM: 139 mmol/L (ref 135–145)
Total Bilirubin: 1.3 mg/dL — ABNORMAL HIGH (ref 0.3–1.2)
Total Protein: 7.3 g/dL (ref 6.5–8.1)

## 2017-03-06 LAB — CBC
HCT: 44.7 % (ref 36.0–46.0)
Hemoglobin: 14.9 g/dL (ref 12.0–15.0)
MCH: 30.7 pg (ref 26.0–34.0)
MCHC: 33.3 g/dL (ref 30.0–36.0)
MCV: 92.2 fL (ref 78.0–100.0)
PLATELETS: 266 10*3/uL (ref 150–400)
RBC: 4.85 MIL/uL (ref 3.87–5.11)
RDW: 12.7 % (ref 11.5–15.5)
WBC: 7.6 10*3/uL (ref 4.0–10.5)

## 2017-03-06 LAB — GLUCOSE, CAPILLARY: GLUCOSE-CAPILLARY: 169 mg/dL — AB (ref 65–99)

## 2017-03-06 LAB — PROTIME-INR
INR: 0.93
PROTHROMBIN TIME: 12.3 s (ref 11.4–15.2)

## 2017-03-06 LAB — APTT: aPTT: 25 seconds (ref 24–36)

## 2017-03-06 LAB — HEMOGLOBIN A1C
HEMOGLOBIN A1C: 6.9 % — AB (ref 4.8–5.6)
MEAN PLASMA GLUCOSE: 151.33 mg/dL

## 2017-03-10 ENCOUNTER — Encounter (HOSPITAL_COMMUNITY): Payer: Self-pay | Admitting: Certified Registered Nurse Anesthetist

## 2017-03-10 MED ORDER — SODIUM CHLORIDE 0.9 % IV SOLN
1000.0000 mg | INTRAVENOUS | Status: AC
  Administered 2017-03-11: 1000 mg via INTRAVENOUS
  Filled 2017-03-10: qty 1100

## 2017-03-10 MED ORDER — BUPIVACAINE LIPOSOME 1.3 % IJ SUSP
20.0000 mL | Freq: Once | INTRAMUSCULAR | Status: DC
  Filled 2017-03-10: qty 20

## 2017-03-10 NOTE — Anesthesia Preprocedure Evaluation (Signed)
Anesthesia Evaluation  Patient identified by MRN, date of birth, ID band Patient awake    Reviewed: Allergy & Precautions, H&P , NPO status , Patient's Chart, lab work & pertinent test results  Airway Mallampati: II  TM Distance: >3 FB Neck ROM: Full    Dental no notable dental hx. (+) Teeth Intact, Dental Advisory Given   Pulmonary neg pulmonary ROS,    Pulmonary exam normal breath sounds clear to auscultation       Cardiovascular hypertension, Pt. on medications  Rhythm:Regular Rate:Normal     Neuro/Psych negative neurological ROS  negative psych ROS   GI/Hepatic negative GI ROS, Neg liver ROS,   Endo/Other  diabetes, Type 2, Oral Hypoglycemic Agents  Renal/GU negative Renal ROS  negative genitourinary   Musculoskeletal  (+) Arthritis , Osteoarthritis,    Abdominal   Peds  Hematology negative hematology ROS (+)   Anesthesia Other Findings   Reproductive/Obstetrics negative OB ROS                             Anesthesia Physical  Anesthesia Plan  ASA: II  Anesthesia Plan: Spinal   Post-op Pain Management:  Regional for Post-op pain   Induction: Intravenous  PONV Risk Score and Plan: 3 and Ondansetron, Dexamethasone, Propofol infusion and Treatment may vary due to age or medical condition  Airway Management Planned: Simple Face Mask  Additional Equipment:   Intra-op Plan:   Post-operative Plan:   Informed Consent: I have reviewed the patients History and Physical, chart, labs and discussed the procedure including the risks, benefits and alternatives for the proposed anesthesia with the patient or authorized representative who has indicated his/her understanding and acceptance.   Dental advisory given  Plan Discussed with: CRNA  Anesthesia Plan Comments:         Anesthesia Quick Evaluation

## 2017-03-11 ENCOUNTER — Other Ambulatory Visit: Payer: Self-pay

## 2017-03-11 ENCOUNTER — Encounter (HOSPITAL_COMMUNITY)
Admission: RE | Disposition: A | Discharge status: Home / Self Care | Payer: Self-pay | Source: Ambulatory Visit | Attending: Orthopedic Surgery

## 2017-03-11 ENCOUNTER — Inpatient Hospital Stay (HOSPITAL_COMMUNITY): Payer: PPO | Admitting: Certified Registered Nurse Anesthetist

## 2017-03-11 ENCOUNTER — Ambulatory Visit (HOSPITAL_COMMUNITY)
Admission: RE | Admit: 2017-03-11 | Discharge: 2017-03-12 | Disposition: A | Discharge status: Home / Self Care | Payer: PPO | Source: Ambulatory Visit | Attending: Orthopedic Surgery | Admitting: Orthopedic Surgery

## 2017-03-11 ENCOUNTER — Encounter (HOSPITAL_COMMUNITY): Payer: Self-pay | Admitting: *Deleted

## 2017-03-11 DIAGNOSIS — Z79899 Other long term (current) drug therapy: Secondary | ICD-10-CM | POA: Diagnosis not present

## 2017-03-11 DIAGNOSIS — Z7984 Long term (current) use of oral hypoglycemic drugs: Secondary | ICD-10-CM | POA: Diagnosis not present

## 2017-03-11 DIAGNOSIS — E119 Type 2 diabetes mellitus without complications: Secondary | ICD-10-CM | POA: Insufficient documentation

## 2017-03-11 DIAGNOSIS — M1711 Unilateral primary osteoarthritis, right knee: Secondary | ICD-10-CM | POA: Diagnosis not present

## 2017-03-11 DIAGNOSIS — E7800 Pure hypercholesterolemia, unspecified: Secondary | ICD-10-CM | POA: Insufficient documentation

## 2017-03-11 DIAGNOSIS — G8918 Other acute postprocedural pain: Secondary | ICD-10-CM | POA: Diagnosis not present

## 2017-03-11 DIAGNOSIS — I10 Essential (primary) hypertension: Secondary | ICD-10-CM | POA: Insufficient documentation

## 2017-03-11 DIAGNOSIS — M1710 Unilateral primary osteoarthritis, unspecified knee: Secondary | ICD-10-CM | POA: Diagnosis present

## 2017-03-11 HISTORY — PX: TOTAL KNEE ARTHROPLASTY: SHX125

## 2017-03-11 LAB — TYPE AND SCREEN
ABO/RH(D): O POS
ANTIBODY SCREEN: NEGATIVE

## 2017-03-11 LAB — GLUCOSE, CAPILLARY
GLUCOSE-CAPILLARY: 157 mg/dL — AB (ref 65–99)
GLUCOSE-CAPILLARY: 154 mg/dL — AB (ref 65–99)
GLUCOSE-CAPILLARY: 155 mg/dL — AB (ref 65–99)
GLUCOSE-CAPILLARY: 180 mg/dL — AB (ref 65–99)
Glucose-Capillary: 184 mg/dL — ABNORMAL HIGH (ref 65–99)

## 2017-03-11 SURGERY — ARTHROPLASTY, KNEE, TOTAL
Anesthesia: Spinal | Site: Knee | Laterality: Right

## 2017-03-11 MED ORDER — INSULIN ASPART 100 UNIT/ML ~~LOC~~ SOLN
0.0000 [IU] | Freq: Three times a day (TID) | SUBCUTANEOUS | Status: DC
  Administered 2017-03-11 – 2017-03-12 (×4): 3 [IU] via SUBCUTANEOUS

## 2017-03-11 MED ORDER — ACETAMINOPHEN 325 MG PO TABS
650.0000 mg | ORAL_TABLET | ORAL | Status: DC | PRN
  Administered 2017-03-12: 650 mg via ORAL
  Filled 2017-03-11: qty 2

## 2017-03-11 MED ORDER — BUPIVACAINE LIPOSOME 1.3 % IJ SUSP
INTRAMUSCULAR | Status: DC | PRN
  Administered 2017-03-11: 20 mL

## 2017-03-11 MED ORDER — RIVAROXABAN 10 MG PO TABS
10.0000 mg | ORAL_TABLET | Freq: Every day | ORAL | Status: DC
  Administered 2017-03-12: 10 mg via ORAL
  Filled 2017-03-11: qty 1

## 2017-03-11 MED ORDER — LOSARTAN POTASSIUM 50 MG PO TABS
100.0000 mg | ORAL_TABLET | Freq: Every day | ORAL | Status: DC
  Filled 2017-03-11: qty 2

## 2017-03-11 MED ORDER — ROPIVACAINE HCL 7.5 MG/ML IJ SOLN
INTRAMUSCULAR | Status: DC | PRN
  Administered 2017-03-11: 20 mL via PERINEURAL

## 2017-03-11 MED ORDER — SODIUM CHLORIDE 0.9 % IV SOLN
INTRAVENOUS | Status: DC
  Administered 2017-03-11 – 2017-03-12 (×2): via INTRAVENOUS

## 2017-03-11 MED ORDER — SODIUM CHLORIDE 0.9 % IJ SOLN
INTRAMUSCULAR | Status: AC
  Filled 2017-03-11: qty 50

## 2017-03-11 MED ORDER — ONDANSETRON HCL 4 MG PO TABS: 4.0000 mg | ORAL_TABLET | Freq: Four times a day (QID) | ORAL | Status: DC | PRN

## 2017-03-11 MED ORDER — CEFAZOLIN SODIUM-DEXTROSE 2-4 GM/100ML-% IV SOLN
2.0000 g | INTRAVENOUS | Status: AC
  Administered 2017-03-11: 2 g via INTRAVENOUS
  Filled 2017-03-11: qty 100

## 2017-03-11 MED ORDER — ACETAMINOPHEN 650 MG RE SUPP: 650.0000 mg | RECTAL | Status: DC | PRN

## 2017-03-11 MED ORDER — TRAMADOL HCL 50 MG PO TABS: 50.0000 mg | ORAL_TABLET | Freq: Four times a day (QID) | ORAL | Status: DC | PRN

## 2017-03-11 MED ORDER — SODIUM CHLORIDE 0.9 % IR SOLN
Status: DC | PRN
  Administered 2017-03-11: 1

## 2017-03-11 MED ORDER — LIDOCAINE 2% (20 MG/ML) 5 ML SYRINGE
INTRAMUSCULAR | Status: DC | PRN
  Administered 2017-03-11: 60 mg via INTRAVENOUS

## 2017-03-11 MED ORDER — PHENYLEPHRINE 40 MCG/ML (10ML) SYRINGE FOR IV PUSH (FOR BLOOD PRESSURE SUPPORT)
PREFILLED_SYRINGE | INTRAVENOUS | Status: DC | PRN
  Administered 2017-03-11 (×4): 80 ug via INTRAVENOUS

## 2017-03-11 MED ORDER — SODIUM CHLORIDE 0.9 % IJ SOLN
INTRAMUSCULAR | Status: DC | PRN
  Administered 2017-03-11: 60 mL

## 2017-03-11 MED ORDER — PHENYLEPHRINE 40 MCG/ML (10ML) SYRINGE FOR IV PUSH (FOR BLOOD PRESSURE SUPPORT)
PREFILLED_SYRINGE | INTRAVENOUS | Status: AC
  Filled 2017-03-11: qty 10

## 2017-03-11 MED ORDER — PHENOL 1.4 % MT LIQD
1.0000 | OROMUCOSAL | Status: DC | PRN
  Filled 2017-03-11: qty 177

## 2017-03-11 MED ORDER — GABAPENTIN 300 MG PO CAPS
300.0000 mg | ORAL_CAPSULE | Freq: Once | ORAL | Status: AC
  Administered 2017-03-11: 300 mg via ORAL
  Filled 2017-03-11: qty 1

## 2017-03-11 MED ORDER — DIPHENHYDRAMINE HCL 12.5 MG/5ML PO ELIX: 12.5000 mg | ORAL_SOLUTION | ORAL | Status: DC | PRN

## 2017-03-11 MED ORDER — ONDANSETRON HCL 4 MG/2ML IJ SOLN
INTRAMUSCULAR | Status: DC | PRN
  Administered 2017-03-11: 4 mg via INTRAVENOUS

## 2017-03-11 MED ORDER — BUPIVACAINE-EPINEPHRINE (PF) 0.5% -1:200000 IJ SOLN
INTRAMUSCULAR | Status: DC | PRN
  Administered 2017-03-11: 5 mL

## 2017-03-11 MED ORDER — CHLORHEXIDINE GLUCONATE 4 % EX LIQD: 60.0000 mL | Freq: Once | CUTANEOUS | Status: DC

## 2017-03-11 MED ORDER — PROPOFOL 500 MG/50ML IV EMUL
INTRAVENOUS | Status: DC | PRN
  Administered 2017-03-11: 100 ug/kg/min via INTRAVENOUS

## 2017-03-11 MED ORDER — METOCLOPRAMIDE HCL 5 MG PO TABS: 5.0000 mg | ORAL_TABLET | Freq: Three times a day (TID) | ORAL | Status: DC | PRN

## 2017-03-11 MED ORDER — LIDOCAINE 2% (20 MG/ML) 5 ML SYRINGE
INTRAMUSCULAR | Status: AC
  Filled 2017-03-11: qty 5

## 2017-03-11 MED ORDER — BISACODYL 10 MG RE SUPP: 10.0000 mg | Freq: Every day | RECTAL | Status: DC | PRN

## 2017-03-11 MED ORDER — DEXAMETHASONE SODIUM PHOSPHATE 10 MG/ML IJ SOLN
10.0000 mg | Freq: Once | INTRAMUSCULAR | Status: AC
  Administered 2017-03-12: 10 mg via INTRAVENOUS
  Filled 2017-03-11: qty 1

## 2017-03-11 MED ORDER — FENTANYL CITRATE (PF) 100 MCG/2ML IJ SOLN
INTRAMUSCULAR | Status: AC
  Filled 2017-03-11: qty 2

## 2017-03-11 MED ORDER — CEFAZOLIN SODIUM-DEXTROSE 2-4 GM/100ML-% IV SOLN
2.0000 g | Freq: Four times a day (QID) | INTRAVENOUS | Status: AC
  Administered 2017-03-11 (×2): 2 g via INTRAVENOUS
  Filled 2017-03-11 (×2): qty 100

## 2017-03-11 MED ORDER — FENTANYL CITRATE (PF) 100 MCG/2ML IJ SOLN: 25.0000 ug | INTRAMUSCULAR | Status: DC | PRN

## 2017-03-11 MED ORDER — PROPOFOL 10 MG/ML IV BOLUS
INTRAVENOUS | Status: AC
  Filled 2017-03-11: qty 60

## 2017-03-11 MED ORDER — MENTHOL 3 MG MT LOZG: 1.0000 | LOZENGE | OROMUCOSAL | Status: DC | PRN

## 2017-03-11 MED ORDER — SIMVASTATIN 20 MG PO TABS
40.0000 mg | ORAL_TABLET | Freq: Every day | ORAL | Status: DC
  Administered 2017-03-12: 40 mg via ORAL
  Filled 2017-03-11: qty 2

## 2017-03-11 MED ORDER — BUPIVACAINE HCL (PF) 0.25 % IJ SOLN
INTRAMUSCULAR | Status: AC
  Filled 2017-03-11: qty 30

## 2017-03-11 MED ORDER — TRANEXAMIC ACID 1000 MG/10ML IV SOLN
1000.0000 mg | Freq: Once | INTRAVENOUS | Status: AC
  Administered 2017-03-11: 1000 mg via INTRAVENOUS
  Filled 2017-03-11: qty 1100

## 2017-03-11 MED ORDER — DEXAMETHASONE SODIUM PHOSPHATE 10 MG/ML IJ SOLN
10.0000 mg | Freq: Once | INTRAMUSCULAR | Status: AC
  Administered 2017-03-11: 10 mg via INTRAVENOUS

## 2017-03-11 MED ORDER — LATANOPROST 0.005 % OP SOLN
1.0000 [drp] | Freq: Every day | OPHTHALMIC | Status: DC
  Administered 2017-03-11: 1 [drp] via OPHTHALMIC
  Filled 2017-03-11: qty 2.5

## 2017-03-11 MED ORDER — DEXTROSE 5 % IV SOLN
500.0000 mg | Freq: Four times a day (QID) | INTRAVENOUS | Status: DC | PRN
  Administered 2017-03-11: 500 mg via INTRAVENOUS
  Filled 2017-03-11: qty 550

## 2017-03-11 MED ORDER — FENTANYL CITRATE (PF) 100 MCG/2ML IJ SOLN
INTRAMUSCULAR | Status: DC | PRN
  Administered 2017-03-11 (×2): 50 ug via INTRAVENOUS

## 2017-03-11 MED ORDER — FLEET ENEMA 7-19 GM/118ML RE ENEM: 1.0000 | ENEMA | Freq: Once | RECTAL | Status: DC | PRN

## 2017-03-11 MED ORDER — METHOCARBAMOL 500 MG PO TABS
500.0000 mg | ORAL_TABLET | Freq: Four times a day (QID) | ORAL | Status: DC | PRN
Start: 2017-03-11 — End: 2017-03-12
  Administered 2017-03-12 (×2): 500 mg via ORAL
  Filled 2017-03-11 (×2): qty 1

## 2017-03-11 MED ORDER — POLYETHYLENE GLYCOL 3350 17 G PO PACK: 17.0000 g | PACK | Freq: Every day | ORAL | Status: DC | PRN

## 2017-03-11 MED ORDER — ACETAMINOPHEN 10 MG/ML IV SOLN
1000.0000 mg | Freq: Once | INTRAVENOUS | Status: AC
  Administered 2017-03-11: 1000 mg via INTRAVENOUS
  Filled 2017-03-11: qty 100

## 2017-03-11 MED ORDER — LACTATED RINGERS IV SOLN
INTRAVENOUS | Status: DC
  Administered 2017-03-11 (×2): via INTRAVENOUS

## 2017-03-11 MED ORDER — DOCUSATE SODIUM 100 MG PO CAPS
100.0000 mg | ORAL_CAPSULE | Freq: Two times a day (BID) | ORAL | Status: DC
  Administered 2017-03-11 – 2017-03-12 (×2): 100 mg via ORAL
  Filled 2017-03-11 (×2): qty 1

## 2017-03-11 MED ORDER — 0.9 % SODIUM CHLORIDE (POUR BTL) OPTIME
TOPICAL | Status: DC | PRN
  Administered 2017-03-11: 1000 mL

## 2017-03-11 MED ORDER — METOCLOPRAMIDE HCL 5 MG/ML IJ SOLN: 5.0000 mg | Freq: Three times a day (TID) | INTRAMUSCULAR | Status: DC | PRN

## 2017-03-11 MED ORDER — OXYCODONE HCL 5 MG PO TABS
5.0000 mg | ORAL_TABLET | ORAL | Status: DC | PRN
  Administered 2017-03-12 (×2): 5 mg via ORAL
  Filled 2017-03-11 (×2): qty 1

## 2017-03-11 MED ORDER — MIDAZOLAM HCL 2 MG/2ML IJ SOLN
INTRAMUSCULAR | Status: AC
  Filled 2017-03-11: qty 2

## 2017-03-11 MED ORDER — ACETAMINOPHEN 500 MG PO TABS
1000.0000 mg | ORAL_TABLET | Freq: Four times a day (QID) | ORAL | Status: AC
  Administered 2017-03-11 – 2017-03-12 (×4): 1000 mg via ORAL
  Filled 2017-03-11 (×4): qty 2

## 2017-03-11 MED ORDER — STERILE WATER FOR IRRIGATION IR SOLN
Status: DC | PRN
  Administered 2017-03-11: 2000 mL

## 2017-03-11 MED ORDER — ONDANSETRON HCL 4 MG/2ML IJ SOLN: 4.0000 mg | Freq: Four times a day (QID) | INTRAMUSCULAR | Status: DC | PRN

## 2017-03-11 MED ORDER — SODIUM CHLORIDE 0.9 % IJ SOLN
INTRAMUSCULAR | Status: AC
  Filled 2017-03-11: qty 20

## 2017-03-11 MED ORDER — OXYCODONE HCL 5 MG PO TABS: 10.0000 mg | ORAL_TABLET | ORAL | Status: DC | PRN

## 2017-03-11 MED ORDER — MIDAZOLAM HCL 5 MG/5ML IJ SOLN
INTRAMUSCULAR | Status: DC | PRN
  Administered 2017-03-11 (×2): 1 mg via INTRAVENOUS

## 2017-03-11 MED ORDER — MORPHINE SULFATE (PF) 2 MG/ML IV SOLN: 1.0000 mg | INTRAVENOUS | Status: DC | PRN

## 2017-03-11 SURGICAL SUPPLY — 51 items
BAG DECANTER FOR FLEXI CONT (MISCELLANEOUS) ×2 IMPLANT
BAG ZIPLOCK 12X15 (MISCELLANEOUS) ×2 IMPLANT
BANDAGE ACE 6X5 VEL STRL LF (GAUZE/BANDAGES/DRESSINGS) ×2 IMPLANT
BLADE SAG 18X100X1.27 (BLADE) ×2 IMPLANT
BLADE SAW SGTL 11.0X1.19X90.0M (BLADE) ×2 IMPLANT
BNDG CONFORM 6X.82 1P STRL (GAUZE/BANDAGES/DRESSINGS) ×2 IMPLANT
BOWL SMART MIX CTS (DISPOSABLE) ×2 IMPLANT
CAPT KNEE TOTAL 3 ATTUNE ×2 IMPLANT
CEMENT HV SMART SET (Cement) ×4 IMPLANT
COVER SURGICAL LIGHT HANDLE (MISCELLANEOUS) ×2 IMPLANT
CUFF TOURN SGL QUICK 34 (TOURNIQUET CUFF) ×1
CUFF TRNQT CYL 34X4X40X1 (TOURNIQUET CUFF) ×1 IMPLANT
DECANTER SPIKE VIAL GLASS SM (MISCELLANEOUS) ×2 IMPLANT
DRAPE U-SHAPE 47X51 STRL (DRAPES) ×2 IMPLANT
DRSG ADAPTIC 3X8 NADH LF (GAUZE/BANDAGES/DRESSINGS) ×2 IMPLANT
DRSG PAD ABDOMINAL 8X10 ST (GAUZE/BANDAGES/DRESSINGS) ×2 IMPLANT
DURAPREP 26ML APPLICATOR (WOUND CARE) ×2 IMPLANT
ELECT REM PT RETURN 15FT ADLT (MISCELLANEOUS) ×2 IMPLANT
EVACUATOR 1/8 PVC DRAIN (DRAIN) ×2 IMPLANT
GAUZE SPONGE 4X4 12PLY STRL (GAUZE/BANDAGES/DRESSINGS) ×2 IMPLANT
GLOVE BIO SURGEON STRL SZ7.5 (GLOVE) IMPLANT
GLOVE BIO SURGEON STRL SZ8 (GLOVE) ×2 IMPLANT
GLOVE BIOGEL PI IND STRL 6.5 (GLOVE) IMPLANT
GLOVE BIOGEL PI IND STRL 8 (GLOVE) ×1 IMPLANT
GLOVE BIOGEL PI INDICATOR 6.5 (GLOVE)
GLOVE BIOGEL PI INDICATOR 8 (GLOVE) ×1
GLOVE SURG SS PI 6.5 STRL IVOR (GLOVE) IMPLANT
GOWN STRL REUS W/TWL LRG LVL3 (GOWN DISPOSABLE) ×2 IMPLANT
GOWN STRL REUS W/TWL XL LVL3 (GOWN DISPOSABLE) IMPLANT
HANDPIECE INTERPULSE COAX TIP (DISPOSABLE) ×1
IMMOBILIZER KNEE 20 (SOFTGOODS) ×2
IMMOBILIZER KNEE 20 THIGH 36 (SOFTGOODS) ×1 IMPLANT
MANIFOLD NEPTUNE II (INSTRUMENTS) ×2 IMPLANT
NS IRRIG 1000ML POUR BTL (IV SOLUTION) ×2 IMPLANT
PACK TOTAL KNEE CUSTOM (KITS) ×2 IMPLANT
PAD ABD 8X10 STRL (GAUZE/BANDAGES/DRESSINGS) ×2 IMPLANT
PADDING CAST COTTON 6X4 STRL (CAST SUPPLIES) ×6 IMPLANT
POSITIONER SURGICAL ARM (MISCELLANEOUS) ×2 IMPLANT
SET HNDPC FAN SPRY TIP SCT (DISPOSABLE) ×1 IMPLANT
STRIP CLOSURE SKIN 1/2X4 (GAUZE/BANDAGES/DRESSINGS) ×4 IMPLANT
SUT MNCRL AB 4-0 PS2 18 (SUTURE) ×2 IMPLANT
SUT STRATAFIX 0 PDS 27 VIOLET (SUTURE) ×2
SUT VIC AB 2-0 CT1 27 (SUTURE) ×3
SUT VIC AB 2-0 CT1 TAPERPNT 27 (SUTURE) ×3 IMPLANT
SUTURE STRATFX 0 PDS 27 VIOLET (SUTURE) ×1 IMPLANT
SYR 30ML LL (SYRINGE) ×4 IMPLANT
TAPE STRIPS DRAPE STRL (GAUZE/BANDAGES/DRESSINGS) ×2 IMPLANT
TRAY FOLEY W/METER SILVER 16FR (SET/KITS/TRAYS/PACK) ×2 IMPLANT
WATER STERILE IRR 1000ML POUR (IV SOLUTION) ×4 IMPLANT
WRAP KNEE MAXI GEL POST OP (GAUZE/BANDAGES/DRESSINGS) ×2 IMPLANT
YANKAUER SUCT BULB TIP 10FT TU (MISCELLANEOUS) ×2 IMPLANT

## 2017-03-11 NOTE — Plan of Care (Signed)
Plan of care discussed.   

## 2017-03-11 NOTE — Op Note (Signed)
OPERATIVE REPORT-TOTAL KNEE ARTHROPLASTY   Pre-operative diagnosis- Osteoarthritis  Right knee(s)  Post-operative diagnosis- Osteoarthritis Right knee(s)  Procedure-  Right  Total Knee Arthroplasty  Surgeon- Dione Plover. Prezley Qadir, MD  Assistant- Ardeen Jourdain, PA-C   Anesthesia-  Adductor canal block and spinal  EBL-25 mL   Drains Hemovac  Tourniquet time-  Total Tourniquet Time Documented: Thigh (Right) - 30 minutes Total: Thigh (Right) - 30 minutes     Complications- None  Condition-PACU - hemodynamically stable.   Brief Clinical Note  [name] [name] is a 72 y.o. year old female with end stage OA of her right knee with progressively worsening pain and dysfunction. She has constant pain, with activity and at rest and significant functional deficits with difficulties even with ADLs. She has had extensive non-op management including analgesics, injections of cortisone and viscosupplements, and home exercise program, but remains in significant pain with significant dysfunction.Radiographs show bone on bone arthritis lateral and patellofemoral. She presents now for right Total Knee Arthroplasty.    Procedure in detail---   The patient is brought into the operating room and positioned supine on the operating table. After successful administration of  Adductor canal block and spinal,   a tourniquet is placed high on the  Right thigh(s) and the lower extremity is prepped and draped in the usual sterile fashion. Time out is performed by the operating team and then the  Right lower extremity is wrapped in Esmarch, knee flexed and the tourniquet inflated to 300 mmHg.       A midline incision is made with a ten blade through the subcutaneous tissue to the level of the extensor mechanism. A fresh blade is used to make a medial parapatellar arthrotomy. Soft tissue over the proximal medial tibia is subperiosteally elevated to the joint line with a knife and into the semimembranosus bursa with a  Cobb elevator. Soft tissue over the proximal lateral tibia is elevated with attention being paid to avoiding the patellar tendon on the tibial tubercle. The patella is everted, knee flexed 90 degrees and the ACL and PCL are removed. Findings are bone on bone lateral and patellofemoral with large global osteophytes.        The drill is used to create a starting hole in the distal femur and the canal is thoroughly irrigated with sterile saline to remove the fatty contents. The 5 degree Right  valgus alignment guide is placed into the femoral canal and the distal femoral cutting block is pinned to remove 9 mm off the distal femur. Resection is made with an oscillating saw.      The tibia is subluxed forward and the menisci are removed. The extramedullary alignment guide is placed referencing proximally at the medial aspect of the tibial tubercle and distally along the second metatarsal axis and tibial crest. The block is pinned to remove 68mm off the more deficient lateral  side. Resection is made with an oscillating saw. Size 5is the most appropriate size for the tibia and the proximal tibia is prepared with the modular drill and keel punch for that size.      The femoral sizing guide is placed and size 5 is most appropriate. Rotation is marked off the epicondylar axis and confirmed by creating a rectangular flexion gap at 90 degrees. The size 5 cutting block is pinned in this rotation and the anterior, posterior and chamfer cuts are made with the oscillating saw. The intercondylar block is then placed and that cut is made.  Trial size 5 tibial component, trial size 5 narrow posterior stabilized femur and a 10  mm posterior stabilized rotating platform insert trial is placed. Full extension is achieved with excellent varus/valgus and anterior/posterior balance throughout full range of motion. The patella is everted and thickness measured to be 22  mm. Free hand resection is taken to 12 mm, a 5 template is  placed, lug holes are drilled, trial patella is placed, and it tracks normally. Osteophytes are removed off the posterior femur with the trial in place. All trials are removed and the cut bone surfaces prepared with pulsatile lavage. Cement is mixed and once ready for implantation, the size 5 tibial implant, size  5 narrow posterior stabilized femoral component, and the size 35 patella are cemented in place and the patella is held with the clamp. The trial insert is placed and the knee held in full extension. The Exparel (20 ml mixed with 60 ml saline) is injected into the extensor mechanism, posterior capsule, medial and lateral gutters and subcutaneous tissues.  All extruded cement is removed and once the cement is hard the permanent 10 mm posterior stabilized rotating platform insert is placed into the tibial tray.      The wound is copiously irrigated with saline solution and the extensor mechanism closed over a hemovac drain with #1 V-loc suture. The tourniquet is released for a total tourniquet time of 30  minutes. Flexion against gravity is 140 degrees and the patella tracks normally. Subcutaneous tissue is closed with 2.0 vicryl and subcuticular with running 4.0 Monocryl. The incision is cleaned and dried and steri-strips and a bulky sterile dressing are applied. The limb is placed into a knee immobilizer and the patient is awakened and transported to recovery in stable condition.      Please note that a surgical assistant was a medical necessity for this procedure in order to perform it in a safe and expeditious manner. Surgical assistant was necessary to retract the ligaments and vital neurovascular structures to prevent injury to them and also necessary for proper positioning of the limb to allow for anatomic placement of the prosthesis.   Dione Plover Yitzchak Kothari, MD    03/11/2017, 8:18 AM

## 2017-03-11 NOTE — Interval H&P Note (Signed)
History and Physical Interval Note:  03/11/2017 6:32 AM  [name] [name]  has presented today for surgery, with the diagnosis of Osteoarthritis Right knee  The various methods of treatment have been discussed with the patient and family. After consideration of risks, benefits and other options for treatment, the patient has consented to  Procedure(s): RIGHT TOTAL KNEE ARTHROPLASTY (Right) as a surgical intervention .  The patient's history has been reviewed, patient examined, no change in status, stable for surgery.  I have reviewed the patient's chart and labs.  Questions were answered to the patient's satisfaction.     Pilar Plate Parveen Freehling

## 2017-03-11 NOTE — Anesthesia Procedure Notes (Signed)
Anesthesia Regional Block: Adductor canal block   Pre-Anesthetic Checklist: ,, timeout performed, Correct Patient, Correct Site, Correct Laterality, Correct Procedure, Correct Position, site marked, Risks and benefits discussed, pre-op evaluation,  At surgeon's request and post-op pain management  Laterality: Right  Prep: chloraprep       Needles:   Needle Type: Echogenic Needle     Needle Length: 9cm  Needle Gauge: 21     Additional Needles:   Procedures:,,,, ultrasound used (permanent image in chart),,,,  Narrative:  Start time: 03/11/2017 6:53 AM End time: 03/11/2017 7:00 AM Injection made incrementally with aspirations every 5 mL. Anesthesiologist: Lyndle Herrlich, MD

## 2017-03-11 NOTE — Anesthesia Procedure Notes (Signed)
Date/Time: 03/11/2017 7:30 AM Performed by: Claudia Desanctis, CRNA Oxygen Delivery Method: Simple face mask

## 2017-03-11 NOTE — Evaluation (Signed)
Physical Therapy Evaluation Patient Details Name: [name] [name] MRN: [id number] DOB: 06-07-1945 Today's Date: 03/11/2017   History of Present Illness  R tka, [name] 8/18  Clinical Impression  The patient tolerated ambulating x 30' with RW. Should progress well. Pt admitted with above diagnosis. Pt currently with functional limitations due to the deficits listed below (see PT Problem List).  Pt will benefit from skilled PT to increase their independence and safety with mobility to allow discharge to the venue listed below.       Follow Up Recommendations DC plan and follow up therapy as arranged by surgeon;Home health PT    Equipment Recommendations  None recommended by PT    Recommendations for Other Services       Precautions / Restrictions Precautions Precautions: Knee Required Braces or Orthoses: Knee Immobilizer - Right Knee Immobilizer - Right: Discontinue once straight leg raise with < 10 degree lag      Mobility  Bed Mobility Overal bed mobility: Needs Assistance Bed Mobility: Supine to Sit;Sit to Supine     Supine to sit: Min assist Sit to supine: Min assist   General bed mobility comments: cues for technique and  support right leg.  Transfers Overall transfer level: Needs assistance Equipment used: Rolling walker (2 wheeled) Transfers: Sit to/from Stand Sit to Stand: Min assist         General transfer comment: cues for hand and right leg position  Ambulation/Gait Ambulation/Gait assistance: Min assist Ambulation Distance (Feet): 30 Feet Assistive device: Rolling walker (2 wheeled) Gait Pattern/deviations: Step-to pattern;Antalgic     General Gait Details: cuees for sequence and posture  Stairs            Wheelchair Mobility    Modified Rankin (Stroke Patients Only)       Balance                                             Pertinent Vitals/Pain Pain Assessment: 0-10 Pain Score: 4  Pain Location: rigtht  knee Pain Descriptors / Indicators: Aching;Discomfort Pain Intervention(s): Monitored during session;Premedicated before session;Repositioned;Patient requesting pain meds-RN notified;Ice applied    Home Living Family/patient expects to be discharged to:: Private residence Living Arrangements: Spouse/significant other Available Help at Discharge: Family Type of Home: House Home Access: Stairs to enter   Technical brewer of Steps: 1 Home Layout: One level Home Equipment: Bedside commode;Tub bench;Walker - 2 wheels      Prior Function Level of Independence: Independent               Hand Dominance        Extremity/Trunk Assessment   Upper Extremity Assessment Upper Extremity Assessment: Overall WFL for tasks assessed    Lower Extremity Assessment Lower Extremity Assessment: RLE deficits/detail RLE Deficits / Details: performed SLR     Cervical / Trunk Assessment Cervical / Trunk Assessment: Normal  Communication   Communication: No difficulties  Cognition Arousal/Alertness: Awake/alert Behavior During Therapy: WFL for tasks assessed/performed Overall Cognitive Status: Within Functional Limits for tasks assessed                                        General Comments      Exercises     Assessment/Plan    PT Assessment Patient needs continued  PT services  PT Problem List Decreased strength;Decreased range of motion;Decreased knowledge of use of DME;Decreased activity tolerance;Decreased safety awareness;Decreased balance;Decreased knowledge of precautions;Decreased mobility;Pain       PT Treatment Interventions DME instruction;Gait training;Stair training;Functional mobility training;Therapeutic activities;Therapeutic exercise;Patient/family education    PT Goals (Current goals can be found in the Care Plan section)  Acute Rehab PT Goals Patient Stated Goal: to go home PT Goal Formulation: With patient/family Time For Goal  Achievement: 03/14/17 Potential to Achieve Goals: Good    Frequency 7X/week   Barriers to discharge        Co-evaluation               AM-PAC PT "6 Clicks" Daily Activity  Outcome Measure Difficulty turning over in bed (including adjusting bedclothes, sheets and blankets)?: A Little Difficulty moving from lying on back to sitting on the side of the bed? : A Little Difficulty sitting down on and standing up from a chair with arms (e.g., wheelchair, bedside commode, etc,.)?: A Little Help needed moving to and from a bed to chair (including a wheelchair)?: A Little Help needed walking in hospital room?: A Lot Help needed climbing 3-5 steps with a railing? : A Lot 6 Click Score: 16    End of Session Equipment Utilized During Treatment: Right knee immobilizer Activity Tolerance: Patient tolerated treatment well Patient left: in bed;with call bell/phone within reach;in CPM   PT Visit Diagnosis: Unsteadiness on feet (R26.81);Pain Pain - Right/Left: Right Pain - part of body: Knee    Time: 1607-3710 PT Time Calculation (min) (ACUTE ONLY): 28 min   Charges:   PT Evaluation $PT Eval Low Complexity: 1 Low PT Treatments $Gait Training: 8-22 mins   PT G CodesTresa Endo PT [phone]   [name] 03/11/2017, 5:51 PM

## 2017-03-11 NOTE — Transfer of Care (Signed)
Immediate Anesthesia Transfer of Care Note  Patient: [name] [name]  Procedure(s) Performed: RIGHT TOTAL KNEE ARTHROPLASTY (Right Knee)  Patient Location: PACU  Anesthesia Type:Spinal  Level of Consciousness: awake, alert , oriented and patient cooperative  Airway & Oxygen Therapy: Patient Spontanous Breathing and Patient connected to face mask  Post-op Assessment: Report given to RN and Post -op Vital signs reviewed and stable  Post vital signs: Reviewed and stable  Last Vitals:  Vitals:   03/11/17 0602  BP: 138/76  Pulse: 77  Resp: 16  Temp: 37.5 C  SpO2: 95%    Last Pain:  Vitals:   03/11/17 0602  TempSrc: Oral      Patients Stated Pain Goal: 4 (18/48/59 2763)  Complications: No apparent anesthesia complications

## 2017-03-11 NOTE — Anesthesia Procedure Notes (Signed)
Spinal  Patient location during procedure: OR Staffing Anesthesiologist: Lyndle Herrlich, MD Spinal Block Patient position: sitting Prep: DuraPrep Patient monitoring: heart rate, blood pressure and continuous pulse ox Approach: right paramedian Location: L3-4 Injection technique: single-shot Needle Needle type: Sprotte  Needle gauge: 24 G Needle length: 9 cm Assessment Sensory level: T4 Additional Notes Spinal Dosage in OR  .75% Bupivicaine ml       1.9 RLD x 3 min

## 2017-03-11 NOTE — Anesthesia Postprocedure Evaluation (Signed)
Anesthesia Post Note  Patient: [name] [name]  Procedure(s) Performed: RIGHT TOTAL KNEE ARTHROPLASTY (Right Knee)     Patient location during evaluation: PACU Anesthesia Type: Spinal Level of consciousness: awake Pain management: satisfactory to patient Vital Signs Assessment: post-procedure vital signs reviewed and stable Respiratory status: spontaneous breathing Cardiovascular status: blood pressure returned to baseline Postop Assessment: no headache and spinal receding Anesthetic complications: no    Last Vitals:  Vitals:   03/11/17 1406 03/11/17 1801  BP: (!) 136/50 118/66  Pulse: 80 82  Resp: 16 16  Temp: (!) 36.3 C 36.8 C  SpO2: 100% 100%    Last Pain:  Vitals:   03/11/17 1850  TempSrc:   PainSc: [place]

## 2017-03-12 DIAGNOSIS — M1711 Unilateral primary osteoarthritis, right knee: Secondary | ICD-10-CM | POA: Diagnosis not present

## 2017-03-12 LAB — GLUCOSE, CAPILLARY
GLUCOSE-CAPILLARY: 159 mg/dL — AB (ref 65–99)
Glucose-Capillary: 155 mg/dL — ABNORMAL HIGH (ref 65–99)

## 2017-03-12 LAB — BASIC METABOLIC PANEL
ANION GAP: 10 (ref 5–15)
BUN: 20 mg/dL (ref 6–20)
CHLORIDE: 108 mmol/L (ref 101–111)
CO2: 22 mmol/L (ref 22–32)
Calcium: 8.7 mg/dL — ABNORMAL LOW (ref 8.9–10.3)
Creatinine, Ser: 0.67 mg/dL (ref 0.44–1.00)
GFR calc non Af Amer: >60 mL/min (ref >60)
Glucose, Bld: 133 mg/dL — ABNORMAL HIGH (ref 65–99)
Potassium: 4.9 mmol/L (ref 3.5–5.1)
SODIUM: 140 mmol/L (ref 135–145)

## 2017-03-12 LAB — CBC
HCT: 38 % (ref 36.0–46.0)
Hemoglobin: 12.6 g/dL (ref 12.0–15.0)
MCH: 30.7 pg (ref 26.0–34.0)
MCHC: 33.2 g/dL (ref 30.0–36.0)
MCV: 92.7 fL (ref 78.0–100.0)
PLATELETS: 267 10*3/uL (ref 150–400)
RBC: 4.1 MIL/uL (ref 3.87–5.11)
RDW: 12.8 % (ref 11.5–15.5)
WBC: 15.9 10*3/uL — ABNORMAL HIGH (ref 4.0–10.5)

## 2017-03-12 MED ORDER — RIVAROXABAN 10 MG PO TABS: 10.0000 mg | ORAL_TABLET | Freq: Every day | ORAL | 0 refills | Status: DC

## 2017-03-12 MED ORDER — METHOCARBAMOL 500 MG PO TABS: 500.0000 mg | ORAL_TABLET | Freq: Four times a day (QID) | ORAL | 0 refills | Status: DC | PRN

## 2017-03-12 MED ORDER — OXYCODONE HCL 5 MG PO TABS: 5.0000 mg | ORAL_TABLET | ORAL | 0 refills | Status: DC | PRN

## 2017-03-12 MED ORDER — TRAMADOL HCL 50 MG PO TABS: 50.0000 mg | ORAL_TABLET | Freq: Four times a day (QID) | ORAL | 0 refills | Status: DC | PRN

## 2017-03-12 NOTE — Progress Notes (Signed)
Discharge planning, spoke with patient at bedside. Have chosen [facility] for HH PT, evaluate and treat. Contacted [facility] for referral. Has RW and 3n1. [phone] 

## 2017-03-12 NOTE — Care Management CC44 (Signed)
Condition Code 44 Documentation Completed  Patient Details  Name: [name] [name] MRN: [id number] Date of Birth: Feb 17, 1945   Condition Code 44 given:    Patient signature on Condition Code 44 notice:    Documentation of 2 MD's agreement:    Code 44 added to claim:       Leeroy Cha, RN 03/12/2017, 12:13 PM

## 2017-03-12 NOTE — Progress Notes (Signed)
Physical Therapy Treatment Patient Details Name: [name] [name] MRN: [id number] DOB: November 23, 1945 Today's Date: 03/12/2017    History of Present Illness R tka, [name] [name] 8/18    PT Comments    The patient is feeling better, pain is better. Reviewed use of KI for  Ride home, remove in house. Ready for DC.   Follow Up Recommendations  DC plan and follow up therapy as arranged by surgeon;Home health PT     Equipment Recommendations  None recommended by PT    Recommendations for Other Services       Precautions / Restrictions Precautions Precautions: Knee Precaution Comments: did not use    Mobility          Balance                                            Cognition Arousal/Alertness: Awake/alert                                            Exercises Total Joint Exercises Ankle Circles/Pumps: AROM;Both;10 reps Quad Sets: AROM;Both Towel Squeeze: AROM;Right;Both;10 reps Short Arc Quad: AROM;Right;10 reps Heel Slides: AAROM;Right;10 reps Hip ABduction/ADduction: AAROM;Right;10 reps Straight Leg Raises: AAROM;Right;10 reps    General Comments        Pertinent Vitals/Pain Pain Score: 2  Pain Location: rigtht knee Pain Descriptors / Indicators: Discomfort Pain Intervention(s): Monitored during session;Premedicated before session    Home Living                      Prior Function            PT Goals (current goals can now be found in the care plan section) Progress towards PT goals: Progressing toward goals    Frequency           PT Plan Current plan remains appropriate    Co-evaluation              AM-PAC PT "6 Clicks" Daily Activity  Outcome Measure  Difficulty turning over in bed (including adjusting bedclothes, sheets and blankets)?: A Little Difficulty moving from lying on back to sitting on the side of the bed? : A Little Difficulty sitting down on and standing up from a chair with arms  (e.g., wheelchair, bedside commode, etc,.)?: A Little Help needed moving to and from a bed to chair (including a wheelchair)?: A Little Help needed walking in hospital room?: A Lot Help needed climbing 3-5 steps with a railing? : A Lot 6 Click Score: 16    End of Session Equipment Utilized During Treatment: Right knee immobilizer Activity Tolerance: Patient tolerated treatment well Patient left: in bed;with call bell/phone within reach Nurse Communication: Mobility status PT Visit Diagnosis: Unsteadiness on feet (R26.81);Pain Pain - Right/Left: Right Pain - part of body: Knee     Time: 3662-9476 PT Time Calculation (min) (ACUTE ONLY): 28 min  Charges: $Therapeutic Exercise: 8-22 mins $Self Care/Home Management: 03-Oct-2022                    G Codes:          [name] [name] 03/12/2017, 5:21 PM

## 2017-03-12 NOTE — Progress Notes (Signed)
OT Cancellation Note  Patient Details Name: [name] [name] MRN: [id number] DOB: 09-07-1945   Cancelled Treatment:    Reason Eval/Treat Not Completed: OT screened, no needs identified, will sign off  Charlese Gruetzmacher 03/12/2017, 1:51 PM  Lesle Chris, OTR/L [phone] 03/12/2017

## 2017-03-12 NOTE — Care Management Obs Status (Signed)
[place] NOTIFICATION   Patient Details  Name: [name] [name] MRN: [id number] Date of Birth: [birth year]   Medicare Observation Status Notification Given:       Leeroy Cha, RN 03/12/2017, 12:14 PM

## 2017-03-12 NOTE — Progress Notes (Signed)
Physical Therapy Treatment Patient Details Name: [name] [name] MRN: [id number] DOB: 1945-02-12 Today's Date: 03/12/2017    History of Present Illness R tka, [name] 8/18    PT Comments    The patient with drop in BP and some dizziness. Will check back later    Follow Up Recommendations  DC plan and follow up therapy as arranged by surgeon;Home health PT     Equipment Recommendations  None recommended by PT    Recommendations for Other Services       Precautions / Restrictions Precautions Precautions: Knee Precaution Comments: did not use    Mobility  Bed Mobility         Supine to sit: Min guard     General bed mobility comments: cues for technique and  support right leg.  Transfers Overall transfer level: Needs assistance   Transfers: Sit to/from Stand Sit to Stand: Min assist         General transfer comment: cues for hand and right leg position  Ambulation/Gait Ambulation/Gait assistance: Min assist Ambulation Distance (Feet): 30 Feet(x 2) Assistive device: Rolling walker (2 wheeled) Gait Pattern/deviations: Step-to pattern;Step-through pattern     General Gait Details: complained of feeling dizzy. BP 157/72 supine, 132/55 after ambulation. RN aware   Stairs            Wheelchair Mobility    Modified Rankin (Stroke Patients Only)       Balance                                            Cognition Arousal/Alertness: Awake/alert                                            Exercises Total Joint Exercises Ankle Circles/Pumps: AROM;Both;10 reps Quad Sets: AROM;Both Towel Squeeze: AROM;Right;Both;10 reps Short Arc Quad: AROM;Right;10 reps Heel Slides: AAROM;Right;10 reps Hip ABduction/ADduction: AAROM;Right;10 reps Straight Leg Raises: AAROM;Right;10 reps    General Comments        Pertinent Vitals/Pain Pain Score: 3  Pain Location: rigtht knee Pain Descriptors / Indicators:  Discomfort Pain Intervention(s): Premedicated before session;Ice applied    Home Living                      Prior Function            PT Goals (current goals can now be found in the care plan section) Progress towards PT goals: Progressing toward goals    Frequency           PT Plan Current plan remains appropriate    Co-evaluation              AM-PAC PT "6 Clicks" Daily Activity  Outcome Measure  Difficulty turning over in bed (including adjusting bedclothes, sheets and blankets)?: A Little Difficulty moving from lying on back to sitting on the side of the bed? : A Little Difficulty sitting down on and standing up from a chair with arms (e.g., wheelchair, bedside commode, etc,.)?: A Little Help needed moving to and from a bed to chair (including a wheelchair)?: A Little Help needed walking in hospital room?: A Lot Help needed climbing 3-5 steps with a railing? : A Lot 6 Click Score: 16    End  of Session   Activity Tolerance: Patient tolerated treatment well Patient left: in chair;with call bell/phone within reach Nurse Communication: Mobility status PT Visit Diagnosis: Unsteadiness on feet (R26.81);Pain Pain - Right/Left: Right Pain - part of body: Knee     Time: 1123-1202 PT Time Calculation (min) (ACUTE ONLY): 39 min  Charges:  $Gait Training: 8-22 mins $Therapeutic Exercise: 8-22 mins $Self Care/Home Management: 2022-10-06                    G Codes:       {    [name] 03/12/2017, 5:18 PM

## 2017-03-12 NOTE — Progress Notes (Signed)
   Subjective: 1 Day Post-Op Procedure(s) (LRB): RIGHT TOTAL KNEE ARTHROPLASTY (Right) Patient reports pain as mild.   Patient seen in rounds by Dr. Wynelle Link. Patient is well, but has had some minor complaints of pain in the knee, requiring pain medications We will start therapy today.  If they do well with therapy and meets all goals, then will allow home later this afternoon following therapy. Plan is to go Home after hospital stay.  Objective: Vital signs in last 24 hours: Temp:  [97.3 F (36.3 C)-98.4 F (36.9 C)] 97.7 F (36.5 C) (02/05 0928) Pulse Rate:  [67-82] 69 (02/05 0928) Resp:  [14-17] 17 (02/05 0928) BP: (118-139)/(47-91) 124/56 (02/05 0928) SpO2:  [95 %-100 %] 99 % (02/05 0928) Weight:  [91.6 kg (202 lb)] 91.6 kg (202 lb) (02/04 1010)  Intake/Output from previous day:  Intake/Output Summary (Last 24 hours) at 03/12/2017 0945 Last data filed at 03/12/2017 6834 Gross per 24 hour  Intake 2936.25 ml  Output 3595 ml  Net -658.75 ml    Intake/Output this shift: Total I/O In: 240 [P.O.:240] Out: 60 [Urine:60]  Labs: Recent Labs    03/12/17 0814  HGB 12.6   Recent Labs    03/12/17 0814  WBC 15.9*  RBC 4.10  HCT 38.0  PLT 267   Recent Labs    03/12/17 0638  NA 140  K 4.9  CL 108  CO2 22  BUN 20  CREATININE 0.67  GLUCOSE 133*  CALCIUM 8.7*   No results for input(s): LABPT, INR in the last 72 hours.  EXAM General - Patient is Alert, Appropriate and Oriented Extremity - Neurovascular intact Sensation intact distally Intact pulses distally Dorsiflexion/Plantar flexion intact Dressing - dressing C/D/I Motor Function - intact, moving foot and toes well on exam.  Hemovac pulled without difficulty.  Past Medical History:  Diagnosis Date  . DIVERTICULOSIS, COLON 09/10/2007  . DM 03/26/2008  . Glaucoma 03/17/2011  . HYPERLIPIDEMIA 09/10/2007  . HYPERTENSION 09/10/2007  . Hypertension   . Osteoarthritis 10/21/2011  . Shingles 03/17/2011     Assessment/Plan: 1 Day Post-Op Procedure(s) (LRB): RIGHT TOTAL KNEE ARTHROPLASTY (Right) Active Problems:   OA (osteoarthritis) of knee  Estimated body mass index is 33.61 kg/m as calculated from the following:   Height as of this encounter: 5\' 5"  (1.651 m).   Weight as of this encounter: 91.6 kg (202 lb). Up with therapy  DVT Prophylaxis - Xarelto Weight-Bearing as tolerated to right leg D/C O2 and Pulse OX and try on Room Air  If meets goals and able to go home: Diet - Cardiac diet and Diabetic diet Follow up - in 2 weeks Activity - WBAT Disposition - Home Condition Upon Discharge - pending therapy D/C Meds - See DC Summary DVT Prophylaxis - [place], PA-C Orthopaedic Surgery

## 2017-03-12 NOTE — Discharge Instructions (Signed)
° °Dr. Frank Aluisio °Total Joint Specialist °[facility] °[address] °[place], [place] [address] °[phone] ° °TOTAL KNEE REPLACEMENT POSTOPERATIVE DIRECTIONS ° °Knee Rehabilitation, Guidelines Following Surgery  °Results after knee surgery are often greatly improved when you follow the exercise, range of motion and muscle strengthening exercises prescribed by your doctor. Safety measures are also important to protect the knee from further injury. Any time any of these exercises cause you to have increased pain or swelling in your knee joint, decrease the amount until you are comfortable again and slowly increase them. If you have problems or questions, call your caregiver or physical therapist for advice.  ° °HOME CARE INSTRUCTIONS  °Remove items at home which could result in a fall. This includes throw rugs or furniture in walking pathways.  °· ICE to the affected knee every three hours for 30 minutes at a time and then as needed for pain and swelling.  Continue to use ice on the knee for pain and swelling from surgery. You may notice swelling that will progress down to the foot and ankle.  This is normal after surgery.  Elevate the leg when you are not up walking on it.   °· Continue to use the breathing machine which will help keep your temperature down.  It is common for your temperature to cycle up and down following surgery, especially at night when you are not up moving around and exerting yourself.  The breathing machine keeps your lungs expanded and your temperature down. °· Do not place pillow under knee, focus on keeping the knee straight while resting ° °DIET °You may resume your previous home diet once your are discharged from the hospital. ° °DRESSING / WOUND CARE / SHOWERING °You may shower 3 days after surgery, but keep the wounds dry during showering.  You may use an occlusive plastic wrap (Press'n Seal for example), NO SOAKING/SUBMERGING IN THE BATHTUB.  If the bandage gets  wet, change with a clean dry gauze.  If the incision gets wet, pat the wound dry with a clean towel. °You may start showering once you are discharged home but do not submerge the incision under water. Just pat the incision dry and apply a dry gauze dressing on daily. °Change the surgical dressing daily and reapply a dry dressing each time. ° °ACTIVITY °Walk with your walker as instructed. °Use walker as long as suggested by your caregivers. °Avoid periods of inactivity such as sitting longer than an hour when not asleep. This helps prevent blood clots.  °You may resume a sexual relationship in one month or when given the OK by your doctor.  °You may return to work once you are cleared by your doctor.  °Do not drive a car for 6 weeks or until released by you surgeon.  °Do not drive while taking narcotics. ° °WEIGHT BEARING °Weight bearing as tolerated with assist device (walker, cane, etc) as directed, use it as long as suggested by your surgeon or therapist, typically at least 4-6 weeks. ° °POSTOPERATIVE CONSTIPATION PROTOCOL °Constipation - defined medically as fewer than three stools per week and severe constipation as less than one stool per week. ° °One of the most common issues patients have following surgery is constipation.  Even if you have a regular bowel pattern at home, your normal regimen is likely to be disrupted due to multiple reasons following surgery.  Combination of anesthesia, postoperative narcotics, change in appetite and fluid intake all can affect your bowels.    In order to avoid complications following surgery, here are some recommendations in order to help you during your recovery period. ° °Colace (docusate) - Pick up an over-the-counter form of Colace or another stool softener and take twice a day as long as you are requiring postoperative pain medications.  Take with a full glass of water daily.  If you experience loose stools or diarrhea, hold the colace until you stool forms back up.  If  your symptoms do not get better within 1 week or if they get worse, check with your doctor. ° °Dulcolax (bisacodyl) - Pick up over-the-counter and take as directed by the product packaging as needed to assist with the movement of your bowels.  Take with a full glass of water.  Use this product as needed if not relieved by Colace only.  ° °MiraLax (polyethylene glycol) - Pick up over-the-counter to have on hand.  MiraLax is a solution that will increase the amount of water in your bowels to assist with bowel movements.  Take as directed and can mix with a glass of water, juice, soda, coffee, or tea.  Take if you go more than two days without a movement. °Do not use MiraLax more than once per day. Call your doctor if you are still constipated or irregular after using this medication for 7 days in a row. ° °If you continue to have problems with postoperative constipation, please contact the office for further assistance and recommendations.  If you experience "the worst abdominal pain ever" or develop nausea or vomiting, please contact the office immediatly for further recommendations for treatment. ° °ITCHING ° If you experience itching with your medications, try taking only a single pain pill, or even half a pain pill at a time.  You can also use Benadryl over the counter for itching or also to help with sleep.  ° °TED HOSE STOCKINGS °Wear the elastic stockings on both legs for three weeks following surgery during the day but you may remove then at night for sleeping. ° °MEDICATIONS °See your medication summary on the “After Visit Summary” that the nursing staff will review with you prior to discharge.  You may have some home medications which will be placed on hold until you complete the course of blood thinner medication.  It is important for you to complete the blood thinner medication as prescribed by your surgeon.  Continue your approved medications as instructed at time of discharge. ° °PRECAUTIONS °If you  experience chest pain or shortness of breath - call 911 immediately for transfer to the hospital emergency department.  °If you develop a fever greater that 101 F, purulent drainage from wound, increased redness or drainage from wound, foul odor from the wound/dressing, or calf pain - CONTACT YOUR SURGEON.   °                                                °FOLLOW-UP APPOINTMENTS °Make sure you keep all of your appointments after your operation with your surgeon and caregivers. You should call the office at the above phone number and make an appointment for approximately two weeks after the date of your surgery or on the date instructed by your surgeon outlined in the "After Visit Summary". ° ° °RANGE OF MOTION AND STRENGTHENING EXERCISES  °Rehabilitation of the knee is important following a knee injury or   an operation. After just a few days of immobilization, the muscles of the thigh which control the knee become weakened and shrink (atrophy). Knee exercises are designed to build up the tone and strength of the thigh muscles and to improve knee motion. Often times heat used for twenty to thirty minutes before working out will loosen up your tissues and help with improving the range of motion but do not use heat for the first two weeks following surgery. These exercises can be done on a training (exercise) mat, on the floor, on a table or on a bed. Use what ever works the best and is most comfortable for you Knee exercises include:  °Leg Lifts - While your knee is still immobilized in a splint or cast, you can do straight leg raises. Lift the leg to 60 degrees, hold for 3 sec, and slowly lower the leg. Repeat 10-20 times 2-3 times daily. Perform this exercise against resistance later as your knee gets better.  °Quad and Hamstring Sets - Tighten up the muscle on the front of the thigh (Quad) and hold for 5-10 sec. Repeat this 10-20 times hourly. Hamstring sets are done by pushing the foot backward against an object  and holding for 5-10 sec. Repeat as with quad sets.  °· Leg Slides: Lying on your back, slowly slide your foot toward your buttocks, bending your knee up off the floor (only go as far as is comfortable). Then slowly slide your foot back down until your leg is flat on the floor again. °· Angel Wings: Lying on your back spread your legs to the side as far apart as you can without causing discomfort.  °A rehabilitation program following serious knee injuries can speed recovery and prevent re-injury in the future due to weakened muscles. Contact your doctor or a physical therapist for more information on knee rehabilitation.  ° °IF YOU ARE TRANSFERRED TO A SKILLED REHAB FACILITY °If the patient is transferred to a skilled rehab facility following release from the hospital, a list of the current medications will be sent to the facility for the patient to continue.  When discharged from the skilled rehab facility, please have the facility set up the patient's Home Health Physical Therapy prior to being released. Also, the skilled facility will be responsible for providing the patient with their medications at time of release from the facility to include their pain medication, the muscle relaxants, and their blood thinner medication. If the patient is still at the rehab facility at time of the two week follow up appointment, the skilled rehab facility will also need to assist the patient in arranging follow up appointment in our office and any transportation needs. ° °MAKE SURE YOU:  °Understand these instructions.  °Get help right away if you are not doing well or get worse.  ° ° °Pick up stool softner and laxative for home use following surgery while on pain medications. °Do not submerge incision under water. °Please use good hand washing techniques while changing dressing each day. °May shower starting three days after surgery. °Please use a clean towel to pat the incision dry following showers. °Continue to use ice for  pain and swelling after surgery. °Do not use any lotions or creams on the incision until instructed by your surgeon. ° °Take Xarelto for two and a half more weeks following discharge from the hospital, then discontinue Xarelto. °Once the patient has completed the blood thinner regimen, then take a Baby 81 mg Aspirin daily for three   more weeks. ° ° ° °Information on my medicine - XARELTO® (Rivaroxaban) ° °Why was Xarelto® prescribed for you? °Xarelto® was prescribed for you to reduce the risk of blood clots forming after orthopedic surgery. The medical term for these abnormal blood clots is venous thromboembolism (VTE). ° °What do you need to know about xarelto® ? °Take your Xarelto® ONCE DAILY at the same time every day. °You may take it either with or without food. ° °If you have difficulty swallowing the tablet whole, you may crush it and mix in applesauce just prior to taking your dose. ° °Take Xarelto® exactly as prescribed by your doctor and DO NOT stop taking Xarelto® without talking to the doctor who prescribed the medication.  Stopping without other VTE prevention medication to take the place of Xarelto® may increase your risk of developing a clot. ° °After discharge, you should have regular check-up appointments with your healthcare provider that is prescribing your Xarelto®.   ° °What do you do if you miss a dose? °If you miss a dose, take it as soon as you remember on the same day then continue your regularly scheduled once daily regimen the next day. Do not take two doses of Xarelto® on the same day.  ° °Important Safety Information °A possible side effect of Xarelto® is bleeding. You should call your healthcare provider right away if you experience any of the following: °? Bleeding from an injury or your nose that does not stop. °? Unusual colored urine (red or dark brown) or unusual colored stools (red or black). °? Unusual bruising for unknown reasons. °? A serious fall or if you hit your head (even  if there is no bleeding). ° °Some medicines may interact with Xarelto® and might increase your risk of bleeding while on Xarelto®. To help avoid this, consult your healthcare provider or pharmacist prior to using any new prescription or non-prescription medications, including herbals, vitamins, non-steroidal anti-inflammatory drugs (NSAIDs) and supplements. ° °This website has more information on Xarelto®: [email or web address].com. ° ° ° °

## 2017-03-12 NOTE — Discharge Summary (Signed)
Physician Discharge Summary   Patient ID: [name] [name] MRN: [id number] DOB/AGE: [birth year] 72 y.o.  Admit date: 03/11/2017 Discharge date: 03-12-2017  Primary Diagnosis:  Osteoarthritis Right knee(s)   Admission Diagnoses:  Past Medical History:  Diagnosis Date  . DIVERTICULOSIS, COLON 09/10/2007  . DM 03/26/2008  . Glaucoma 03/17/2011  . HYPERLIPIDEMIA 09/10/2007  . HYPERTENSION 09/10/2007  . Hypertension   . Osteoarthritis 10/21/2011  . Shingles 03/17/2011   Discharge Diagnoses:   Active Problems:   OA (osteoarthritis) of knee  Estimated body mass index is 33.61 kg/m as calculated from the following:   Height as of this encounter: '5\' 5"'  (1.651 m).   Weight as of this encounter: 91.6 kg (202 lb).  Procedure:  Procedure(s) (LRB): RIGHT TOTAL KNEE ARTHROPLASTY (Right)   Consults: None  HPI: AZLYNN MITNICK is a 72 y.o. year old female with end stage OA of her right knee with progressively worsening pain and dysfunction. She has constant pain, with activity and at rest and significant functional deficits with difficulties even with ADLs. She has had extensive non-op management including analgesics, injections of cortisone and viscosupplements, and home exercise program, but remains in significant pain with significant dysfunction.Radiographs show bone on bone arthritis lateral and patellofemoral. She presents now for right Total Knee Arthroplasty.     Laboratory Data: Admission on 03/11/2017  Component Date Value Ref Range Status  . Glucose-Capillary 03/11/2017 157* 65 - 99 mg/dL Final  . Glucose-Capillary 03/11/2017 154* 65 - 99 mg/dL Final  . Glucose-Capillary 03/11/2017 155* 65 - 99 mg/dL Final  . Glucose-Capillary 03/11/2017 180* 65 - 99 mg/dL Final  . Sodium 03/12/2017 140  135 - 145 mmol/L Final  . Potassium 03/12/2017 4.9  3.5 - 5.1 mmol/L Final  . Chloride 03/12/2017 108  101 - 111 mmol/L Final  . CO2 03/12/2017 22  22 - 32 mmol/L Final  . Glucose, Bld 03/12/2017 133* 65 -  99 mg/dL Final  . BUN 03/12/2017 20  6 - 20 mg/dL Final  . Creatinine, Ser 03/12/2017 0.67  0.44 - 1.00 mg/dL Final  . Calcium 03/12/2017 8.7* 8.9 - 10.3 mg/dL Final  . GFR calc non Af Amer 03/12/2017 >60  >60 mL/min Final  . GFR calc Af Amer 03/12/2017 >60  >60 mL/min Final   Comment: (NOTE) The eGFR has been calculated using the CKD EPI equation. This calculation has not been validated in all clinical situations. eGFR's persistently <60 mL/min signify possible Chronic Kidney Disease.   Georgiann Hahn gap 03/12/2017 10  5 - 15 Final   Performed at [facility], [place] [address] [place], [place] [address]  . Glucose-Capillary 03/11/2017 184* 65 - 99 mg/dL Final  . WBC 03/12/2017 15.9* 4.0 - 10.5 K/uL Final  . RBC 03/12/2017 4.10  3.87 - 5.11 MIL/uL Final  . Hemoglobin 03/12/2017 12.6  12.0 - 15.0 g/dL Final  . HCT 03/12/2017 38.0  36.0 - 46.0 % Final  . MCV 03/12/2017 92.7  78.0 - 100.0 fL Final  . MCH 03/12/2017 30.7  26.0 - 34.0 pg Final  . MCHC 03/12/2017 33.2  30.0 - 36.0 g/dL Final  . RDW 03/12/2017 12.8  11.5 - 15.5 % Final  . Platelets 03/12/2017 267  150 - 400 K/uL Final   Performed at [facility], [place] [address] [place], [place] [address]  . Glucose-Capillary 03/12/2017 159* 65 - 99 mg/dL Final  . Comment 1 03/12/2017 Notify RN   Final  . Comment 2 03/12/2017  Document in Chart   Final  Hospital Outpatient Visit on 03/06/2017  Component Date Value Ref Range Status  . Glucose-Capillary 03/06/2017 169* 65 - 99 mg/dL Final  . Hgb A1c MFr Bld 03/06/2017 6.9* 4.8 - 5.6 % Final   Comment: (NOTE) Pre diabetes:          5.7%-6.4% Diabetes:              >6.4% Glycemic control for   <7.0% adults with diabetes   . Mean Plasma Glucose 03/06/2017 151.33  mg/dL Final   Performed at [facility] [address], [place], [place] [address]  . aPTT 03/06/2017 25  24 - 36 seconds Final  . WBC 03/06/2017 7.6  4.0 - 10.5 K/uL Final  . RBC 03/06/2017  4.85  3.87 - 5.11 MIL/uL Final  . Hemoglobin 03/06/2017 14.9  12.0 - 15.0 g/dL Final  . HCT 03/06/2017 44.7  36.0 - 46.0 % Final  . MCV 03/06/2017 92.2  78.0 - 100.0 fL Final  . MCH 03/06/2017 30.7  26.0 - 34.0 pg Final  . MCHC 03/06/2017 33.3  30.0 - 36.0 g/dL Final  . RDW 03/06/2017 12.7  11.5 - 15.5 % Final  . Platelets 03/06/2017 266  150 - 400 K/uL Final  . Sodium 03/06/2017 139  135 - 145 mmol/L Final  . Potassium 03/06/2017 4.5  3.5 - 5.1 mmol/L Final  . Chloride 03/06/2017 107  101 - 111 mmol/L Final  . CO2 03/06/2017 24  22 - 32 mmol/L Final  . Glucose, Bld 03/06/2017 161* 65 - 99 mg/dL Final  . BUN 03/06/2017 24* 6 - 20 mg/dL Final  . Creatinine, Ser 03/06/2017 0.75  0.44 - 1.00 mg/dL Final  . Calcium 03/06/2017 9.3  8.9 - 10.3 mg/dL Final  . Total Protein 03/06/2017 7.3  6.5 - 8.1 g/dL Final  . Albumin 03/06/2017 4.6  3.5 - 5.0 g/dL Final  . AST 03/06/2017 28  15 - 41 U/L Final  . ALT 03/06/2017 20  14 - 54 U/L Final  . Alkaline Phosphatase 03/06/2017 62  38 - 126 U/L Final  . Total Bilirubin 03/06/2017 1.3* 0.3 - 1.2 mg/dL Final  . GFR calc non Af Amer 03/06/2017 >60  >60 mL/min Final  . GFR calc Af Amer 03/06/2017 >60  >60 mL/min Final   Comment: (NOTE) The eGFR has been calculated using the CKD EPI equation. This calculation has not been validated in all clinical situations. eGFR's persistently <60 mL/min signify possible Chronic Kidney Disease.   . Anion gap 03/06/2017 8  5 - 15 Final  . Prothrombin Time 03/06/2017 12.3  11.4 - 15.2 seconds Final  . INR 03/06/2017 0.93   Final  . ABO/RH(D) 03/06/2017 O POS   Final  . Antibody Screen 03/06/2017 NEG   Final  . Sample Expiration 03/06/2017 03/14/2017   Final  . Extend sample reason 03/06/2017    Final                   Value:NO TRANSFUSIONS OR PREGNANCY IN THE PAST 3 MONTHS Performed at [facility], [place] [address] [place], [place] [address]   . MRSA, PCR 03/06/2017 NEGATIVE  NEGATIVE Final  .  Staphylococcus aureus 03/06/2017 NEGATIVE  NEGATIVE Final   Comment: (NOTE) The Xpert SA Assay (FDA approved for NASAL specimens in patients 69 years of age and older), is one component of a comprehensive surveillance program. It is not intended to diagnose infection nor to  guide or monitor treatment.      X-Rays:No results found.  EKG: Orders placed or performed during the hospital encounter of 09/19/16  . EKG 12 lead  . EKG 12 lead     Hospital Course: PAYETON GERMANI is a 72 y.o. who was admitted to [facility]. They were brought to the operating room on 03/11/2017 and underwent Procedure(s): RIGHT TOTAL KNEE ARTHROPLASTY.  Patient tolerated the procedure well and was later transferred to the recovery room and then to the orthopaedic floor for postoperative care.  They were given PO and IV analgesics for pain control following their surgery.  They were given 24 hours of postoperative antibiotics of  Anti-infectives (From admission, onward)   Start     Dose/Rate Route Frequency Ordered Stop   03/11/17 1400  ceFAZolin (ANCEF) IVPB 2g/100 mL premix     2 g 200 mL/hr over 30 Minutes Intravenous Every 6 hours 03/11/17 1012 03/11/17 2045   03/11/17 0618  ceFAZolin (ANCEF) IVPB 2g/100 mL premix     2 g 200 mL/hr over 30 Minutes Intravenous On call to O.R. 03/11/17 3568 03/11/17 0717     and started on DVT prophylaxis in the form of Xarelto.   PT and OT were ordered for total joint protocol.  Discharge planning consulted to help with postop disposition and equipment needs.  Patient had a good night on the evening of surgery.  They started to get up OOB with therapy on day one and worked with PT twice. Hemovac drain was pulled without difficulty.  Dressing was checked and was intact.  Patient was seen in rounds on day one, worked with therapy, setup and was ready to go home later on day one.   Diet - Cardiac diet and Diabetic diet Follow up - in 2 weeks Activity - WBAT Disposition -  Home Condition Upon Discharge - stable D/C Meds - See DC Summary DVT Prophylaxis - Xarelto     Discharge Instructions    Call MD / Call 911   Complete by:  As directed    If you experience chest pain or shortness of breath, CALL 911 and be transported to the hospital emergency room.  If you develope a fever above 101 F, pus (white drainage) or increased drainage or redness at the wound, or calf pain, call your surgeon's office.   Change dressing   Complete by:  As directed    Change dressing daily with sterile 4 x 4 inch gauze dressing and apply TED hose. Do not submerge the incision under water.   Constipation Prevention   Complete by:  As directed    Drink plenty of fluids.  Prune juice may be helpful.  You may use a stool softener, such as Colace (over the counter) 100 mg twice a day.  Use MiraLax (over the counter) for constipation as needed.   Diet - low sodium heart healthy   Complete by:  As directed    Discharge instructions   Complete by:  As directed    Take Xarelto for two and a half more weeks, then discontinue Xarelto. Once the patient has completed the blood thinner regimen, then take a Baby 81 mg Aspirin daily for three more weeks.   Pick up stool softner and laxative for home use following surgery while on pain medications. Do not submerge incision under water. Please use good hand washing techniques while changing dressing each day. May shower starting three days after surgery. Please use a clean towel  to pat the incision dry following showers. Continue to use ice for pain and swelling after surgery. Do not use any lotions or creams on the incision until instructed by your surgeon.  Wear both TED hose on both legs during the day every day for three weeks, but may remove the TED hose at night at home.  Postoperative Constipation Protocol  Constipation - defined medically as fewer than three stools per week and severe constipation as less than one stool per  week.  One of the most common issues patients have following surgery is constipation.  Even if you have a regular bowel pattern at home, your normal regimen is likely to be disrupted due to multiple reasons following surgery.  Combination of anesthesia, postoperative narcotics, change in appetite and fluid intake all can affect your bowels.  In order to avoid complications following surgery, here are some recommendations in order to help you during your recovery period.  Colace (docusate) - Pick up an over-the-counter form of Colace or another stool softener and take twice a day as long as you are requiring postoperative pain medications.  Take with a full glass of water daily.  If you experience loose stools or diarrhea, hold the colace until you stool forms back up.  If your symptoms do not get better within 1 week or if they get worse, check with your doctor.  Dulcolax (bisacodyl) - Pick up over-the-counter and take as directed by the product packaging as needed to assist with the movement of your bowels.  Take with a full glass of water.  Use this product as needed if not relieved by Colace only.   MiraLax (polyethylene glycol) - Pick up over-the-counter to have on hand.  MiraLax is a solution that will increase the amount of water in your bowels to assist with bowel movements.  Take as directed and can mix with a glass of water, juice, soda, coffee, or tea.  Take if you go more than two days without a movement. Do not use MiraLax more than once per day. Call your doctor if you are still constipated or irregular after using this medication for 7 days in a row.  If you continue to have problems with postoperative constipation, please contact the office for further assistance and recommendations.  If you experience "the worst abdominal pain ever" or develop nausea or vomiting, please contact the office immediatly for further recommendations for treatment.   Do not put a pillow under the knee. Place it  under the heel.   Complete by:  As directed    Do not sit on low chairs, stoools or toilet seats, as it may be difficult to get up from low surfaces   Complete by:  As directed    Driving restrictions   Complete by:  As directed    No driving until released by the physician.   Increase activity slowly as tolerated   Complete by:  As directed    Lifting restrictions   Complete by:  As directed    No lifting until released by the physician.   Patient may shower   Complete by:  As directed    You may shower without a dressing once there is no drainage.  Do not wash over the wound.  If drainage remains, do not shower until drainage stops.   TED hose   Complete by:  As directed    Use stockings (TED hose) for 3 weeks on both leg(s).  You may remove them at  night for sleeping.   Weight bearing as tolerated   Complete by:  As directed      Allergies as of 03/12/2017      Reactions   Ivp Dye [iodinated Diagnostic Agents]    Sulfamethoxazole    REACTION: unspecified      Medication List    STOP taking these medications   multivitamin with minerals tablet     TAKE these medications   acetaminophen 500 MG tablet Commonly known as:  TYLENOL Take 1,000 mg by mouth every 8 (eight) hours as needed for mild pain or moderate pain.   glucose blood test strip Commonly known as:  ONE TOUCH ULTRA TEST Use as instructed three times per day  E11.9   Lancet Device Misc Use as directed three times daily E11.9   losartan 100 MG tablet Commonly known as:  COZAAR TAKE ONE TABLET BY MOUTH DAILY   LUMIGAN 0.01 % Soln Generic drug:  bimatoprost Place 1 drop into both eyes at bedtime.   metFORMIN 500 MG tablet Commonly known as:  GLUCOPHAGE TAKE ONE TABLET BY MOUTH TWO TIMES A DAY WITH A MEAL   methocarbamol 500 MG tablet Commonly known as:  ROBAXIN Take 1 tablet (500 mg total) by mouth every 6 (six) hours as needed for muscle spasms.   ONE TOUCH ULTRA 2 w/Device Kit Use as directed   E11.9   oxyCODONE 5 MG immediate release tablet Commonly known as:  Oxy IR/ROXICODONE Take 1 tablet (5 mg total) by mouth every 4 (four) hours as needed for moderate pain or severe pain.   rivaroxaban 10 MG Tabs tablet Commonly known as:  XARELTO Take 1 tablet (10 mg total) by mouth daily with breakfast. Take Xarelto for two and a half more weeks following discharge from the hospital, then discontinue Xarelto. Once the patient has completed the blood thinner regimen, then take a Baby 81 mg Aspirin daily for three more weeks. Start taking on:  03/13/2017   simvastatin 40 MG tablet Commonly known as:  ZOCOR TAKE ONE TABLET BY MOUTH DAILY   traMADol 50 MG tablet Commonly known as:  ULTRAM Take 1-2 tablets (50-100 mg total) by mouth every 6 (six) hours as needed (mild pain).            Discharge Care Instructions  (From admission, onward)        Start     Ordered   03/12/17 0000  Weight bearing as tolerated     03/12/17 0948   03/12/17 0000  Change dressing    Comments:  Change dressing daily with sterile 4 x 4 inch gauze dressing and apply TED hose. Do not submerge the incision under water.   03/12/17 5379     Follow-up Information    Gaynelle Arabian, MD. Schedule an appointment as soon as possible for a visit on 03/26/2017.   Specialty:  Orthopedic Surgery Contact information: [address] [place] [address] [phone]           Signed: Arlee Muslim, PA-C Orthopaedic Surgery 03/12/2017, 9:49 AM

## 2017-03-13 ENCOUNTER — Telehealth: Payer: Self-pay | Admitting: *Deleted

## 2017-03-13 NOTE — Telephone Encounter (Signed)
1 Pt was on TCM list admitted 03/11/17 for Osteoarthritis right knee. Pt had total right knee arthroplasty, and D/c 03/12/17 will Schedule an appointment with Gaynelle Arabian, MD (Orthopedic Surgery) on 03/26/2017.Marland KitchenJohny Chess

## 2017-03-14 DIAGNOSIS — Z7984 Long term (current) use of oral hypoglycemic drugs: Secondary | ICD-10-CM | POA: Diagnosis not present

## 2017-03-14 DIAGNOSIS — Z7901 Long term (current) use of anticoagulants: Secondary | ICD-10-CM | POA: Diagnosis not present

## 2017-03-14 DIAGNOSIS — Z96651 Presence of right artificial knee joint: Secondary | ICD-10-CM | POA: Diagnosis not present

## 2017-03-14 DIAGNOSIS — K5730 Diverticulosis of large intestine without perforation or abscess without bleeding: Secondary | ICD-10-CM | POA: Diagnosis not present

## 2017-03-14 DIAGNOSIS — H409 Unspecified glaucoma: Secondary | ICD-10-CM | POA: Diagnosis not present

## 2017-03-14 DIAGNOSIS — Z96652 Presence of left artificial knee joint: Secondary | ICD-10-CM | POA: Diagnosis not present

## 2017-03-14 DIAGNOSIS — Z79891 Long term (current) use of opiate analgesic: Secondary | ICD-10-CM | POA: Diagnosis not present

## 2017-03-14 DIAGNOSIS — H905 Unspecified sensorineural hearing loss: Secondary | ICD-10-CM | POA: Diagnosis not present

## 2017-03-14 DIAGNOSIS — I10 Essential (primary) hypertension: Secondary | ICD-10-CM | POA: Diagnosis not present

## 2017-03-14 DIAGNOSIS — Z9181 History of falling: Secondary | ICD-10-CM | POA: Diagnosis not present

## 2017-03-14 DIAGNOSIS — N959 Unspecified menopausal and perimenopausal disorder: Secondary | ICD-10-CM | POA: Diagnosis not present

## 2017-03-14 DIAGNOSIS — E119 Type 2 diabetes mellitus without complications: Secondary | ICD-10-CM | POA: Diagnosis not present

## 2017-03-14 DIAGNOSIS — Z471 Aftercare following joint replacement surgery: Secondary | ICD-10-CM | POA: Diagnosis not present

## 2017-03-15 ENCOUNTER — Other Ambulatory Visit: Payer: Self-pay | Admitting: Internal Medicine

## 2017-03-18 DIAGNOSIS — Z9181 History of falling: Secondary | ICD-10-CM | POA: Diagnosis not present

## 2017-03-18 DIAGNOSIS — E119 Type 2 diabetes mellitus without complications: Secondary | ICD-10-CM | POA: Diagnosis not present

## 2017-03-18 DIAGNOSIS — Z96651 Presence of right artificial knee joint: Secondary | ICD-10-CM | POA: Diagnosis not present

## 2017-03-18 DIAGNOSIS — Z7984 Long term (current) use of oral hypoglycemic drugs: Secondary | ICD-10-CM | POA: Diagnosis not present

## 2017-03-18 DIAGNOSIS — K5730 Diverticulosis of large intestine without perforation or abscess without bleeding: Secondary | ICD-10-CM | POA: Diagnosis not present

## 2017-03-18 DIAGNOSIS — H905 Unspecified sensorineural hearing loss: Secondary | ICD-10-CM | POA: Diagnosis not present

## 2017-03-18 DIAGNOSIS — H409 Unspecified glaucoma: Secondary | ICD-10-CM | POA: Diagnosis not present

## 2017-03-18 DIAGNOSIS — N959 Unspecified menopausal and perimenopausal disorder: Secondary | ICD-10-CM | POA: Diagnosis not present

## 2017-03-18 DIAGNOSIS — Z79891 Long term (current) use of opiate analgesic: Secondary | ICD-10-CM | POA: Diagnosis not present

## 2017-03-18 DIAGNOSIS — I10 Essential (primary) hypertension: Secondary | ICD-10-CM | POA: Diagnosis not present

## 2017-03-18 DIAGNOSIS — Z471 Aftercare following joint replacement surgery: Secondary | ICD-10-CM | POA: Diagnosis not present

## 2017-03-18 DIAGNOSIS — Z7901 Long term (current) use of anticoagulants: Secondary | ICD-10-CM | POA: Diagnosis not present

## 2017-03-18 DIAGNOSIS — Z96652 Presence of left artificial knee joint: Secondary | ICD-10-CM | POA: Diagnosis not present

## 2017-03-26 DIAGNOSIS — M25661 Stiffness of right knee, not elsewhere classified: Secondary | ICD-10-CM | POA: Diagnosis not present

## 2017-03-27 DIAGNOSIS — M25661 Stiffness of right knee, not elsewhere classified: Secondary | ICD-10-CM | POA: Diagnosis not present

## 2017-04-03 DIAGNOSIS — M25661 Stiffness of right knee, not elsewhere classified: Secondary | ICD-10-CM | POA: Diagnosis not present

## 2017-04-05 DIAGNOSIS — M25661 Stiffness of right knee, not elsewhere classified: Secondary | ICD-10-CM | POA: Diagnosis not present

## 2017-04-08 DIAGNOSIS — I10 Essential (primary) hypertension: Secondary | ICD-10-CM | POA: Diagnosis not present

## 2017-04-08 DIAGNOSIS — E119 Type 2 diabetes mellitus without complications: Secondary | ICD-10-CM | POA: Diagnosis not present

## 2017-04-08 DIAGNOSIS — M25661 Stiffness of right knee, not elsewhere classified: Secondary | ICD-10-CM | POA: Diagnosis not present

## 2017-04-08 DIAGNOSIS — M1711 Unilateral primary osteoarthritis, right knee: Secondary | ICD-10-CM | POA: Diagnosis not present

## 2017-04-08 DIAGNOSIS — G8918 Other acute postprocedural pain: Secondary | ICD-10-CM | POA: Diagnosis not present

## 2017-04-10 DIAGNOSIS — M25661 Stiffness of right knee, not elsewhere classified: Secondary | ICD-10-CM | POA: Diagnosis not present

## 2017-04-12 DIAGNOSIS — M25661 Stiffness of right knee, not elsewhere classified: Secondary | ICD-10-CM | POA: Diagnosis not present

## 2017-04-15 DIAGNOSIS — Z96651 Presence of right artificial knee joint: Secondary | ICD-10-CM | POA: Diagnosis not present

## 2017-04-15 DIAGNOSIS — Z96652 Presence of left artificial knee joint: Secondary | ICD-10-CM | POA: Diagnosis not present

## 2017-04-15 DIAGNOSIS — Z471 Aftercare following joint replacement surgery: Secondary | ICD-10-CM | POA: Diagnosis not present

## 2017-04-16 DIAGNOSIS — M25661 Stiffness of right knee, not elsewhere classified: Secondary | ICD-10-CM | POA: Diagnosis not present

## 2017-04-18 DIAGNOSIS — M25661 Stiffness of right knee, not elsewhere classified: Secondary | ICD-10-CM | POA: Diagnosis not present

## 2017-04-22 DIAGNOSIS — Z01818 Encounter for other preprocedural examination: Secondary | ICD-10-CM | POA: Diagnosis not present

## 2017-04-22 DIAGNOSIS — H401131 Primary open-angle glaucoma, bilateral, mild stage: Secondary | ICD-10-CM | POA: Diagnosis not present

## 2017-04-22 DIAGNOSIS — H2512 Age-related nuclear cataract, left eye: Secondary | ICD-10-CM | POA: Diagnosis not present

## 2017-04-22 DIAGNOSIS — H2511 Age-related nuclear cataract, right eye: Secondary | ICD-10-CM | POA: Diagnosis not present

## 2017-04-22 DIAGNOSIS — H04123 Dry eye syndrome of bilateral lacrimal glands: Secondary | ICD-10-CM | POA: Diagnosis not present

## 2017-04-23 DIAGNOSIS — M25661 Stiffness of right knee, not elsewhere classified: Secondary | ICD-10-CM | POA: Diagnosis not present

## 2017-04-25 DIAGNOSIS — M25661 Stiffness of right knee, not elsewhere classified: Secondary | ICD-10-CM | POA: Diagnosis not present

## 2017-05-02 DIAGNOSIS — H401121 Primary open-angle glaucoma, left eye, mild stage: Secondary | ICD-10-CM | POA: Diagnosis not present

## 2017-05-02 DIAGNOSIS — H401120 Primary open-angle glaucoma, left eye, stage unspecified: Secondary | ICD-10-CM | POA: Diagnosis not present

## 2017-05-02 DIAGNOSIS — H2512 Age-related nuclear cataract, left eye: Secondary | ICD-10-CM | POA: Diagnosis not present

## 2017-05-02 DIAGNOSIS — H409 Unspecified glaucoma: Secondary | ICD-10-CM | POA: Diagnosis not present

## 2017-05-02 DIAGNOSIS — H25812 Combined forms of age-related cataract, left eye: Secondary | ICD-10-CM | POA: Diagnosis not present

## 2017-05-07 ENCOUNTER — Other Ambulatory Visit (INDEPENDENT_AMBULATORY_CARE_PROVIDER_SITE_OTHER): Payer: PPO

## 2017-05-07 DIAGNOSIS — E119 Type 2 diabetes mellitus without complications: Secondary | ICD-10-CM

## 2017-05-07 LAB — CBC WITH DIFFERENTIAL/PLATELET
BASOS ABS: 0.1 10*3/uL (ref 0.0–0.1)
Basophils Relative: 1 % (ref 0.0–3.0)
EOS ABS: 0.3 10*3/uL (ref 0.0–0.7)
EOS PCT: 3.5 % (ref 0.0–5.0)
HCT: 45.5 % (ref 36.0–46.0)
Hemoglobin: 14.8 g/dL (ref 12.0–15.0)
LYMPHS ABS: 1.8 10*3/uL (ref 0.7–4.0)
Lymphocytes Relative: 22.6 % (ref 12.0–46.0)
MCHC: 32.6 g/dL (ref 30.0–36.0)
MCV: 90.8 fl (ref 78.0–100.0)
MONO ABS: 0.6 10*3/uL (ref 0.1–1.0)
Monocytes Relative: 6.9 % (ref 3.0–12.0)
NEUTROS PCT: 66 % (ref 43.0–77.0)
Neutro Abs: 5.3 10*3/uL (ref 1.4–7.7)
Platelets: 291 10*3/uL (ref 150.0–400.0)
RBC: 5.01 Mil/uL (ref 3.87–5.11)
RDW: 13.8 % (ref 11.5–15.5)
WBC: 8 10*3/uL (ref 4.0–10.5)

## 2017-05-07 LAB — URINALYSIS, ROUTINE W REFLEX MICROSCOPIC
Bilirubin Urine: NEGATIVE
Hgb urine dipstick: NEGATIVE
Ketones, ur: NEGATIVE
Nitrite: NEGATIVE
PH: 6 (ref 5.0–8.0)
RBC / HPF: NONE SEEN (ref 0–?)
SPECIFIC GRAVITY, URINE: 1.015 (ref 1.000–1.030)
Total Protein, Urine: NEGATIVE
Urine Glucose: NEGATIVE
Urobilinogen, UA: 0.2 (ref 0.0–1.0)

## 2017-05-07 LAB — BASIC METABOLIC PANEL
BUN: 29 mg/dL — AB (ref 6–23)
CO2: 25 meq/L (ref 19–32)
Calcium: 9.4 mg/dL (ref 8.4–10.5)
Chloride: 102 mEq/L (ref 96–112)
Creatinine, Ser: 0.68 mg/dL (ref 0.40–1.20)
GFR: 90.04 mL/min (ref >60.00)
GLUCOSE: 152 mg/dL — AB (ref 70–99)
POTASSIUM: 4.9 meq/L (ref 3.5–5.1)
SODIUM: 137 meq/L (ref 135–145)

## 2017-05-07 LAB — LIPID PANEL
CHOL/HDL RATIO: 3
Cholesterol: 123 mg/dL (ref 0–200)
HDL: 46.1 mg/dL (ref >39.00)
LDL Cholesterol: 52 mg/dL (ref 0–99)
NONHDL: 76.64
Triglycerides: 123 mg/dL (ref 0.0–149.0)
VLDL: 24.6 mg/dL (ref 0.0–40.0)

## 2017-05-07 LAB — HEPATIC FUNCTION PANEL
ALT: 16 U/L (ref 0–35)
AST: 20 U/L (ref 0–37)
Albumin: 4.1 g/dL (ref 3.5–5.2)
Alkaline Phosphatase: 76 U/L (ref 39–117)
BILIRUBIN DIRECT: 0.2 mg/dL (ref 0.0–0.3)
BILIRUBIN TOTAL: 0.8 mg/dL (ref 0.2–1.2)
Total Protein: 7.1 g/dL (ref 6.0–8.3)

## 2017-05-07 LAB — MICROALBUMIN / CREATININE URINE RATIO
CREATININE, U: 58.4 mg/dL
MICROALB UR: 1.3 mg/dL (ref 0.0–1.9)
MICROALB/CREAT RATIO: 2.2 mg/g (ref 0.0–30.0)

## 2017-05-07 LAB — HEMOGLOBIN A1C: HEMOGLOBIN A1C: 6 % (ref 4.6–6.5)

## 2017-05-07 LAB — TSH: TSH: 2.55 u[IU]/mL (ref 0.35–4.50)

## 2017-05-09 ENCOUNTER — Ambulatory Visit (INDEPENDENT_AMBULATORY_CARE_PROVIDER_SITE_OTHER): Payer: PPO | Admitting: Internal Medicine

## 2017-05-09 ENCOUNTER — Encounter: Payer: Self-pay | Admitting: Internal Medicine

## 2017-05-09 ENCOUNTER — Telehealth: Payer: Self-pay | Admitting: Internal Medicine

## 2017-05-09 VITALS — BP 126/88 | HR 120 | Temp 98.7°F | Ht 65.0 in | Wt 195.0 lb

## 2017-05-09 DIAGNOSIS — Z0000 Encounter for general adult medical examination without abnormal findings: Secondary | ICD-10-CM | POA: Diagnosis not present

## 2017-05-09 DIAGNOSIS — R05 Cough: Secondary | ICD-10-CM | POA: Diagnosis not present

## 2017-05-09 DIAGNOSIS — E119 Type 2 diabetes mellitus without complications: Secondary | ICD-10-CM | POA: Diagnosis not present

## 2017-05-09 DIAGNOSIS — R059 Cough, unspecified: Secondary | ICD-10-CM

## 2017-05-09 MED ORDER — METFORMIN HCL 500 MG PO TABS: ORAL_TABLET | ORAL | 2 refills | Status: DC

## 2017-05-09 MED ORDER — AZITHROMYCIN 250 MG PO TABS: ORAL_TABLET | ORAL | 1 refills | Status: DC

## 2017-05-09 MED ORDER — HYDROCODONE-HOMATROPINE 5-1.5 MG/5ML PO SYRP: 5.0000 mL | ORAL_SOLUTION | Freq: Four times a day (QID) | ORAL | 0 refills | Status: DC | PRN

## 2017-05-09 MED ORDER — LOSARTAN POTASSIUM 100 MG PO TABS: 100.0000 mg | ORAL_TABLET | Freq: Every day | ORAL | 3 refills | Status: DC

## 2017-05-09 NOTE — Telephone Encounter (Signed)
Copied from [place]. Topic: Quick Communication - Rx Refill/Question >> May 09, 2017  3:46 PM Boyd Kerbs wrote:  Medication: azithromycin (ZITHROMAX Z-PAK) 250 MG tablet And cough medicine   Has the patient contacted their pharmacy? Yes.   [employer] phone has been down.  Needs to call into [facility] on [address]  (Agent: If no, request that the patient contact the pharmacy for the refill.) Preferred Pharmacy (with phone number or street name):  [place].  [place], [place]  Agent: Please be advised that RX refills may take up to 3 business days. We ask that you follow-up with your pharmacy.

## 2017-05-09 NOTE — Progress Notes (Signed)
Subjective:    Patient ID: [name] [name], female    DOB: 1946/01/09, 72 y.o.   MRN: [id number]  HPI  Here for wellness and f/u;  Overall doing ok;  Pt denies Chest pain, worsening SOB, DOE, wheezing, orthopnea, PND, worsening LE edema, palpitations, dizziness or syncope.  Pt denies neurological change such as new headache, facial or extremity weakness.  Pt denies polydipsia, polyuria, or low sugar symptoms. Pt states overall good compliance with treatment and medications, good tolerability, and has been trying to follow appropriate diet.  Pt denies worsening depressive symptoms, suicidal ideation or panic. No fever, night sweats, wt loss, loss of appetite, or other constitutional symptoms.  Pt states good ability with ADL's, has low fall risk, home safety reviewed and adequate, no other significant changes in hearing or vision, and not active with exercise except Here with acute onset mild to mod 2-3 days ST, HA, general weakness and malaise, with prod cough greenish sputum Wt Readings from Last 3 Encounters:  05/09/17 195 lb (88.5 kg)  03/11/17 202 lb (91.6 kg)  03/06/17 202 lb (91.6 kg)   BP Readings from Last 3 Encounters:  05/09/17 126/88  03/12/17 (!) 124/56  03/06/17 (!) 153/71  s/p bilat TKR in last 6 mo and left cataract.  No other interval hx or new complaint Past Medical History:  Diagnosis Date  . DIVERTICULOSIS, COLON 09/10/2007  . DM 03/26/2008  . Glaucoma 03/17/2011  . HYPERLIPIDEMIA 09/10/2007  . HYPERTENSION 09/10/2007  . Hypertension   . Osteoarthritis 10/21/2011  . Shingles 03/17/2011   Past Surgical History:  Procedure Laterality Date  . ABDOMINAL HYSTERECTOMY    . HEMATOMA EVACUATION     2003; abdominal GI bleed 2nd-ary to Aspirin consumption  . 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 TOTAL KNEE ARTHROPLASTY Right  03/11/2017   Procedure: RIGHT TOTAL KNEE ARTHROPLASTY;  Surgeon: Gaynelle Arabian, MD;  Location: [facility];  Service: Orthopedics;  Laterality: Right;  . TUBAL LIGATION      reports that she has never smoked. She has never used smokeless tobacco. She reports that she drinks alcohol. She reports that she does not use drugs. family history includes Hypertension in her father and mother. Allergies  Allergen Reactions  . Ivp Dye [Iodinated Diagnostic Agents]   . Sulfamethoxazole     REACTION: unspecified   Current Outpatient Medications on File Prior to Visit  Medication Sig Dispense Refill  . acetaminophen (TYLENOL) 500 MG tablet Take 1,000 mg by mouth every 8 (eight) hours as needed for mild pain or moderate pain.    . bimatoprost (LUMIGAN) 0.01 % SOLN Place 1 drop into both eyes at bedtime.     . Blood Glucose Monitoring Suppl (ONE TOUCH ULTRA 2) w/Device KIT Use as directed  E11.9 1 each 0  . glucose blood (ONE TOUCH ULTRA TEST) test strip Use as instructed three times per day  E11.9 300 each 3  . Lancet Device MISC Use as directed three times daily E11.9 300 each 3  . methocarbamol (ROBAXIN) 500 MG tablet Take 1 tablet (500 mg total) by mouth every 6 (six) hours as needed for muscle spasms. 60 tablet 0  . oxyCODONE (OXY IR/ROXICODONE) 5 MG immediate release tablet Take 1 tablet (5 mg total) by mouth every 4 (four) hours as needed for moderate pain or severe pain. [place]  tablet 0  . rivaroxaban (XARELTO) 10 MG TABS tablet Take 1 tablet (10 mg total) by mouth daily with breakfast. Take Xarelto for two and a half more weeks following discharge from the hospital, then discontinue Xarelto. Once the patient has completed the blood thinner regimen, then take a Baby 81 mg Aspirin daily for three more weeks. 20 tablet 0  . simvastatin (ZOCOR) 40 MG tablet TAKE ONE TABLET BY MOUTH DAILY 90 tablet 3  . traMADol (ULTRAM) 50 MG tablet Take 1-2 tablets (50-100 mg total) by mouth every 6 (six) hours as needed (mild  pain). 56 tablet 0   No current facility-administered medications on file prior to visit.    Review of Systems Constitutional: Negative for other unusual diaphoresis, sweats, appetite or weight changes HENT: Negative for other worsening hearing loss, ear pain, facial swelling, mouth sores or neck stiffness.   Eyes: Negative for other worsening pain, redness or other visual disturbance.  Respiratory: Negative for other stridor or swelling Cardiovascular: Negative for other palpitations or other chest pain  Gastrointestinal: Negative for worsening diarrhea or loose stools, blood in stool, distention or other pain Genitourinary: Negative for hematuria, flank pain or other change in urine volume.  Musculoskeletal: Negative for myalgias or other joint swelling.  Skin: Negative for other color change, or other wound or worsening drainage.  Neurological: Negative for other syncope or numbness. Hematological: Negative for other adenopathy or swelling Psychiatric/Behavioral: Negative for hallucinations, other worsening agitation, SI, self-injury, or new decreased concentration All other system neg per pt    Objective:   Physical Exam BP 126/88   Pulse (!) 120   Temp 98.7 F (37.1 C) (Oral)   Ht '5\' 5"'  (1.651 m)   Wt 195 lb (88.5 kg)   SpO2 96%   BMI 32.45 kg/m  VS noted,  Constitutional: Pt is oriented to person, place, and time. Appears well-developed and well-nourished, in no significant distress and comfortable Head: Normocephalic and atraumatic  Eyes: Conjunctivae and EOM are normal. Pupils are equal, round, and reactive to light Right Ear: External ear normal without discharge Left Ear: External ear normal without discharge Nose: Nose without discharge or deformity Mouth/Throat: Oropharynx is without other ulcerations and moist  Neck: Normal range of motion. Neck supple. No JVD present. No tracheal deviation present or significant neck LA or mass Cardiovascular: Normal rate, regular  rhythm, normal heart sounds and intact distal pulses.   Pulmonary/Chest: WOB normal and breath sounds without rales or wheezing  Abdominal: Soft. Bowel sounds are normal. NT. No HSM  Musculoskeletal: Normal range of motion. Exhibits no edema Lymphadenopathy: Has no other cervical adenopathy.  Neurological: Pt is alert and oriented to person, place, and time. Pt has normal reflexes. No cranial nerve deficit. Motor grossly intact, Gait intact Skin: Skin is warm and dry. No rash noted or new ulcerations Psychiatric:  Has normal mood and affect. Behavior is normal without agitation No other exam findings  Lab Results  Component Value Date   WBC 8.0 05/07/2017   HGB 14.8 05/07/2017   HCT 45.5 05/07/2017   PLT 291.0 05/07/2017   GLUCOSE 152 (H) 05/07/2017   CHOL 123 05/07/2017   TRIG 123.0 05/07/2017   HDL 46.10 05/07/2017   LDLDIRECT 65.5 03/24/2012   LDLCALC 52 05/07/2017   ALT 16 05/07/2017   AST 20 05/07/2017   NA 137 05/07/2017   K 4.9 05/07/2017   CL 102 05/07/2017   CREATININE 0.68 05/07/2017   BUN 29 (H) 05/07/2017   CO2  25 05/07/2017   TSH 2.55 05/07/2017   INR 0.93 03/06/2017   HGBA1C 6.0 05/07/2017   MICROALBUR 1.3 05/07/2017       Assessment & Plan:

## 2017-05-09 NOTE — Telephone Encounter (Signed)
Pt states [facility] [employer]  phone has been down; requesting ZITHROMAX and Hycodan be called into:     [employer].  [place], [place]

## 2017-05-09 NOTE — Patient Instructions (Signed)
Please take all new medication as prescribed - the antibiotic, cough medicine if needed  Please continue all other medications as before, and refills have been done if requested.  Please have the pharmacy call with any other refills you may need.  Please continue your efforts at being more active, low cholesterol diet, and weight control.  You are otherwise up to date with prevention measures today.  Please keep your appointments with your specialists as you may have planned  Please return in 1 year for your yearly visit, or sooner if needed, with Lab testing done 3-5 days before

## 2017-05-10 MED ORDER — HYDROCODONE-HOMATROPINE 5-1.5 MG/5ML PO SYRP: 5.0000 mL | ORAL_SOLUTION | Freq: Four times a day (QID) | ORAL | 0 refills | Status: AC | PRN

## 2017-05-10 MED ORDER — AZITHROMYCIN 250 MG PO TABS: ORAL_TABLET | ORAL | 1 refills | Status: DC

## 2017-05-10 NOTE — Telephone Encounter (Signed)
Cough med resent

## 2017-05-10 NOTE — Telephone Encounter (Signed)
Resent z-pac..MD will need to resend cough syrup.Marland KitchenJohny Chess

## 2017-05-10 NOTE — Telephone Encounter (Signed)
Pt called back stating neither pharmacy has RXs. Pt is requesting to send them to [facility]. Their phones were down yesterday but are working today. Please resend asap.  [facility] [address], [place]  [place] [address]  Phone: [phone] Fax: [phone]

## 2017-05-10 NOTE — Telephone Encounter (Signed)
Call to patient- notified we are actively working on getting her prescriptions to the pharmacy for her so she can pick them up- sorry for the delay.

## 2017-05-11 DIAGNOSIS — R05 Cough: Secondary | ICD-10-CM | POA: Insufficient documentation

## 2017-05-11 DIAGNOSIS — R059 Cough, unspecified: Secondary | ICD-10-CM | POA: Insufficient documentation

## 2017-05-11 DIAGNOSIS — Z0000 Encounter for general adult medical examination without abnormal findings: Secondary | ICD-10-CM | POA: Insufficient documentation

## 2017-05-11 NOTE — Assessment & Plan Note (Signed)
stable overall by history and exam, recent data reviewed with pt, and pt to continue medical treatment as before,  to f/u any worsening symptoms or concerns Lab Results  Component Value Date   HGBA1C 6.0 05/07/2017

## 2017-05-11 NOTE — Assessment & Plan Note (Signed)

## 2017-05-11 NOTE — Assessment & Plan Note (Signed)
C.w bronchitis, for zpack, cough med prn,  to f/u any worsening symptoms or concerns

## 2017-05-16 DIAGNOSIS — H2511 Age-related nuclear cataract, right eye: Secondary | ICD-10-CM | POA: Diagnosis not present

## 2017-05-16 DIAGNOSIS — H25811 Combined forms of age-related cataract, right eye: Secondary | ICD-10-CM | POA: Diagnosis not present

## 2017-05-16 DIAGNOSIS — H401111 Primary open-angle glaucoma, right eye, mild stage: Secondary | ICD-10-CM | POA: Diagnosis not present

## 2017-05-17 ENCOUNTER — Encounter: Payer: Self-pay | Admitting: Internal Medicine

## 2017-05-17 MED ORDER — AMOXICILLIN 500 MG PO CAPS: 1000.0000 mg | ORAL_CAPSULE | Freq: Two times a day (BID) | ORAL | 0 refills | Status: DC

## 2017-05-30 ENCOUNTER — Other Ambulatory Visit: Payer: Self-pay | Admitting: Internal Medicine

## 2017-07-16 DIAGNOSIS — Z85828 Personal history of other malignant neoplasm of skin: Secondary | ICD-10-CM | POA: Diagnosis not present

## 2017-07-16 DIAGNOSIS — L821 Other seborrheic keratosis: Secondary | ICD-10-CM | POA: Diagnosis not present

## 2017-07-16 DIAGNOSIS — L814 Other melanin hyperpigmentation: Secondary | ICD-10-CM | POA: Diagnosis not present

## 2017-07-16 DIAGNOSIS — D1801 Hemangioma of skin and subcutaneous tissue: Secondary | ICD-10-CM | POA: Diagnosis not present

## 2017-07-16 DIAGNOSIS — D225 Melanocytic nevi of trunk: Secondary | ICD-10-CM | POA: Diagnosis not present

## 2017-07-24 DIAGNOSIS — H40053 Ocular hypertension, bilateral: Secondary | ICD-10-CM | POA: Diagnosis not present

## 2017-08-14 ENCOUNTER — Telehealth: Payer: Self-pay

## 2017-08-14 NOTE — Telephone Encounter (Signed)
Ok for this? 

## 2017-08-14 NOTE — Telephone Encounter (Signed)
Is this ok for patient? Please advise.  Copied from [place] [phone]. Topic: Appointment Scheduling - Scheduling Inquiry for Clinic >> Aug 14, 2017  9:52 AM Judyann Munson wrote: Reason for CRM: Patient is calling to schedule a Hepatis A shot. Before she goes out of town 08/24/17. Her best contact number [phone]. Please advise  >> Aug 14, 2017 10:22 AM Morey Hummingbird wrote: Is patient ok to start these?

## 2017-08-14 NOTE — Telephone Encounter (Signed)
appt made

## 2017-08-15 ENCOUNTER — Ambulatory Visit: Payer: PPO

## 2017-08-16 DIAGNOSIS — M7062 Trochanteric bursitis, left hip: Secondary | ICD-10-CM | POA: Insufficient documentation

## 2017-10-24 ENCOUNTER — Encounter: Payer: Self-pay | Admitting: Internal Medicine

## 2017-10-24 LAB — HM DIABETES EYE EXAM

## 2017-11-04 DIAGNOSIS — L905 Scar conditions and fibrosis of skin: Secondary | ICD-10-CM | POA: Diagnosis not present

## 2017-11-07 ENCOUNTER — Encounter: Payer: Self-pay | Admitting: Internal Medicine

## 2017-11-24 ENCOUNTER — Other Ambulatory Visit: Payer: Self-pay | Admitting: Internal Medicine

## 2017-11-27 ENCOUNTER — Ambulatory Visit (INDEPENDENT_AMBULATORY_CARE_PROVIDER_SITE_OTHER): Payer: PPO

## 2017-11-27 DIAGNOSIS — Z23 Encounter for immunization: Secondary | ICD-10-CM

## 2017-12-20 ENCOUNTER — Ambulatory Visit: Payer: PPO | Admitting: Internal Medicine

## 2018-02-12 DIAGNOSIS — H40013 Open angle with borderline findings, low risk, bilateral: Secondary | ICD-10-CM | POA: Diagnosis not present

## 2018-05-23 ENCOUNTER — Other Ambulatory Visit: Payer: Self-pay | Admitting: Internal Medicine

## 2018-07-24 DIAGNOSIS — B351 Tinea unguium: Secondary | ICD-10-CM | POA: Diagnosis not present

## 2018-07-24 DIAGNOSIS — Z85828 Personal history of other malignant neoplasm of skin: Secondary | ICD-10-CM | POA: Diagnosis not present

## 2018-07-24 DIAGNOSIS — L821 Other seborrheic keratosis: Secondary | ICD-10-CM | POA: Diagnosis not present

## 2018-07-24 DIAGNOSIS — D225 Melanocytic nevi of trunk: Secondary | ICD-10-CM | POA: Diagnosis not present

## 2018-07-24 DIAGNOSIS — D1801 Hemangioma of skin and subcutaneous tissue: Secondary | ICD-10-CM | POA: Diagnosis not present

## 2018-07-24 DIAGNOSIS — B353 Tinea pedis: Secondary | ICD-10-CM | POA: Diagnosis not present

## 2018-08-11 ENCOUNTER — Encounter: Payer: Self-pay | Admitting: Internal Medicine

## 2018-08-21 ENCOUNTER — Other Ambulatory Visit: Payer: Self-pay | Admitting: Internal Medicine

## 2018-09-04 ENCOUNTER — Other Ambulatory Visit: Payer: Self-pay

## 2018-09-16 ENCOUNTER — Telehealth: Payer: Self-pay | Admitting: Internal Medicine

## 2018-09-16 NOTE — Telephone Encounter (Signed)
Recv'd medical records from [facility]  forwarded 8 pages to [facility] (Dr. Jeneen Rinks John)8/11/20fbg

## 2018-10-24 ENCOUNTER — Encounter: Payer: Self-pay | Admitting: Internal Medicine

## 2018-10-28 DIAGNOSIS — H35362 Drusen (degenerative) of macula, left eye: Secondary | ICD-10-CM | POA: Diagnosis not present

## 2018-10-28 LAB — HM DIABETES EYE EXAM

## 2018-10-29 ENCOUNTER — Ambulatory Visit (INDEPENDENT_AMBULATORY_CARE_PROVIDER_SITE_OTHER): Payer: PPO | Admitting: Internal Medicine

## 2018-10-29 ENCOUNTER — Telehealth: Payer: Self-pay

## 2018-10-29 ENCOUNTER — Other Ambulatory Visit (INDEPENDENT_AMBULATORY_CARE_PROVIDER_SITE_OTHER): Payer: PPO

## 2018-10-29 ENCOUNTER — Encounter: Payer: Self-pay | Admitting: Internal Medicine

## 2018-10-29 ENCOUNTER — Other Ambulatory Visit: Payer: Self-pay | Admitting: Internal Medicine

## 2018-10-29 ENCOUNTER — Other Ambulatory Visit: Payer: Self-pay

## 2018-10-29 VITALS — BP 124/86 | HR 86 | Temp 97.8°F | Ht 65.0 in | Wt 203.0 lb

## 2018-10-29 DIAGNOSIS — E611 Iron deficiency: Secondary | ICD-10-CM | POA: Diagnosis not present

## 2018-10-29 DIAGNOSIS — Z0000 Encounter for general adult medical examination without abnormal findings: Secondary | ICD-10-CM

## 2018-10-29 DIAGNOSIS — E119 Type 2 diabetes mellitus without complications: Secondary | ICD-10-CM

## 2018-10-29 DIAGNOSIS — E559 Vitamin D deficiency, unspecified: Secondary | ICD-10-CM | POA: Diagnosis not present

## 2018-10-29 DIAGNOSIS — E538 Deficiency of other specified B group vitamins: Secondary | ICD-10-CM

## 2018-10-29 DIAGNOSIS — Z23 Encounter for immunization: Secondary | ICD-10-CM | POA: Diagnosis not present

## 2018-10-29 LAB — HEPATIC FUNCTION PANEL
ALT: 25 U/L (ref 0–35)
AST: 26 U/L (ref 0–37)
Albumin: 4.4 g/dL (ref 3.5–5.2)
Alkaline Phosphatase: 71 U/L (ref 39–117)
Bilirubin, Direct: 0.1 mg/dL (ref 0.0–0.3)
Total Bilirubin: 1 mg/dL (ref 0.2–1.2)
Total Protein: 7.2 g/dL (ref 6.0–8.3)

## 2018-10-29 LAB — URINALYSIS, ROUTINE W REFLEX MICROSCOPIC
Bilirubin Urine: NEGATIVE
Hgb urine dipstick: NEGATIVE
Ketones, ur: NEGATIVE
Nitrite: NEGATIVE
RBC / HPF: NONE SEEN (ref 0–?)
Specific Gravity, Urine: 1.02 (ref 1.000–1.030)
Total Protein, Urine: NEGATIVE
Urine Glucose: NEGATIVE
Urobilinogen, UA: 0.2 (ref 0.0–1.0)
pH: 5.5 (ref 5.0–8.0)

## 2018-10-29 LAB — LIPID PANEL
Cholesterol: 139 mg/dL (ref 0–200)
HDL: 42.8 mg/dL (ref >39.00)
LDL Cholesterol: 60 mg/dL (ref 0–99)
NonHDL: 96.07
Total CHOL/HDL Ratio: 3
Triglycerides: 180 mg/dL — ABNORMAL HIGH (ref 0.0–149.0)
VLDL: 36 mg/dL (ref 0.0–40.0)

## 2018-10-29 LAB — CBC WITH DIFFERENTIAL/PLATELET
Basophils Absolute: 0.1 10*3/uL (ref 0.0–0.1)
Basophils Relative: 1 % (ref 0.0–3.0)
Eosinophils Absolute: 0.2 10*3/uL (ref 0.0–0.7)
Eosinophils Relative: 2.1 % (ref 0.0–5.0)
HCT: 47.7 % — ABNORMAL HIGH (ref 36.0–46.0)
Hemoglobin: 16 g/dL — ABNORMAL HIGH (ref 12.0–15.0)
Lymphocytes Relative: 20.5 % (ref 12.0–46.0)
Lymphs Abs: 2 10*3/uL (ref 0.7–4.0)
MCHC: 33.5 g/dL (ref 30.0–36.0)
MCV: 91.2 fl (ref 78.0–100.0)
Monocytes Absolute: 0.8 10*3/uL (ref 0.1–1.0)
Monocytes Relative: 8.2 % (ref 3.0–12.0)
Neutro Abs: 6.8 10*3/uL (ref 1.4–7.7)
Neutrophils Relative %: 68.2 % (ref 43.0–77.0)
Platelets: 200 10*3/uL (ref 150.0–400.0)
RBC: 5.24 Mil/uL — ABNORMAL HIGH (ref 3.87–5.11)
RDW: 13 % (ref 11.5–15.5)
WBC: 10 10*3/uL (ref 4.0–10.5)

## 2018-10-29 LAB — BASIC METABOLIC PANEL
BUN: 17 mg/dL (ref 6–23)
CO2: 22 mEq/L (ref 19–32)
Calcium: 9.7 mg/dL (ref 8.4–10.5)
Chloride: 99 mEq/L (ref 96–112)
Creatinine, Ser: 0.81 mg/dL (ref 0.40–1.20)
GFR: 68.95 mL/min (ref >60.00)
Glucose, Bld: 259 mg/dL — ABNORMAL HIGH (ref 70–99)
Potassium: 4.6 mEq/L (ref 3.5–5.1)
Sodium: 134 mEq/L — ABNORMAL LOW (ref 135–145)

## 2018-10-29 LAB — VITAMIN D 25 HYDROXY (VIT D DEFICIENCY, FRACTURES): VITD: 34.58 ng/mL (ref 30.00–100.00)

## 2018-10-29 LAB — IBC PANEL
Iron: 92 ug/dL (ref 42–145)
Saturation Ratios: 26.8 % (ref 20.0–50.0)
Transferrin: 245 mg/dL (ref 212.0–360.0)

## 2018-10-29 LAB — MICROALBUMIN / CREATININE URINE RATIO
Creatinine,U: 72 mg/dL
Microalb Creat Ratio: 2.7 mg/g (ref 0.0–30.0)
Microalb, Ur: 1.9 mg/dL (ref 0.0–1.9)

## 2018-10-29 LAB — TSH: TSH: 2.52 u[IU]/mL (ref 0.35–4.50)

## 2018-10-29 LAB — VITAMIN B12: Vitamin B-12: 1061 pg/mL — ABNORMAL HIGH (ref 211–911)

## 2018-10-29 LAB — HEMOGLOBIN A1C: Hgb A1c MFr Bld: 9.2 % — ABNORMAL HIGH (ref 4.6–6.5)

## 2018-10-29 MED ORDER — METFORMIN HCL 500 MG PO TABS: ORAL_TABLET | ORAL | 3 refills | Status: DC

## 2018-10-29 NOTE — Telephone Encounter (Signed)
Pt has viewed results via MyChart  

## 2018-10-29 NOTE — Patient Instructions (Addendum)
You had the Pneumovax pneumonia shot today  Please continue all other medications as before, and refills have been done if requested.  Please have the pharmacy call with any other refills you may need.  Please continue your efforts at being more active, low cholesterol diet, and weight control.  You are otherwise up to date with prevention measures today.  Please keep your appointments with your specialists as you may have planned  Please go to the LAB in the Basement (turn left off the elevator) for the tests to be done today  You will be contacted by phone if any changes need to be made immediately.  Otherwise, you will receive a letter about your results with an explanation, but please check with MyChart first.  Please return in 6 months, or sooner if needed, with Lab testing done 3-5 days before

## 2018-10-29 NOTE — Assessment & Plan Note (Signed)

## 2018-10-29 NOTE — Assessment & Plan Note (Signed)
stable overall by history and exam, recent data reviewed with pt, and pt to continue medical treatment as before,  to f/u any worsening symptoms or concerns  

## 2018-10-29 NOTE — Telephone Encounter (Signed)
-----   Message from Biagio Borg, MD sent at 10/29/2018  1:03 PM EDT ----- Left message on MyChart, pt to cont same tx except  The test results show that your current treatment is OK, as the tests are stable, except the A1c is severely high.  Please increase the metformin to 1000 mg twice per day, and please follow a diabetic diet, as well as trying to stay active.Redmond Baseman to please inform pt, I will do rx

## 2018-10-29 NOTE — Progress Notes (Signed)
 Subjective:    Patient ID: [name] [name], female    DOB: 06/20/1945, 73 y.o.   MRN: [id number]  HPI    Here for wellness and f/u;  Overall doing ok;  Pt denies Chest pain, worsening SOB, DOE, wheezing, orthopnea, PND, worsening LE edema, palpitations, dizziness or syncope.  Pt denies neurological change such as new headache, facial or extremity weakness.  Pt denies polydipsia, polyuria, or low sugar symptoms. Pt states overall good compliance with treatment and medications, good tolerability, and has been trying to follow appropriate diet.  Pt denies worsening depressive symptoms, suicidal ideation or panic. No fever, night sweats, wt loss, loss of appetite, or other constitutional symptoms.  Pt states good ability with ADL's, has low fall risk, home safety reviewed and adequate, no other significant changes in hearing or vision, and only occasionally active with exercise.  No new complaints Wt Readings from Last 3 Encounters:  10/29/18 203 lb (92.1 kg)  05/09/17 195 lb (88.5 kg)  03/11/17 202 lb (91.6 kg)   BP Readings from Last 3 Encounters:  10/29/18 124/86  05/09/17 126/88  03/12/17 (!) 124/56  s/p terbenifine course for 90 days for toenail fungus.   Past Medical History:  Diagnosis Date  . DIVERTICULOSIS, COLON 09/10/2007  . DM 03/26/2008  . Glaucoma 03/17/2011  . HYPERLIPIDEMIA 09/10/2007  . HYPERTENSION 09/10/2007  . Hypertension   . Osteoarthritis 10/21/2011  . Shingles 03/17/2011   Past Surgical History:  Procedure Laterality Date  . ABDOMINAL HYSTERECTOMY    . HEMATOMA EVACUATION     2003; abdominal GI bleed 2nd-ary to Aspirin consumption  . 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 TOTAL KNEE ARTHROPLASTY Right 03/11/2017   Procedure: RIGHT TOTAL KNEE ARTHROPLASTY;  Surgeon: Aluisio, Frank, MD;  Location: [facility];  Service:  Orthopedics;  Laterality: Right;  . TUBAL LIGATION      reports that she has never smoked. She has never used smokeless tobacco. She reports current alcohol use. She reports that she does not use drugs. family history includes Hypertension in her father and mother. Allergies  Allergen Reactions  . Ivp Dye [Iodinated Diagnostic Agents]   . Sulfamethoxazole     REACTION: unspecified   Current Outpatient Medications on File Prior to Visit  Medication Sig Dispense Refill  . acetaminophen (TYLENOL) 500 MG tablet Take 1,000 mg by mouth every 8 (eight) hours as needed for mild pain or moderate pain.    . bimatoprost (LUMIGAN) 0.01 % SOLN Place 1 drop into both eyes at bedtime.     . Blood Glucose Monitoring Suppl (ONE TOUCH ULTRA 2) w/Device KIT Use as directed  E11.9 1 each 0  . glucose blood (ONE TOUCH ULTRA TEST) test strip Use as instructed three times per day  E11.9 300 each 3  . Lancet Device MISC Use as directed three times daily E11.9 300 each 3  . losartan (COZAAR) 100 MG tablet TAKE ONE TABLET BY MOUTH DAILY 90 tablet 0  . metFORMIN (GLUCOPHAGE) 500 MG tablet TAKE ONE TABLET BY MOUTH TWICE A DAY WITH A MEAL 180 tablet 0  . methocarbamol (ROBAXIN) 500 MG tablet Take 1 tablet (500 mg total) by mouth every 6 (six) hours as needed for muscle spasms. 60 tablet 0  . oxyCODONE (OXY IR/ROXICODONE) 5 MG immediate release tablet Take 1 tablet (5 mg total) by   mouth every 4 (four) hours as needed for moderate pain or severe pain. 40 tablet 0  . rivaroxaban (XARELTO) 10 MG TABS tablet Take 1 tablet (10 mg total) by mouth daily with breakfast. Take Xarelto for two and a half more weeks following discharge from the hospital, then discontinue Xarelto. Once the patient has completed the blood thinner regimen, then take a Baby 81 mg Aspirin daily for three more weeks. 20 tablet 0  . simvastatin (ZOCOR) 40 MG tablet TAKE ONE TABLET BY MOUTH DAILY 90 tablet 0  . traMADol (ULTRAM) 50 MG tablet Take 1-2  tablets (50-100 mg total) by mouth every 6 (six) hours as needed (mild pain). 56 tablet 0   No current facility-administered medications on file prior to visit.    Review of Systems Constitutional: Negative for other unusual diaphoresis, sweats, appetite or weight changes HENT: Negative for other worsening hearing loss, ear pain, facial swelling, mouth sores or neck stiffness.   Eyes: Negative for other worsening pain, redness or other visual disturbance.  Respiratory: Negative for other stridor or swelling Cardiovascular: Negative for other palpitations or other chest pain  Gastrointestinal: Negative for worsening diarrhea or loose stools, blood in stool, distention or other pain Genitourinary: Negative for hematuria, flank pain or other change in urine volume.  Musculoskeletal: Negative for myalgias or other joint swelling.  Skin: Negative for other color change, or other wound or worsening drainage.  Neurological: Negative for other syncope or numbness. Hematological: Negative for other adenopathy or swelling Psychiatric/Behavioral: Negative for hallucinations, other worsening agitation, SI, self-injury, or new decreased concentration All otherwise neg per pt     Objective:   Physical Exam BP 124/86   Pulse 86   Temp 97.8 F (36.6 C) (Oral)   Ht 5' 5" (1.651 m)   Wt 203 lb (92.1 kg)   SpO2 98%   BMI 33.78 kg/m  VS noted,  Constitutional: Pt is oriented to person, place, and time. Appears well-developed and well-nourished, in no significant distress and comfortable Head: Normocephalic and atraumatic  Eyes: Conjunctivae and EOM are normal. Pupils are equal, round, and reactive to light Right Ear: External ear normal without discharge Left Ear: External ear normal without discharge Nose: Nose without discharge or deformity Mouth/Throat: Oropharynx is without other ulcerations and moist  Neck: Normal range of motion. Neck supple. No JVD present. No tracheal deviation present or  significant neck LA or mass Cardiovascular: Normal rate, regular rhythm, normal heart sounds and intact distal pulses.   Pulmonary/Chest: WOB normal and breath sounds without rales or wheezing  Abdominal: Soft. Bowel sounds are normal. NT. No HSM  Musculoskeletal: Normal range of motion. Exhibits no edema Lymphadenopathy: Has no other cervical adenopathy.  Neurological: Pt is alert and oriented to person, place, and time. Pt has normal reflexes. No cranial nerve deficit. Motor grossly intact, Gait intact Skin: Skin is warm and dry. No rash noted or new ulcerations Psychiatric:  Has normal mood and affect. Behavior is normal without agitation All otherwise neg per pt  Lab Results  Component Value Date   WBC 8.0 05/07/2017   HGB 14.8 05/07/2017   HCT 45.5 05/07/2017   PLT 291.0 05/07/2017   GLUCOSE 152 (H) 05/07/2017   CHOL 123 05/07/2017   TRIG 123.0 05/07/2017   HDL 46.10 05/07/2017   LDLDIRECT 65.5 03/24/2012   LDLCALC 52 05/07/2017   ALT 16 05/07/2017   AST 20 05/07/2017   NA 137 05/07/2017   K 4.9 05/07/2017   CL 102 05/07/2017     CREATININE 0.68 05/07/2017   BUN 29 (H) 05/07/2017   CO2 25 05/07/2017   TSH 2.55 05/07/2017   INR 0.93 03/06/2017   HGBA1C 6.0 05/07/2017   MICROALBUR 1.3 05/07/2017       Assessment & Plan:   

## 2018-10-29 NOTE — Addendum Note (Signed)
Addended by: Juliet Rude on: 10/29/2018 09:56 AM   Modules accepted: Orders

## 2018-11-19 ENCOUNTER — Other Ambulatory Visit: Payer: Self-pay | Admitting: Internal Medicine

## 2019-01-26 ENCOUNTER — Encounter: Payer: Self-pay | Admitting: Internal Medicine

## 2019-02-20 ENCOUNTER — Encounter: Payer: Self-pay | Admitting: Internal Medicine

## 2019-03-02 ENCOUNTER — Other Ambulatory Visit: Payer: Self-pay | Admitting: Internal Medicine

## 2019-03-03 DIAGNOSIS — H40013 Open angle with borderline findings, low risk, bilateral: Secondary | ICD-10-CM | POA: Diagnosis not present

## 2019-04-10 ENCOUNTER — Telehealth: Payer: Self-pay | Admitting: Internal Medicine

## 2019-04-10 DIAGNOSIS — E119 Type 2 diabetes mellitus without complications: Secondary | ICD-10-CM

## 2019-04-10 DIAGNOSIS — E538 Deficiency of other specified B group vitamins: Secondary | ICD-10-CM

## 2019-04-10 DIAGNOSIS — E611 Iron deficiency: Secondary | ICD-10-CM

## 2019-04-10 DIAGNOSIS — Z0000 Encounter for general adult medical examination without abnormal findings: Secondary | ICD-10-CM

## 2019-04-10 DIAGNOSIS — E559 Vitamin D deficiency, unspecified: Secondary | ICD-10-CM

## 2019-04-10 NOTE — Telephone Encounter (Signed)
Labs ordered for [facility]

## 2019-04-10 NOTE — Telephone Encounter (Signed)
New message:    Pt would like to have labs done before her appt if able to do so. Please advise and let patient know when labs have been ordered.

## 2019-04-13 NOTE — Telephone Encounter (Signed)
Notified pt MD ok labs prior..made lab appt for 3/26.Marland KitchenJohny [name]

## 2019-04-28 ENCOUNTER — Ambulatory Visit: Payer: PPO | Admitting: Internal Medicine

## 2019-05-01 ENCOUNTER — Other Ambulatory Visit: Payer: Self-pay

## 2019-05-01 ENCOUNTER — Other Ambulatory Visit (INDEPENDENT_AMBULATORY_CARE_PROVIDER_SITE_OTHER): Payer: PPO

## 2019-05-01 DIAGNOSIS — Z0000 Encounter for general adult medical examination without abnormal findings: Secondary | ICD-10-CM | POA: Diagnosis not present

## 2019-05-01 DIAGNOSIS — E559 Vitamin D deficiency, unspecified: Secondary | ICD-10-CM | POA: Diagnosis not present

## 2019-05-01 DIAGNOSIS — E611 Iron deficiency: Secondary | ICD-10-CM | POA: Diagnosis not present

## 2019-05-01 DIAGNOSIS — E538 Deficiency of other specified B group vitamins: Secondary | ICD-10-CM | POA: Diagnosis not present

## 2019-05-01 DIAGNOSIS — E119 Type 2 diabetes mellitus without complications: Secondary | ICD-10-CM | POA: Diagnosis not present

## 2019-05-01 LAB — URINALYSIS, ROUTINE W REFLEX MICROSCOPIC
Bilirubin Urine: NEGATIVE
Hgb urine dipstick: NEGATIVE
Ketones, ur: NEGATIVE
Nitrite: NEGATIVE
RBC / HPF: NONE SEEN (ref 0–?)
Specific Gravity, Urine: 1.015 (ref 1.000–1.030)
Total Protein, Urine: NEGATIVE
Urine Glucose: NEGATIVE
Urobilinogen, UA: 0.2 (ref 0.0–1.0)
pH: 5.5 (ref 5.0–8.0)

## 2019-05-01 LAB — CBC WITH DIFFERENTIAL/PLATELET
Basophils Absolute: 0.1 10*3/uL (ref 0.0–0.1)
Basophils Relative: 0.8 % (ref 0.0–3.0)
Eosinophils Absolute: 0.2 10*3/uL (ref 0.0–0.7)
Eosinophils Relative: 3.4 % (ref 0.0–5.0)
HCT: 41.8 % (ref 36.0–46.0)
Hemoglobin: 14.2 g/dL (ref 12.0–15.0)
Lymphocytes Relative: 27.1 % (ref 12.0–46.0)
Lymphs Abs: 1.9 10*3/uL (ref 0.7–4.0)
MCHC: 34 g/dL (ref 30.0–36.0)
MCV: 92.4 fl (ref 78.0–100.0)
Monocytes Absolute: 0.7 10*3/uL (ref 0.1–1.0)
Monocytes Relative: 9.5 % (ref 3.0–12.0)
Neutro Abs: 4.1 10*3/uL (ref 1.4–7.7)
Neutrophils Relative %: 59.2 % (ref 43.0–77.0)
Platelets: 238 10*3/uL (ref 150.0–400.0)
RBC: 4.52 Mil/uL (ref 3.87–5.11)
RDW: 13.1 % (ref 11.5–15.5)
WBC: 7 10*3/uL (ref 4.0–10.5)

## 2019-05-01 LAB — BASIC METABOLIC PANEL
BUN: 16 mg/dL (ref 6–23)
CO2: 22 mEq/L (ref 19–32)
Calcium: 8.6 mg/dL (ref 8.4–10.5)
Chloride: 105 mEq/L (ref 96–112)
Creatinine, Ser: 0.8 mg/dL (ref 0.40–1.20)
GFR: 69.85 mL/min (ref >60.00)
Glucose, Bld: 234 mg/dL — ABNORMAL HIGH (ref 70–99)
Potassium: 4.3 mEq/L (ref 3.5–5.1)
Sodium: 136 mEq/L (ref 135–145)

## 2019-05-01 LAB — LIPID PANEL
Cholesterol: 110 mg/dL (ref 0–200)
HDL: 39 mg/dL — ABNORMAL LOW
LDL Cholesterol: 45 mg/dL (ref 0–99)
NonHDL: 70.95
Total CHOL/HDL Ratio: 3
Triglycerides: 131 mg/dL (ref 0.0–149.0)
VLDL: 26.2 mg/dL (ref 0.0–40.0)

## 2019-05-01 LAB — HEPATIC FUNCTION PANEL
ALT: 21 U/L (ref 0–35)
AST: 25 U/L (ref 0–37)
Albumin: 4.1 g/dL (ref 3.5–5.2)
Alkaline Phosphatase: 71 U/L (ref 39–117)
Bilirubin, Direct: 0.2 mg/dL (ref 0.0–0.3)
Total Bilirubin: 1 mg/dL (ref 0.2–1.2)
Total Protein: 6.4 g/dL (ref 6.0–8.3)

## 2019-05-01 LAB — TSH: TSH: 3.71 u[IU]/mL (ref 0.35–4.50)

## 2019-05-01 LAB — VITAMIN D 25 HYDROXY (VIT D DEFICIENCY, FRACTURES): VITD: 41.02 ng/mL (ref 30.00–100.00)

## 2019-05-01 LAB — IBC PANEL
Iron: 87 ug/dL (ref 42–145)
Saturation Ratios: 28.6 % (ref 20.0–50.0)
Transferrin: 217 mg/dL (ref 212.0–360.0)

## 2019-05-01 LAB — MICROALBUMIN / CREATININE URINE RATIO
Creatinine,U: 51.1 mg/dL
Microalb Creat Ratio: 2.6 mg/g (ref 0.0–30.0)
Microalb, Ur: 1.3 mg/dL (ref 0.0–1.9)

## 2019-05-01 LAB — VITAMIN B12: Vitamin B-12: 577 pg/mL (ref 211–911)

## 2019-05-01 LAB — HEMOGLOBIN A1C: Hgb A1c MFr Bld: 8.9 % — ABNORMAL HIGH (ref 4.6–6.5)

## 2019-05-04 ENCOUNTER — Encounter: Payer: Self-pay | Admitting: Internal Medicine

## 2019-05-04 ENCOUNTER — Other Ambulatory Visit: Payer: Self-pay

## 2019-05-04 ENCOUNTER — Ambulatory Visit (INDEPENDENT_AMBULATORY_CARE_PROVIDER_SITE_OTHER): Payer: PPO | Admitting: Internal Medicine

## 2019-05-04 DIAGNOSIS — E119 Type 2 diabetes mellitus without complications: Secondary | ICD-10-CM | POA: Diagnosis not present

## 2019-05-04 DIAGNOSIS — Z0000 Encounter for general adult medical examination without abnormal findings: Secondary | ICD-10-CM

## 2019-05-04 MED ORDER — LANCET DEVICE MISC: 3 refills | Status: AC

## 2019-05-04 MED ORDER — GLUCOSE BLOOD VI STRP: ORAL_STRIP | 3 refills | Status: DC

## 2019-05-04 NOTE — Patient Instructions (Addendum)
Please consider the Shingles shot # 1 after 2 wks of having the COVID vaccine by making a Nurse Visit appt  The second shingles shot #2 would be 2 to 6 months after the first shot  Please call if you change your mind about the Trulicity  Please continue all other medications as before, and refills have been done if requested.  Please have the pharmacy call with any other refills you may need.  Please continue your efforts at being more active, low cholesterol diet, and weight control.  You are otherwise up to date with prevention measures today.  Please keep your appointments with your specialists as you may have planned  Please make an Appointment to return in 6 months, or sooner if needed, also with Lab Appointment for testing done 3-5 days before at the [facility] (so this is for TWO appointments - please see the scheduling desk as you leave)

## 2019-05-04 NOTE — Assessment & Plan Note (Signed)
Uncontrolled, d/w pt adding trulicity to help dm and wt los but declines for now, plans to go to a friends local wt loss clinic

## 2019-05-04 NOTE — Progress Notes (Signed)
Subjective:    Patient ID: [name] [name], female    DOB: 1945-10-25, 74 y.o.   MRN: [id number]  HPI  Here for wellness and f/u;  Overall doing ok;  Pt denies Chest pain, worsening SOB, DOE, wheezing, orthopnea, PND, worsening LE edema, palpitations, dizziness or syncope.  Pt denies neurological change such as new headache, facial or extremity weakness.  Pt denies polydipsia, polyuria, or low sugar symptoms. Pt states overall good compliance with treatment and medications, good tolerability, and has been trying to follow appropriate diet.  Pt denies worsening depressive symptoms, suicidal ideation or panic. No fever, night sweats, wt loss, loss of appetite, or other constitutional symptoms.  Pt states good ability with ADL's, has low fall risk, home safety reviewed and adequate, no other significant changes in hearing or vision, and only occasionally active with exercise.  gained wt with less active during pandemic.  Going to wt loss clinic soon, does not want med changes Wt Readings from Last 3 Encounters:  05/04/19 204 lb 6.4 oz (92.7 kg)  10/29/18 203 lb (92.1 kg)  05/09/17 195 lb (88.5 kg)   Past Medical History:  Diagnosis Date  . DIVERTICULOSIS, COLON 09/10/2007  . DM 03/26/2008  . Glaucoma 03/17/2011  . HYPERLIPIDEMIA 09/10/2007  . HYPERTENSION 09/10/2007  . Hypertension   . Osteoarthritis 10/21/2011  . Shingles 03/17/2011   Past Surgical History:  Procedure Laterality Date  . ABDOMINAL HYSTERECTOMY    . HEMATOMA EVACUATION     2003; abdominal GI bleed 2nd-ary to Aspirin consumption  . 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 TOTAL KNEE ARTHROPLASTY Right 03/11/2017   Procedure: RIGHT TOTAL KNEE ARTHROPLASTY;  Surgeon: Gaynelle Arabian, MD;  Location: [facility];  Service: Orthopedics;  Laterality: Right;  . TUBAL LIGATION      reports that she  has never smoked. She has never used smokeless tobacco. She reports current alcohol use. She reports that she does not use drugs. family history includes Hypertension in her father and mother. Allergies  Allergen Reactions  . Ivp Dye [Iodinated Diagnostic Agents]   . Sulfamethoxazole     REACTION: unspecified   Current Outpatient Medications on File Prior to Visit  Medication Sig Dispense Refill  . bimatoprost (LUMIGAN) 0.01 % SOLN Place 1 drop into both eyes at bedtime.     . Blood Glucose Monitoring Suppl (ONE TOUCH ULTRA 2) w/Device KIT Use as directed  E11.9 1 each 0  . losartan (COZAAR) 100 MG tablet TAKE ONE TABLET BY MOUTH DAILY 90 tablet 2  . metFORMIN (GLUCOPHAGE) 500 MG tablet TAKE two TABLET BY MOUTH TWICE A DAY WITH A MEAL 360 tablet 3  . simvastatin (ZOCOR) 40 MG tablet TAKE ONE TABLET BY MOUTH DAILY 90 tablet 2  . acetaminophen (TYLENOL) 500 MG tablet Take 1,000 mg by mouth every 8 (eight) hours as needed for mild pain or moderate pain.    . methocarbamol (ROBAXIN) 500 MG tablet Take 1 tablet (500 mg total) by mouth every 6 (six) hours as needed for muscle spasms. 60 tablet 0  . oxyCODONE (OXY IR/ROXICODONE) 5 MG immediate release tablet Take 1 tablet (5 mg total) by mouth every 4 (four) hours as needed for moderate pain or severe pain. 40 tablet 0  . rivaroxaban (XARELTO) 10 MG TABS tablet Take 1 tablet (10 mg total) by mouth daily  with breakfast. Take Xarelto for two and a half more weeks following discharge from the hospital, then discontinue Xarelto. Once the patient has completed the blood thinner regimen, then take a Baby 81 mg Aspirin daily for three more weeks. 20 tablet 0  . traMADol (ULTRAM) 50 MG tablet Take 1-2 tablets (50-100 mg total) by mouth every 6 (six) hours as needed (mild pain). 56 tablet 0   No current facility-administered medications on file prior to visit.   Review of Systems All otherwise neg per pt     Objective:   Physical Exam BP 132/84    Pulse 82   Temp 98.2 F (36.8 C)   Ht '5\' 5"'  (1.651 m)   Wt 204 lb 6.4 oz (92.7 kg)   SpO2 99%   BMI 34.01 kg/m  VS noted,  Constitutional: Pt appears in NAD HENT: Head: NCAT.  Right Ear: External ear normal.  Left Ear: External ear normal.  Eyes: . Pupils are equal, round, and reactive to light. Conjunctivae and EOM are normal Nose: without d/c or deformity Neck: Neck supple. Gross normal ROM Cardiovascular: Normal rate and regular rhythm.   Pulmonary/Chest: Effort normal and breath sounds without rales or wheezing.  Abd:  Soft, NT, ND, + BS, no organomegaly Neurological: Pt is alert. At baseline orientation, motor grossly intact Skin: Skin is warm. No rashes, other new lesions, no LE edema Psychiatric: Pt behavior is normal without agitation  All otherwise neg per pt Lab Results  Component Value Date   WBC 7.0 05/01/2019   HGB 14.2 05/01/2019   HCT 41.8 05/01/2019   PLT 238.0 05/01/2019   GLUCOSE 234 (H) 05/01/2019   CHOL 110 05/01/2019   TRIG 131.0 05/01/2019   HDL 39.00 (L) 05/01/2019   LDLDIRECT 65.5 03/24/2012   LDLCALC 45 05/01/2019   ALT 21 05/01/2019   AST 25 05/01/2019   NA 136 05/01/2019   K 4.3 05/01/2019   CL 105 05/01/2019   CREATININE 0.80 05/01/2019   BUN 16 05/01/2019   CO2 22 05/01/2019   TSH 3.71 05/01/2019   INR 0.93 03/06/2017   HGBA1C 8.9 (H) 05/01/2019   MICROALBUR 1.3 05/01/2019      Assessment & Plan:

## 2019-05-04 NOTE — Assessment & Plan Note (Signed)

## 2019-05-15 ENCOUNTER — Encounter: Payer: Self-pay | Admitting: Internal Medicine

## 2019-05-19 DIAGNOSIS — I10 Essential (primary) hypertension: Secondary | ICD-10-CM | POA: Diagnosis not present

## 2019-05-19 DIAGNOSIS — M25569 Pain in unspecified knee: Secondary | ICD-10-CM | POA: Diagnosis not present

## 2019-05-19 DIAGNOSIS — R635 Abnormal weight gain: Secondary | ICD-10-CM | POA: Diagnosis not present

## 2019-05-25 ENCOUNTER — Encounter: Payer: Self-pay | Admitting: Internal Medicine

## 2019-05-25 MED ORDER — TRULICITY 0.75 MG/0.5ML ~~LOC~~ SOAJ: 0.7500 mg | SUBCUTANEOUS | 3 refills | Status: DC

## 2019-05-26 ENCOUNTER — Encounter: Payer: Self-pay | Admitting: Internal Medicine

## 2019-05-26 MED ORDER — FREESTYLE LIBRE 14 DAY SENSOR MISC: 1.0000 | 3 refills | Status: DC

## 2019-05-26 MED ORDER — FREESTYLE LIBRE 14 DAY READER DEVI: 1.0000 | Freq: Every day | 1 refills | Status: AC | PRN

## 2019-06-18 DIAGNOSIS — E119 Type 2 diabetes mellitus without complications: Secondary | ICD-10-CM | POA: Diagnosis not present

## 2019-06-18 DIAGNOSIS — Z79899 Other long term (current) drug therapy: Secondary | ICD-10-CM | POA: Diagnosis not present

## 2019-06-18 DIAGNOSIS — R635 Abnormal weight gain: Secondary | ICD-10-CM | POA: Diagnosis not present

## 2019-06-23 DIAGNOSIS — H401131 Primary open-angle glaucoma, bilateral, mild stage: Secondary | ICD-10-CM | POA: Diagnosis not present

## 2019-07-20 DIAGNOSIS — Z79899 Other long term (current) drug therapy: Secondary | ICD-10-CM | POA: Diagnosis not present

## 2019-07-20 DIAGNOSIS — R635 Abnormal weight gain: Secondary | ICD-10-CM | POA: Diagnosis not present

## 2019-07-20 DIAGNOSIS — E119 Type 2 diabetes mellitus without complications: Secondary | ICD-10-CM | POA: Diagnosis not present

## 2019-08-12 ENCOUNTER — Other Ambulatory Visit: Payer: Self-pay | Admitting: General Practice

## 2019-08-12 NOTE — Patient Outreach (Signed)
[facility]) Care Management  08/12/2019  [name] [name] Dec 29, 1945 [id number]   Client is newly enrolled in the Special Needs Plan program with Type II Diabetes, last A1C was elevated at 8.9. Individualized Care Plan (ICP) completed with information from the Health Risk Assessment on file. Client also has a history of Hypertension. No recent acute admissions or ED visits noted in the medical record since 2019. Will send an introductory letter with ICP to the primary provider and client, along with educational materials. Assigned RN Care Coordinator will follow up in 2 months.

## 2019-08-19 DIAGNOSIS — Z79899 Other long term (current) drug therapy: Secondary | ICD-10-CM | POA: Diagnosis not present

## 2019-08-19 DIAGNOSIS — R635 Abnormal weight gain: Secondary | ICD-10-CM | POA: Diagnosis not present

## 2019-08-29 ENCOUNTER — Other Ambulatory Visit: Payer: Self-pay | Admitting: Internal Medicine

## 2019-08-29 NOTE — Telephone Encounter (Signed)
Please refill as per office routine med refill policy (all routine meds refilled for 3 mo or monthly per pt preference up to one year from last visit, then month to month grace period for 3 mo, then further med refills will have to be denied)  

## 2019-09-15 ENCOUNTER — Ambulatory Visit: Payer: Self-pay

## 2019-09-25 ENCOUNTER — Ambulatory Visit: Payer: Self-pay

## 2019-09-28 ENCOUNTER — Other Ambulatory Visit: Payer: Self-pay

## 2019-09-28 ENCOUNTER — Ambulatory Visit: Payer: Self-pay

## 2019-09-28 NOTE — Patient Outreach (Signed)
  [facility]) Care Management Chronic Special Needs Program    09/28/2019  Name: [name] [name], DOB: Jul 10, 1945  MRN: [id number]   [name] [name] is enrolled in a chronic special needs plan for Diabetes. Telephone call to client for Health risk assessment review and initial outreach. Unable to reach. HIPAA compliant voice message left with call back phone number and return call request.   PLAN; RNCM will attempt 2nd telephone call to client in 2 weeks.   Quinn Plowman RN,BSN,CCM [facility] Care Management [phone]

## 2019-09-29 ENCOUNTER — Other Ambulatory Visit: Payer: Self-pay

## 2019-09-29 NOTE — Patient Outreach (Signed)
  [facility]) Care Management Chronic Special Needs Program    09/29/2019  Name: [name] [name], DOB: 1945-05-11  MRN: [id number]   [name] is enrolled in a chronic special needs plan for Diabetes.Second telephone call to client for Health risk assessment review/ initial outreach. Unable to reach. HIPAA compliant voice message left with call back phone number and return call request.   PLAN; RNCM will attempt 3rd telephone call to client in 2 weeks.   [name] Plowman RN,BSN,CCM [facility] Care Management [phone]

## 2019-10-01 ENCOUNTER — Ambulatory Visit: Payer: Self-pay

## 2019-10-01 ENCOUNTER — Other Ambulatory Visit: Payer: Self-pay

## 2019-10-01 NOTE — Patient Outreach (Signed)
[place] [facility]) Care Management Chronic Special Needs Program  10/01/2019  Name: [name] [name] DOB: 1945/05/27  MRN: [id number]  [name] is enrolled in a chronic special needs plan for Diabetes. Chronic Care Management Coordinator telephoned client to review health risk assessment and to develop individualized care plan. HIPAA verified.   Introduced the chronic care management program, importance of client participation, and taking their care plan to all provider appointments and inpatient facilities.   Client reports her next follow up visit with her primary care provider is 11/04/19. She reports she has transportation to her appointments and she  is independent in her care.  Client reports she is able to afford her medications and takes them as prescribed. Client reports she checks her blood sugars regularly with her free style libre. She denies having any low blood sugar or symptoms.  Goals Addressed            This Visit's Progress    COMPLETED:  Acknowledge receipt of Haematologist sending Emmi education on Advanced Directives. Review and discuss with RN during telephone calls. Client declined Advanced Directive packet      COMPLETED: Advanced Care Planning complete by December 2021.       Client declined Advanced Directive packet     Client states she is working to bring her Hgb A1c down.       Take your medication as prescribe.  Incorporate exercise if you are able into your daily routine. (as recommended by your doctor)  Check blood sugars daily before eating with a goal of 80-130. You can also check  1 1/2 hours after eating with goal of 180 or less. Plan to eat low carbohydrate and low salt meals, watch portion sizes and avoid sugar sweetened drinks     HEMOGLOBIN A1C < 7       Your last documented A1C is 8.9.  Have your A1C checked every 6 months if you are at goal or every 3 months if you are not at goal. [facility] education sent  on Diabetic Meal Planning, review and discuss with RN. Check blood sugars daily before eating with a goal of 80-130. You can also check  1 1/2 hours after eating with goal of 180 or less. Plan to eat low carbohydrate and low salt meals, watch portion sizes and avoid sugar sweetened drinks. Discussed Carbohydrate controlled meals. Review [employer] calendar sent in the mail for Diabetes Action Plan.  Reviewed signs and symptoms of Hyperglycemia (high blood sugar) and hypoglycemia (low blood sugar) and actions to take. Review [employer] calendar sent in the mail for Diabetes Action Plan     Maintain timely refills of diabetic medication as prescribed within the year .       Client reports maintaining timely refills of diabetic medications.  Continue to take your medication as prescribed.  Contact your RN case manager if you need assistance obtaining your medication     COMPLETED: Obtain annual  Lipid Profile, LDL-C       Annual lipid profile complete 05/01/19     COMPLETED: Obtain Annual Eye (retinal)  Exam        Client reports her last eye exam was 06/23/19. Client reports next scheduled eye exam is 12/21/19     Obtain Annual Foot Exam       Last documented foot exam 10/29/18 It is important that your doctor check your feet regularly.  Discuss this with  your doctor at your next visit.   Diabetes can affect the nerves in your feet, causing decreased feeling or numbness. Diabetes foot care - Check feet daily at home (look for skin color changes, cuts, sores or cracks in the skin, swelling of feet or ankles, ingrown or fungal toenails, corn or calluses). Report these findings to your doctor - Wash feet with soap and water, dry feet well especially between toes - Moisturize your feet but not between the toes - Always wear shoes that protect your whole feet.       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      Visit Primary Care Provider or Endocrinologist at least 2 times per year        Primary care provider visits 10/29/18, 05/04/19 Client reports her next scheduled primary care visit is 11/04/19 Plan to schedule an annual wellness exam and follow up visits with your provider.        Plan:  Send successful outreach letter with a copy of their individualized care plan, Send individual care plan to provider and Send educational material  Chronic care management coordination will outreach in:  12 months    Quinn Plowman RN,BSN,CCM [place] [facility] [phone]

## 2019-10-05 ENCOUNTER — Encounter (HOSPITAL_COMMUNITY): Payer: Self-pay

## 2019-10-05 ENCOUNTER — Other Ambulatory Visit: Payer: Self-pay

## 2019-10-05 DIAGNOSIS — I10 Essential (primary) hypertension: Secondary | ICD-10-CM | POA: Diagnosis not present

## 2019-10-05 DIAGNOSIS — K5790 Diverticulosis of intestine, part unspecified, without perforation or abscess without bleeding: Secondary | ICD-10-CM | POA: Diagnosis not present

## 2019-10-05 DIAGNOSIS — R1032 Left lower quadrant pain: Secondary | ICD-10-CM | POA: Diagnosis present

## 2019-10-05 DIAGNOSIS — K5732 Diverticulitis of large intestine without perforation or abscess without bleeding: Secondary | ICD-10-CM | POA: Diagnosis not present

## 2019-10-05 DIAGNOSIS — E119 Type 2 diabetes mellitus without complications: Secondary | ICD-10-CM | POA: Insufficient documentation

## 2019-10-05 DIAGNOSIS — R52 Pain, unspecified: Secondary | ICD-10-CM | POA: Diagnosis not present

## 2019-10-05 DIAGNOSIS — K5792 Diverticulitis of intestine, part unspecified, without perforation or abscess without bleeding: Secondary | ICD-10-CM | POA: Diagnosis not present

## 2019-10-05 DIAGNOSIS — M48061 Spinal stenosis, lumbar region without neurogenic claudication: Secondary | ICD-10-CM | POA: Diagnosis not present

## 2019-10-05 DIAGNOSIS — R1084 Generalized abdominal pain: Secondary | ICD-10-CM | POA: Diagnosis not present

## 2019-10-05 DIAGNOSIS — R0902 Hypoxemia: Secondary | ICD-10-CM | POA: Diagnosis not present

## 2019-10-05 DIAGNOSIS — Z96653 Presence of artificial knee joint, bilateral: Secondary | ICD-10-CM | POA: Diagnosis not present

## 2019-10-05 DIAGNOSIS — I959 Hypotension, unspecified: Secondary | ICD-10-CM | POA: Diagnosis not present

## 2019-10-05 DIAGNOSIS — R110 Nausea: Secondary | ICD-10-CM | POA: Diagnosis not present

## 2019-10-05 DIAGNOSIS — K432 Incisional hernia without obstruction or gangrene: Secondary | ICD-10-CM | POA: Diagnosis not present

## 2019-10-05 NOTE — ED Triage Notes (Signed)
Patient arrived with complaints of left lower abdominal pain over the last four hours. Reports some nausea but no vomiting. Reports taking Pepto Bismol with no relief.

## 2019-10-06 ENCOUNTER — Emergency Department (HOSPITAL_COMMUNITY): Payer: HMO

## 2019-10-06 ENCOUNTER — Emergency Department (HOSPITAL_COMMUNITY)
Admission: EM | Admit: 2019-10-06 | Discharge: 2019-10-06 | Disposition: A | Discharge status: Home / Self Care | Payer: HMO | Attending: Emergency Medicine | Admitting: Emergency Medicine

## 2019-10-06 DIAGNOSIS — K5732 Diverticulitis of large intestine without perforation or abscess without bleeding: Secondary | ICD-10-CM | POA: Diagnosis not present

## 2019-10-06 DIAGNOSIS — K5792 Diverticulitis of intestine, part unspecified, without perforation or abscess without bleeding: Secondary | ICD-10-CM

## 2019-10-06 DIAGNOSIS — M48061 Spinal stenosis, lumbar region without neurogenic claudication: Secondary | ICD-10-CM | POA: Diagnosis not present

## 2019-10-06 DIAGNOSIS — K432 Incisional hernia without obstruction or gangrene: Secondary | ICD-10-CM | POA: Diagnosis not present

## 2019-10-06 DIAGNOSIS — K5790 Diverticulosis of intestine, part unspecified, without perforation or abscess without bleeding: Secondary | ICD-10-CM | POA: Diagnosis not present

## 2019-10-06 LAB — CBC
HCT: 44.2 % (ref 36.0–46.0)
Hemoglobin: 15 g/dL (ref 12.0–15.0)
MCH: 31 pg (ref 26.0–34.0)
MCHC: 33.9 g/dL (ref 30.0–36.0)
MCV: 91.3 fL (ref 80.0–100.0)
Platelets: 311 10*3/uL (ref 150–400)
RBC: 4.84 MIL/uL (ref 3.87–5.11)
RDW: 12.3 % (ref 11.5–15.5)
WBC: 18.5 10*3/uL — ABNORMAL HIGH (ref 4.0–10.5)
nRBC: 0 % (ref 0.0–0.2)

## 2019-10-06 LAB — COMPREHENSIVE METABOLIC PANEL
ALT: 21 U/L (ref 0–44)
AST: 21 U/L (ref 15–41)
Albumin: 4 g/dL (ref 3.5–5.0)
Alkaline Phosphatase: 62 U/L (ref 38–126)
Anion gap: 12 (ref 5–15)
BUN: 20 mg/dL (ref 8–23)
CO2: 18 mmol/L — ABNORMAL LOW (ref 22–32)
Calcium: 8.7 mg/dL — ABNORMAL LOW (ref 8.9–10.3)
Chloride: 105 mmol/L (ref 98–111)
Creatinine, Ser: 0.71 mg/dL (ref 0.44–1.00)
GFR calc Af Amer: >60 mL/min (ref >60)
GFR calc non Af Amer: >60 mL/min (ref >60)
Glucose, Bld: 244 mg/dL — ABNORMAL HIGH (ref 70–99)
Potassium: 4.4 mmol/L (ref 3.5–5.1)
Sodium: 135 mmol/L (ref 135–145)
Total Bilirubin: 1 mg/dL (ref 0.3–1.2)
Total Protein: 6.8 g/dL (ref 6.5–8.1)

## 2019-10-06 LAB — URINALYSIS, ROUTINE W REFLEX MICROSCOPIC
Bacteria, UA: NONE SEEN
Bilirubin Urine: NEGATIVE
Glucose, UA: NEGATIVE mg/dL
Hgb urine dipstick: NEGATIVE
Ketones, ur: NEGATIVE mg/dL
Nitrite: NEGATIVE
Protein, ur: NEGATIVE mg/dL
Specific Gravity, Urine: 1.024 (ref 1.005–1.030)
pH: 5 (ref 5.0–8.0)

## 2019-10-06 LAB — LIPASE, BLOOD: Lipase: 37 U/L (ref 11–51)

## 2019-10-06 MED ORDER — AMOXICILLIN-POT CLAVULANATE 875-125 MG PO TABS: 1.0000 | ORAL_TABLET | Freq: Two times a day (BID) | ORAL | 0 refills | Status: AC

## 2019-10-06 MED ORDER — ONDANSETRON 4 MG PO TBDP: 4.0000 mg | ORAL_TABLET | Freq: Three times a day (TID) | ORAL | 0 refills | Status: DC | PRN

## 2019-10-06 NOTE — ED Provider Notes (Signed)
Emergency Department Provider Note   I have reviewed the triage vital signs and the nursing notes.   HISTORY  Chief Complaint Abdominal Pain   HPI [name] [name] is a 73 y.o. female with past medical history reviewed below presents to the emergency department with left lower abdominal pain starting over the past 12 hours.  Patient tried taking Pepto-Bismol with no relief.  She has had some associated nausea but no vomiting.  Denies diarrhea.  No prior history of diverticulitis although has known diverticulosis from a prior colonoscopy.  She is not experiencing any fever or shaking chills.  Denies any dysuria, hesitancy, urgency.  No vaginal bleeding or discharge.  No radiation of symptoms or other modifying factors.   Past Medical History:  Diagnosis Date  . DIVERTICULOSIS, COLON 09/10/2007  . DM 03/26/2008  . Glaucoma 03/17/2011  . HYPERLIPIDEMIA 09/10/2007  . HYPERTENSION 09/10/2007  . Hypertension   . Osteoarthritis 10/21/2011  . Shingles 03/17/2011    Patient Active Problem List   Diagnosis Date Noted  . Wellness examination 05/11/2017  . Cough 05/11/2017  . 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 OA (osteoarthritis) of knee 08/17/2015  . 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 Bilateral knee pain 07/06/2015  . Osteoarthritis 10/21/2011  . Right-sided sensorineural hearing loss 10/21/2011  . Glaucoma 03/17/2011  . Shingles 03/17/2011  . MENOPAUSAL DISORDER 12/02/2008  . Diabetes ([place]) 03/26/2008  . Hyperlipidemia 09/10/2007  . Essential hypertension 09/10/2007  . DIVERTICULOSIS, COLON 09/10/2007  . EPISTAXIS, RECURRENT 09/10/2007    Past Surgical History:  Procedure Laterality Date  . ABDOMINAL HYSTERECTOMY    . HEMATOMA EVACUATION     2003; abdominal GI bleed 2nd-ary to Aspirin consumption  . 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 TOTAL KNEE ARTHROPLASTY Right 03/11/2017   Procedure: RIGHT TOTAL KNEE ARTHROPLASTY;  Surgeon: Gaynelle Arabian, MD;  Location: [facility];  Service: Orthopedics;  Laterality: Right;  . TUBAL LIGATION      Allergies Ivp dye [iodinated diagnostic agents] and Sulfamethoxazole  Family History  Problem Relation Age of Onset  . Hypertension Mother   . Hypertension Father   . Colon cancer Neg Hx     Social History Social History   Tobacco Use  . Smoking status: Never Smoker  . Smokeless tobacco: Never Used  Vaping Use  . Vaping Use: Never used  Substance Use Topics  . Alcohol use: Yes    Comment: occ: social  . Drug use: No    Review of Systems  Constitutional: No fever/chills Eyes: No visual changes. ENT: No sore throat. Cardiovascular: Denies chest pain. Respiratory: Denies shortness of breath. Gastrointestinal: Positive LLQ abdominal pain. Positive nausea, no vomiting.  No diarrhea.  No constipation. Genitourinary: Negative for dysuria. Musculoskeletal: Negative for back pain. Skin: Negative for rash. Neurological: Negative for headaches, focal weakness or numbness.  10-point ROS otherwise negative.  ____________________________________________   PHYSICAL EXAM:  VITAL SIGNS: ED Triage Vitals  Enc Vitals Group     BP 10/05/19 2350 (!) 141/83     Pulse Rate 10/05/19 2350 97     Resp 10/05/19 2350 16     Temp 10/05/19 2350 99.1 F (37.3 C)     Temp Source 10/05/19 2350 Oral     SpO2 10/05/19 2350 99 %     Weight 10/05/19 2350 190 lb (86.2 kg)  Height 10/05/19 2350 5\' 7"  (1.702 m)   Constitutional: Alert and oriented. Well appearing and in no acute distress. Eyes: Conjunctivae are normal.  Head: Atraumatic. Nose: No congestion/rhinnorhea. Mouth/Throat: Mucous membranes are moist.  Neck: No stridor.  Cardiovascular: Normal rate, regular rhythm. Good peripheral circulation. Grossly normal heart sounds.   Respiratory: Normal respiratory effort.   No retractions. Lungs CTAB. Gastrointestinal: Soft with focal LLQ tenderness. No rebound or guarding. No distention.  Musculoskeletal: No gross deformities of extremities. Neurologic:  Normal speech and language. Skin:  Skin is warm, dry and intact. No rash noted.   ____________________________________________   LABS (all labs ordered are listed, but only abnormal results are displayed)  Labs Reviewed  COMPREHENSIVE METABOLIC PANEL - Abnormal; Notable for the following components:      Result Value   CO2 18 (*)    Glucose, Bld 244 (*)    Calcium 8.7 (*)    All other components within normal limits  CBC - Abnormal; Notable for the following components:   WBC 18.5 (*)    All other components within normal limits  URINALYSIS, ROUTINE W REFLEX MICROSCOPIC - Abnormal; Notable for the following components:   Leukocytes,Ua MODERATE (*)    All other components within normal limits  LIPASE, BLOOD   ____________________________________________  RADIOLOGY  CT ABDOMEN PELVIS WO CONTRAST  Result Date: 10/06/2019 CLINICAL DATA:  Nausea and left lower quadrant pain with leukocytosis. EXAM: CT ABDOMEN AND PELVIS WITHOUT CONTRAST TECHNIQUE: Multidetector CT imaging of the abdomen and pelvis was performed following the standard protocol without IV contrast. COMPARISON:  None available FINDINGS: Lower chest:  No contributory findings. Hepatobiliary: No focal liver abnormality.No evidence of biliary obstruction or stone. Pancreas: Unremarkable. Spleen: Unremarkable. Adrenals/Urinary Tract: Negative adrenals. No hydronephrosis or stone. Unremarkable bladder. Stomach/Bowel: Numerous left colonic diverticula with thickened distal descending diverticulum associated with regional mesenteric edema. No pneumoperitoneum or evident fluid collection. Negative for appendicitis. Multiple clips around the right upper quadrant. Proximal small bowel diverticula are present. Vascular/Lymphatic: No acute vascular  abnormality. No mass or adenopathy. Reproductive:Hysterectomy Other: No ascites or pneumoperitoneum. Two fatty midline incisional hernias. Musculoskeletal: No acute abnormalities. Spine straightening with L3-4 to L5-S1 advanced degeneration. High-grade spinal and foramina stenosis is present at L3-4. IMPRESSION: 1. Diverticulitis at the descending sigmoid junction. No fluid collection or pneumoperitoneum. 2. Fatty midline incisional hernias. Electronically Signed   By: Monte Fantasia M.D.   On: 10/06/2019 08:00    ____________________________________________   PROCEDURES  Procedure(s) performed:   Procedures  None  ____________________________________________   INITIAL IMPRESSION / ASSESSMENT AND PLAN / ED COURSE  Pertinent labs & imaging results that were available during my care of the patient were reviewed by me and considered in my medical decision making (see chart for details).   Patient presents emergency department for evaluation of left lower quadrant abdominal pain.  Clinically my suspicion for diverticulitis is elevated.  Will obtain CT to confirm this and rule out complication.  Doubt aortic pathology or kidney stone.  Lab work shows leukocytosis to 18 but afebrile here in no findings on exam to suspect sepsis. Patient has allergy to IV contrast and so initial scan will be without.   CT reviewed showing diverticulitis.  Patient is having no pain at this time and does not wish for pain medicines at home.  She had some mild nausea yesterday and so will discharge with Zofran.  Plan for Augmentin for the next 10 days.  Patient to follow with her PCP and follow-up  with her gastroenterologist for reevaluation once symptoms have resolved.  Discussed ED return precautions and provided in writing as well. ____________________________________________  FINAL CLINICAL IMPRESSION(S) / ED DIAGNOSES  Final diagnoses:  Diverticulitis    NEW OUTPATIENT MEDICATIONS STARTED DURING THIS  VISIT:  New Prescriptions   AMOXICILLIN-CLAVULANATE (AUGMENTIN) 875-125 MG TABLET    Take 1 tablet by mouth every 12 (twelve) hours for 10 days.   ONDANSETRON (ZOFRAN ODT) 4 MG DISINTEGRATING TABLET    Take 1 tablet (4 mg total) by mouth every 8 (eight) hours as needed for nausea or vomiting.    Note:  This document was prepared using Dragon voice recognition software and may include unintentional dictation errors.  Nanda Quinton, MD, [facility]    Mahonri Seiden, [facility], MD 10/06/19 [phone]

## 2019-10-06 NOTE — Discharge Instructions (Signed)
You were seen in the emergency department today with abdominal pain.  We found evidence of diverticulitis and are starting on antibiotics.  Please follow closely with your primary care doctor.  They can refer you to a gastroenterologist for follow-up after your symptoms improved for consideration of colonoscopy.  Please take the complete course of antibiotics even if you are feeling better sooner.  Return to the emergency department any fever, worsening abdominal pain, weakness, vomiting and inability to take your antibiotics.

## 2019-10-27 ENCOUNTER — Other Ambulatory Visit (INDEPENDENT_AMBULATORY_CARE_PROVIDER_SITE_OTHER): Payer: HMO

## 2019-10-27 ENCOUNTER — Other Ambulatory Visit: Payer: Self-pay

## 2019-10-27 DIAGNOSIS — E119 Type 2 diabetes mellitus without complications: Secondary | ICD-10-CM | POA: Diagnosis not present

## 2019-10-27 LAB — BASIC METABOLIC PANEL
BUN: 22 mg/dL (ref 6–23)
CO2: 24 mEq/L (ref 19–32)
Calcium: 9 mg/dL (ref 8.4–10.5)
Chloride: 103 mEq/L (ref 96–112)
Creatinine, Ser: 0.81 mg/dL (ref 0.40–1.20)
GFR: 68.76 mL/min (ref >60.00)
Glucose, Bld: 165 mg/dL — ABNORMAL HIGH (ref 70–99)
Potassium: 4.4 mEq/L (ref 3.5–5.1)
Sodium: 136 mEq/L (ref 135–145)

## 2019-10-27 LAB — HEPATIC FUNCTION PANEL
ALT: 20 U/L (ref 0–35)
AST: 24 U/L (ref 0–37)
Albumin: 4 g/dL (ref 3.5–5.2)
Alkaline Phosphatase: 66 U/L (ref 39–117)
Bilirubin, Direct: 0.1 mg/dL (ref 0.0–0.3)
Total Bilirubin: 1 mg/dL (ref 0.2–1.2)
Total Protein: 6.6 g/dL (ref 6.0–8.3)

## 2019-10-27 LAB — LIPID PANEL
Cholesterol: 127 mg/dL (ref 0–200)
HDL: 45.6 mg/dL (ref >39.00)
LDL Cholesterol: 44 mg/dL (ref 0–99)
NonHDL: 80.95
Total CHOL/HDL Ratio: 3
Triglycerides: 187 mg/dL — ABNORMAL HIGH (ref 0.0–149.0)
VLDL: 37.4 mg/dL (ref 0.0–40.0)

## 2019-10-27 LAB — HEMOGLOBIN A1C: Hgb A1c MFr Bld: 7.6 % — ABNORMAL HIGH (ref 4.6–6.5)

## 2019-10-29 ENCOUNTER — Other Ambulatory Visit: Payer: Self-pay | Admitting: Internal Medicine

## 2019-10-29 DIAGNOSIS — Z1231 Encounter for screening mammogram for malignant neoplasm of breast: Secondary | ICD-10-CM

## 2019-11-04 ENCOUNTER — Encounter: Payer: Self-pay | Admitting: Internal Medicine

## 2019-11-04 ENCOUNTER — Ambulatory Visit
Admission: RE | Admit: 2019-11-04 | Discharge: 2019-11-04 | Disposition: A | Discharge status: Home / Self Care | Payer: HMO | Source: Ambulatory Visit | Attending: Internal Medicine | Admitting: Internal Medicine

## 2019-11-04 ENCOUNTER — Other Ambulatory Visit: Payer: Self-pay

## 2019-11-04 ENCOUNTER — Ambulatory Visit (INDEPENDENT_AMBULATORY_CARE_PROVIDER_SITE_OTHER): Payer: HMO | Admitting: Internal Medicine

## 2019-11-04 VITALS — BP 126/68 | HR 79 | Temp 98.7°F | Ht 67.0 in | Wt 190.0 lb

## 2019-11-04 DIAGNOSIS — I10 Essential (primary) hypertension: Secondary | ICD-10-CM | POA: Diagnosis not present

## 2019-11-04 DIAGNOSIS — E785 Hyperlipidemia, unspecified: Secondary | ICD-10-CM | POA: Diagnosis not present

## 2019-11-04 DIAGNOSIS — K5730 Diverticulosis of large intestine without perforation or abscess without bleeding: Secondary | ICD-10-CM

## 2019-11-04 DIAGNOSIS — Z23 Encounter for immunization: Secondary | ICD-10-CM

## 2019-11-04 DIAGNOSIS — E119 Type 2 diabetes mellitus without complications: Secondary | ICD-10-CM

## 2019-11-04 DIAGNOSIS — E559 Vitamin D deficiency, unspecified: Secondary | ICD-10-CM | POA: Diagnosis not present

## 2019-11-04 DIAGNOSIS — E538 Deficiency of other specified B group vitamins: Secondary | ICD-10-CM | POA: Diagnosis not present

## 2019-11-04 DIAGNOSIS — Z1231 Encounter for screening mammogram for malignant neoplasm of breast: Secondary | ICD-10-CM

## 2019-11-04 MED ORDER — TRULICITY 1.5 MG/0.5ML ~~LOC~~ SOAJ: 1.5000 mg | SUBCUTANEOUS | 3 refills | Status: DC

## 2019-11-04 NOTE — Progress Notes (Signed)
Subjective:    Patient ID: [name] [name], female    DOB: April 04, 1945, 74 y.o.   MRN: [id number]  HPI  Here to f/u; overall doing ok,  Pt denies chest pain, increasing sob or doe, wheezing, orthopnea, PND, increased LE swelling, palpitations, dizziness or syncope.  Pt denies new neurological symptoms such as new headache, or facial or extremity weakness or numbness.  Pt denies polydipsia, polyuria, or low sugar episode.  Pt states overall good compliance with meds, mostly trying to follow appropriate diet, with wt overall down - Lost 14 lbs with trulicity and better diet.   Wt Readings from Last 3 Encounters:  11/04/19 190 lb (86.2 kg)  10/05/19 190 lb (86.2 kg)  05/04/19 204 lb 6.4 oz (92.7 kg)  Denies worsening reflux, abd pain, dysphagia, n/v, bowel change or blood, had recent episode diverticulitis, to f/u GI oct 22 Past Medical History:  Diagnosis Date  . DIVERTICULOSIS, COLON 09/10/2007  . DM 03/26/2008  . Glaucoma 03/17/2011  . HYPERLIPIDEMIA 09/10/2007  . HYPERTENSION 09/10/2007  . Hypertension   . Osteoarthritis 10/21/2011  . Shingles 03/17/2011   Past Surgical History:  Procedure Laterality Date  . ABDOMINAL HYSTERECTOMY    . HEMATOMA EVACUATION     2003; abdominal GI bleed 2nd-ary to Aspirin consumption  . 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 TOTAL KNEE ARTHROPLASTY Right 03/11/2017   Procedure: RIGHT TOTAL KNEE ARTHROPLASTY;  Surgeon: Gaynelle Arabian, MD;  Location: [facility];  Service: Orthopedics;  Laterality: Right;  . TUBAL LIGATION      reports that she has never smoked. She has never used smokeless tobacco. She reports current alcohol use. She reports that she does not use drugs. family history includes Hypertension in her father and mother. Allergies  Allergen Reactions  . Ivp Dye [Iodinated Diagnostic Agents]   .  Sulfamethoxazole     REACTION: unspecified   Current Outpatient Medications on File Prior to Visit  Medication Sig Dispense Refill  . bimatoprost (LUMIGAN) 0.01 % SOLN Place 1 drop into both eyes at bedtime.     . Continuous Blood Gluc Receiver (FREESTYLE LIBRE 14 DAY READER) DEVI 1 Device by Does not apply route daily as needed. E11.9 1 each 1  . Continuous Blood Gluc Sensor (FREESTYLE LIBRE 14 DAY SENSOR) MISC 1 Device by Does not apply route every 14 (fourteen) days. E11.9 6 each 3  . Lancet Device MISC Use as directed three times daily E11.9 300 each 3  . losartan (COZAAR) 100 MG tablet TAKE ONE TABLET BY MOUTH DAILY 90 tablet 2  . metFORMIN (GLUCOPHAGE) 500 MG tablet TAKE 2 TABLETS BY MOUTH TWO TIMES A DAY WITH A MEAL 360 tablet 2  . ondansetron (ZOFRAN ODT) 4 MG disintegrating tablet Take 1 tablet (4 mg total) by mouth every 8 (eight) hours as needed for nausea or vomiting. 20 tablet 0  . simvastatin (ZOCOR) 40 MG tablet TAKE ONE TABLET BY MOUTH DAILY 90 tablet 2  . acetaminophen (TYLENOL) 500 MG tablet Take 1,000 mg by mouth every 8 (eight) hours as needed for mild pain or moderate pain.    . methocarbamol (ROBAXIN) 500 MG tablet Take 1 tablet (500 mg total) by mouth every 6 (six) hours as needed for muscle spasms. 60 tablet 0  . oxyCODONE (OXY IR/ROXICODONE) 5 MG immediate release tablet Take 1 tablet (  5 mg total) by mouth every 4 (four) hours as needed for moderate pain or severe pain. 40 tablet 0  . rivaroxaban (XARELTO) 10 MG TABS tablet Take 1 tablet (10 mg total) by mouth daily with breakfast. Take Xarelto for two and a half more weeks following discharge from the hospital, then discontinue Xarelto. Once the patient has completed the blood thinner regimen, then take a Baby 81 mg Aspirin daily for three more weeks. 20 tablet 0  . traMADol (ULTRAM) 50 MG tablet Take 1-2 tablets (50-100 mg total) by mouth every 6 (six) hours as needed (mild pain). 56 tablet 0   No current  facility-administered medications on file prior to visit.    Review of Systems All otherwise neg per pt    Objective:   Physical Exam BP 126/68 (BP Location: Left Arm, Patient Position: Sitting, Cuff Size: Normal)   Pulse 79   Temp 98.7 F (37.1 C) (Oral)   Ht 5\' 7"  (1.702 m)   Wt 190 lb (86.2 kg)   SpO2 98%   BMI 29.76 kg/m  VS noted,  Constitutional: Pt appears in NAD HENT: Head: NCAT.  Right Ear: External ear normal.  Left Ear: External ear normal.  Eyes: . Pupils are equal, round, and reactive to light. Conjunctivae and EOM are normal Nose: without d/c or deformity Neck: Neck supple. Gross normal ROM Cardiovascular: Normal rate and regular rhythm.   Pulmonary/Chest: Effort normal and breath sounds without rales or wheezing.  Abd:  Soft, NT, ND, + BS, no organomegaly Neurological: Pt is alert. At baseline orientation, motor grossly intact Skin: Skin is warm. No rashes, other new lesions, no LE edema Psychiatric: Pt behavior is normal without agitation  All otherwise neg per pt Lab Results  Component Value Date   WBC 18.5 (H) 10/06/2019   HGB 15.0 10/06/2019   HCT 44.2 10/06/2019   PLT 311 10/06/2019   GLUCOSE 165 (H) 10/27/2019   CHOL 127 10/27/2019   TRIG 187.0 (H) 10/27/2019   HDL 45.60 10/27/2019   LDLDIRECT 65.5 03/24/2012   LDLCALC 44 10/27/2019   ALT 20 10/27/2019   AST 24 10/27/2019   NA 136 10/27/2019   K 4.4 10/27/2019   CL 103 10/27/2019   CREATININE 0.81 10/27/2019   BUN 22 10/27/2019   CO2 24 10/27/2019   TSH 3.71 05/01/2019   INR 0.93 03/06/2017   HGBA1C 7.6 (H) 10/27/2019   MICROALBUR 1.3 05/01/2019      Assessment & Plan:

## 2019-11-04 NOTE — Patient Instructions (Addendum)
You had the flu shot today  Please have your covid booster as you mentioned soon  Ok to increase the trulicity to the 1.5 mg dose  Please continue all other medications as before, and refills have been done if requested.  Please have the pharmacy call with any other refills you may need.  Please continue your efforts at being more active, low cholesterol diet, and weight control..  Please keep your appointments with your specialists as you may have planned - Dr Fuller Plan Oct 22  Please make an Appointment to return in 6 months, or sooner if needed, also with Lab Appointment for testing done 3-5 days before at the [facility] (so this is for TWO appointments - please see the scheduling desk as you leave)

## 2019-11-08 ENCOUNTER — Encounter: Payer: Self-pay | Admitting: Internal Medicine

## 2019-11-08 NOTE — Assessment & Plan Note (Signed)
stable overall by history and exam, recent data reviewed with pt, and pt to continue medical treatment as before,  to f/u any worsening symptoms or concerns  

## 2019-11-08 NOTE — Assessment & Plan Note (Signed)
With recent diverticulitis , for GI f/u as planned

## 2019-11-08 NOTE — Assessment & Plan Note (Addendum)
Mild uncontrolled, for increased trulicity 1.5 gm, refer DM education, o/w stable overall by history and exam, recent data reviewed with pt, and pt to continue medical treatment as before,  to f/u any worsening symptoms or concerns  I spent 31 minutes in preparing to see the patient by review of recent labs, imaging and procedures, obtaining and reviewing separately obtained history, communicating with the patient and family or caregiver, ordering medications, tests or procedures, and documenting clinical information in the EHR including the differential Dx, treatment, and any further evaluation and other management of dm, htn, hld, diverticulitis

## 2019-11-23 ENCOUNTER — Ambulatory Visit: Payer: HMO | Admitting: Physician Assistant

## 2019-11-23 NOTE — Telephone Encounter (Signed)
I contacted the patient and offered an appt for today at 3:00.  Patient seen here in 2017 for screening procedure only.  She wants to keep her appt with Dr. Fuller Plan for Friday.  She took Tylenol last night and feeling better.  She will start on liquids today.

## 2019-11-27 ENCOUNTER — Other Ambulatory Visit: Payer: Self-pay | Admitting: Internal Medicine

## 2019-11-27 ENCOUNTER — Encounter: Payer: Self-pay | Admitting: Gastroenterology

## 2019-11-27 ENCOUNTER — Ambulatory Visit: Payer: HMO | Admitting: Gastroenterology

## 2019-11-27 VITALS — BP 128/82 | HR 88 | Ht 65.5 in | Wt 189.2 lb

## 2019-11-27 DIAGNOSIS — R1030 Lower abdominal pain, unspecified: Secondary | ICD-10-CM

## 2019-11-27 DIAGNOSIS — K5730 Diverticulosis of large intestine without perforation or abscess without bleeding: Secondary | ICD-10-CM | POA: Diagnosis not present

## 2019-11-27 MED ORDER — AMOXICILLIN-POT CLAVULANATE 875-125 MG PO TABS: 1.0000 | ORAL_TABLET | Freq: Two times a day (BID) | ORAL | 0 refills | Status: AC

## 2019-11-27 NOTE — Progress Notes (Signed)
History of Present Illness: This is a 74 year old female referred by Biagio Borg, MD for the evaluation of lower abdominal pain.  She was evaluated in the emergency department on August 31 for lower abdominal pain.  CT scan findings below.  She was treated with a course of Augmentin and her symptoms resolved after about 3 to 4 days.  She has had difficulties with constipation intermittently for the past several months.  On Monday she had the acute onset of lower abdominal pain and was concerned she had recurrent diverticulitis.  She has not had a bowel movement for several days.  She started herself on a liquid diet and her symptoms have improved.  She does not note fevers or chills.  CT AP 10/06/2019 IMPRESSION: 1. Diverticulitis at the descending sigmoid junction. No fluid collection or pneumoperitoneum. 2. Fatty midline incisional hernias.  Colonoscopy 09/2015 - One 5 mm polyp in the transverse colon, removed with a cold biopsy forceps. Resected and retrieved. - The examination was otherwise normal on direct and retroflexion views. - Moderate diverticulosis in the sigmoid colon and in the descending colon.    Allergies  Allergen Reactions   Ivp Dye [Iodinated Diagnostic Agents]    Sulfamethoxazole     REACTION: unspecified   Outpatient Medications Prior to Visit  Medication Sig Dispense Refill   acetaminophen (TYLENOL) 500 MG tablet Take 1,000 mg by mouth every 8 (eight) hours as needed for mild pain or moderate pain.     bimatoprost (LUMIGAN) 0.01 % SOLN Place 1 drop into both eyes at bedtime.      Continuous Blood Gluc Receiver (FREESTYLE LIBRE 14 DAY READER) DEVI 1 Device by Does not apply route daily as needed. E11.9 1 each 1   Continuous Blood Gluc Sensor (FREESTYLE LIBRE 14 DAY SENSOR) MISC 1 Device by Does not apply route every 14 (fourteen) days. E11.9 6 each 3   Dulaglutide (TRULICITY) 1.5 DU/2.0UR SOPN Inject 1.5 mg into the skin once a week. 6 mL 3   Lancet  Device MISC Use as directed three times daily E11.9 300 each 3   losartan (COZAAR) 100 MG tablet TAKE ONE TABLET BY MOUTH DAILY 90 tablet 2   metFORMIN (GLUCOPHAGE) 500 MG tablet TAKE 2 TABLETS BY MOUTH TWO TIMES A DAY WITH A MEAL 360 tablet 2   ondansetron (ZOFRAN ODT) 4 MG disintegrating tablet Take 1 tablet (4 mg total) by mouth every 8 (eight) hours as needed for nausea or vomiting. 20 tablet 0   simvastatin (ZOCOR) 40 MG tablet TAKE ONE TABLET BY MOUTH DAILY 90 tablet 2   methocarbamol (ROBAXIN) 500 MG tablet Take 1 tablet (500 mg total) by mouth every 6 (six) hours as needed for muscle spasms. 60 tablet 0   oxyCODONE (OXY IR/ROXICODONE) 5 MG immediate release tablet Take 1 tablet (5 mg total) by mouth every 4 (four) hours as needed for moderate pain or severe pain. 40 tablet 0   rivaroxaban (XARELTO) 10 MG TABS tablet Take 1 tablet (10 mg total) by mouth daily with breakfast. Take Xarelto for two and a half more weeks following discharge from the hospital, then discontinue Xarelto. Once the patient has completed the blood thinner regimen, then take a Baby 81 mg Aspirin daily for three more weeks. 20 tablet 0   traMADol (ULTRAM) 50 MG tablet Take 1-2 tablets (50-100 mg total) by mouth every 6 (six) hours as needed (mild pain). 56 tablet 0   No facility-administered medications prior to  visit.   Past Medical History:  Diagnosis Date   DIVERTICULOSIS, COLON 09/10/2007   DM 03/26/2008   Glaucoma 03/17/2011   HYPERLIPIDEMIA 09/10/2007   HYPERTENSION 09/10/2007   Hypertension    Osteoarthritis 10/21/2011   Shingles 03/17/2011   Past Surgical History:  Procedure Laterality Date   ABDOMINAL HYSTERECTOMY     HEMATOMA EVACUATION     2003; abdominal GI bleed 2nd-ary to Aspirin consumption   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   Procedure: LEFT TOTAL KNEE ARTHROPLASTY;  Surgeon: Gaynelle Arabian, MD;  Location: [facility];  Service: Orthopedics;  Laterality: Left;  with canal block    TOTAL KNEE ARTHROPLASTY Right 03/11/2017   Procedure: RIGHT TOTAL KNEE ARTHROPLASTY;  Surgeon: Gaynelle Arabian, MD;  Location: [facility];  Service: Orthopedics;  Laterality: Right;   TUBAL LIGATION     Social History   Socioeconomic History   Marital status: Married    Spouse name: Not on file   Number of children: Not on file   Years of education: Not on file   Highest education level: Not on file  Occupational History   Not on file  Tobacco Use   Smoking status: Never Smoker   Smokeless tobacco: Never Used  Vaping Use   Vaping Use: Never used  Substance and Sexual Activity   Alcohol use: Yes    Comment: occ: social   Drug use: No   Sexual activity: Not on file  Other Topics Concern   Not on file  Social History Narrative   Not on file   Social Determinants of Health   Financial Resource Strain:    Difficulty of Paying Living Expenses: Not on file  Food Insecurity: No Food Insecurity   Worried About Running Out of Food in the Last Year: Never true   [place]ili in the Last Year: Never true  Transportation Needs: No Transportation Needs   Lack of Transportation (Medical): No   Lack of Transportation (Non-Medical): No  Physical Activity:    Days of Exercise per Week: Not on file   Minutes of Exercise per Session: Not on file  Stress:    Feeling of Stress : Not on file  Social Connections:    Frequency of Communication with Friends and Family: Not on file   Frequency of Social Gatherings with Friends and Family: Not on file   Attends Religious Services: Not on file   Active Member of Clubs or Organizations: Not on file   Attends Archivist Meetings: Not on file   Marital Status: Not on file   Family History  Problem Relation Age of Onset   Hypertension Mother    Hypertension Father    Colon cancer Neg Hx      Review of Systems: Pertinent positive and negative review of systems were noted in the above HPI section. All  other review of systems were otherwise negative.   Physical Exam: General: Well developed, well nourished, no acute distress Head: Normocephalic and atraumatic Eyes:  sclerae anicteric, EOMI Ears: Normal auditory acuity Mouth: Not examined, mask on during Covid-19 pandemic Neck: Supple, no masses or thyromegaly Lungs: Clear throughout to auscultation Heart: Regular rate and rhythm; no murmurs, rubs or bruits Abdomen: Soft, mild lower abdominal tenderness and non distended. No masses, hepatosplenomegaly or hernias noted. Normal Bowel sounds Rectal: Not done Musculoskeletal: Symmetrical with no gross deformities  Skin: No lesions on visible extremities Pulses:  Normal pulses noted Extremities: No clubbing, cyanosis, edema or deformities noted Neurological: Alert  oriented x 4, grossly nonfocal Cervical Nodes:  No significant cervical adenopathy Inguinal Nodes: No significant inguinal adenopathy Psychological:  Alert and cooperative. Normal mood and affect   Assessment and Recommendations:  1. Lower abdominal pain and tenderness. Constipation vs diverticulitis with constipation.  Begin MiraLAX 1-2 scoops daily for management of constipation.  Continue liquid diet and advance as tolerated.  Augmentin 875 1 p.o. twice daily with food for 7 days.  She is advised to call if symptoms do not completely resolve with the above measures.  If symptoms persist will proceed with blood work and CT AP.  If symptoms are persistent or recurrent will schedule colonoscopy earlier than her surveillance interval as below.  2. Personal history of adenomatous colon polyps.  Surveillance colonoscopy is recommended in August 2022.  Long-term anticoagulation with Xarelto.   cc: Biagio Borg, MD [address] [place],  [place] [address]

## 2019-11-27 NOTE — Patient Instructions (Signed)
We have sent the following medications to your pharmacy for you to pick up at your convenience: Augmentin 875 mg one tablet by mouth twice daily x 7 days.  Start over the counter Miralax daily.   Call our office if your symptoms are not better.   Thank you for choosing me and [facility].  Pricilla Riffle. Dagoberto Ligas., MD., Marval Regal

## 2019-11-27 NOTE — Telephone Encounter (Signed)
Please refill as per office routine med refill policy (all routine meds refilled for 3 mo or monthly per pt preference up to one year from last visit, then month to month grace period for 3 mo, then further med refills will have to be denied)  

## 2019-12-07 ENCOUNTER — Encounter: Payer: HMO | Attending: Internal Medicine | Admitting: Skilled Nursing Facility1

## 2019-12-07 ENCOUNTER — Other Ambulatory Visit: Payer: Self-pay

## 2019-12-07 ENCOUNTER — Encounter: Payer: Self-pay | Admitting: Skilled Nursing Facility1

## 2019-12-07 DIAGNOSIS — E119 Type 2 diabetes mellitus without complications: Secondary | ICD-10-CM | POA: Diagnosis not present

## 2019-12-07 NOTE — Progress Notes (Signed)
Diabetes Self-Management Education  Visit Type: First/Initial  12/07/2019  [name], identified by name and date of birth, is a 74 y.o. female with a diagnosis of Diabetes: Type 2.   ASSESSMENT  Height 5' 5.5" (1.664 m), weight 188 lb 9.6 oz (85.5 kg). Body mass index is 30.91 kg/m.   Pt states she wants to discuss diverticulosis as well.  Pt states she had a flare up of diverticulitis about 6 weeks ago.  Pt states generally she has a bowel movement daily and does not have any issues with relfux, nausea, or bloat.  Pt states she tries to do as much plant based as possible. Pt states she does not eat beef or pork.  Pts A1C 7.6.  Pt states she has had diabetes for many years. Pt states she does have a libre so she checks often throughout the day.  Pt states she is discouraged sometimes by her blood sugars number: fasting: 150; 3pm around 100-115; not sure how long after eating she is checking but around 150-200  Goals: Only have 1 raw vegetable per day until more is tolerated Increase fiber intake Be sure to eat lunch Go for a 30 minute walk after dinner   Diabetes Self-Management Education - 12/07/19 1111      Visit Information   Visit Type First/Initial      Initial Visit   Diabetes Type Type 2    Are you currently following a meal plan? No    Are you taking your medications as prescribed? Yes      Health Coping   How would you rate your overall health? Fair      Psychosocial Assessment   Patient Belief/Attitude about Diabetes Motivated to manage diabetes    Self-care barriers None    Patient Concerns Nutrition/Meal planning;Weight Control    Special Needs None      Pre-Education Assessment   Patient understands the diabetes disease and treatment process. Needs Instruction    Patient understands incorporating nutritional management into lifestyle. Needs Instruction    Patient undertands incorporating physical activity into lifestyle. Needs Instruction     Patient understands using medications safely. Needs Instruction    Patient understands monitoring blood glucose, interpreting and using results Needs Instruction    Patient understands prevention, detection, and treatment of acute complications. Needs Instruction    Patient understands prevention, detection, and treatment of chronic complications. Needs Instruction    Patient understands how to develop strategies to address psychosocial issues. Needs Instruction    Patient understands how to develop strategies to promote health/change behavior. Needs Instruction      Complications   Last HgB A1C per patient/outside source 7.6 %    How often do you check your blood sugar? 3-4 times/day    Have you had a dilated eye exam in the past 12 months? Yes    Have you had a dental exam in the past 12 months? Yes    Are you checking your feet? Yes    How many days per week are you checking your feet? 4      Dietary Intake   Breakfast 2 eggs and whole wheat bread    Lunch skipped    Snack (afternoon) gold fish    Dinner chicken or tukey + seasoning + lettuce + green onions + taco + beans + cheese + salsa  + jalapeno    Snack (evening) ice cream    Beverage(s) water, black coffee, wine, latte  Exercise   Exercise Type ADL's      Individualized Goals (developed by patient)   Nutrition General guidelines for healthy choices and portions discussed;Follow meal plan discussed    Physical Activity Exercise 5-7 days per week;30 minutes per day    Monitoring  test my blood glucose as discussed;test blood glucose pre and post meals as discussed      Post-Education Assessment   Patient understands the diabetes disease and treatment process. Demonstrates understanding / competency    Patient understands incorporating nutritional management into lifestyle. Demonstrates understanding / competency    Patient undertands incorporating physical activity into lifestyle. Demonstrates understanding / competency     Patient understands using medications safely. Demonstrates understanding / competency    Patient understands monitoring blood glucose, interpreting and using results Demonstrates understanding / competency    Patient understands prevention, detection, and treatment of acute complications. Demonstrates understanding / competency    Patient understands prevention, detection, and treatment of chronic complications. Demonstrates understanding / competency    Patient understands how to develop strategies to address psychosocial issues. Demonstrates understanding / competency    Patient understands how to develop strategies to promote health/change behavior. Demonstrates understanding / competency      Outcomes   Expected Outcomes Demonstrated interest in learning. Expect positive outcomes    Future DMSE 4-6 wks    Program Status Completed           Individualized Plan for Diabetes Self-Management Training:   Learning Objective:  Patient will have a greater understanding of diabetes self-management. Patient education plan is to attend individual and/or group sessions per assessed needs and concerns.   Plan:   There are no Patient Instructions on file for this visit.  Expected Outcomes:  Demonstrated interest in learning. Expect positive outcomes  Education material provided: ADA - How to Thrive: A Guide for Your Journey with Diabetes, My Plate and Snack sheet  If problems or questions, patient to contact team via:  Phone  Future DSME appointment: 4-6 wks Test if the caffeine is causing your blood sugars to spike

## 2019-12-16 ENCOUNTER — Other Ambulatory Visit: Payer: Self-pay

## 2019-12-16 NOTE — Patient Outreach (Signed)
  [facility]) Care Management Chronic Special Needs Program    12/16/2019  Name: [name] [name], DOB: 11-12-1945  MRN: [id number]   Ms. [name] is enrolled in a chronic special needs plan for Diabetes. [place] [facility] will continue to provide services for this member through 02/05/20.  The [facility] care management team will assume care 02/06/2020.   Quinn Plowman RN,BSN,CCM [facility] Care Management [phone]

## 2020-01-04 ENCOUNTER — Ambulatory Visit: Payer: HMO | Admitting: Skilled Nursing Facility1

## 2020-01-08 ENCOUNTER — Ambulatory Visit: Payer: HMO | Admitting: Physician Assistant

## 2020-02-09 ENCOUNTER — Other Ambulatory Visit: Payer: Self-pay

## 2020-03-15 ENCOUNTER — Other Ambulatory Visit: Payer: Self-pay | Admitting: Internal Medicine

## 2020-03-15 ENCOUNTER — Telehealth: Payer: Self-pay

## 2020-03-15 ENCOUNTER — Other Ambulatory Visit: Payer: Self-pay

## 2020-03-15 ENCOUNTER — Encounter: Payer: Self-pay | Admitting: Internal Medicine

## 2020-03-15 ENCOUNTER — Other Ambulatory Visit (INDEPENDENT_AMBULATORY_CARE_PROVIDER_SITE_OTHER): Payer: HMO

## 2020-03-15 DIAGNOSIS — R300 Dysuria: Secondary | ICD-10-CM | POA: Diagnosis not present

## 2020-03-15 LAB — URINALYSIS, ROUTINE W REFLEX MICROSCOPIC
Bilirubin Urine: NEGATIVE
Ketones, ur: NEGATIVE
Nitrite: POSITIVE — AB
Specific Gravity, Urine: 1.025 (ref 1.000–1.030)
Urine Glucose: NEGATIVE
Urobilinogen, UA: 0.2 (ref 0.0–1.0)
pH: 6 (ref 5.0–8.0)

## 2020-03-15 MED ORDER — CEPHALEXIN 500 MG PO CAPS: 500.0000 mg | ORAL_CAPSULE | Freq: Three times a day (TID) | ORAL | 0 refills | Status: AC

## 2020-03-15 NOTE — Telephone Encounter (Signed)
Staff to contact pt  - needs lab appt for urine studies today please

## 2020-03-15 NOTE — Telephone Encounter (Signed)
Left pt voicemail to call office back to schedue and appt for a urine specimen today.

## 2020-03-17 LAB — URINE CULTURE

## 2020-03-19 ENCOUNTER — Encounter: Payer: Self-pay | Admitting: Internal Medicine

## 2020-03-21 ENCOUNTER — Other Ambulatory Visit: Payer: Self-pay

## 2020-03-23 ENCOUNTER — Ambulatory Visit (INDEPENDENT_AMBULATORY_CARE_PROVIDER_SITE_OTHER): Payer: HMO | Admitting: Internal Medicine

## 2020-03-23 ENCOUNTER — Encounter: Payer: Self-pay | Admitting: Internal Medicine

## 2020-03-23 ENCOUNTER — Other Ambulatory Visit: Payer: Self-pay

## 2020-03-23 VITALS — BP 132/78 | HR 83 | Temp 98.1°F | Ht 65.5 in | Wt 194.0 lb

## 2020-03-23 DIAGNOSIS — E1165 Type 2 diabetes mellitus with hyperglycemia: Secondary | ICD-10-CM

## 2020-03-23 DIAGNOSIS — E7800 Pure hypercholesterolemia, unspecified: Secondary | ICD-10-CM | POA: Diagnosis not present

## 2020-03-23 DIAGNOSIS — N952 Postmenopausal atrophic vaginitis: Secondary | ICD-10-CM | POA: Insufficient documentation

## 2020-03-23 DIAGNOSIS — I10 Essential (primary) hypertension: Secondary | ICD-10-CM | POA: Diagnosis not present

## 2020-03-23 DIAGNOSIS — N905 Atrophy of vulva: Secondary | ICD-10-CM | POA: Insufficient documentation

## 2020-03-23 MED ORDER — AMLODIPINE BESYLATE 5 MG PO TABS: 5.0000 mg | ORAL_TABLET | Freq: Every day | ORAL | 3 refills | Status: DC

## 2020-03-23 NOTE — Patient Instructions (Signed)
Please take all new medication as prescribed - the amlodipine 5 mg per day  Please continue all other medications as before, and refills have been done if requested.  Please have the pharmacy call with any other refills you may need.  Please continue your efforts at being more active, low cholesterol diet, and weight control.  Please keep your appointments with your specialists as you may have planned

## 2020-03-23 NOTE — Progress Notes (Signed)
Patient ID: [name] [name], female   DOB: October 12, 1945, 75 y.o.   MRN: [id number]        Chief Complaint: follow up HTN, HLD, DM       HPI:  [name] [name] is a 75 y.o. female here with c/o elevated BP at home; was on ACEI at one time but developed cough, so changed to losartan, states good compliance, has checked several times at home recently but BP averages 154/86; cuff seems accurate as husband checks as well with controlled BP similar to what he gets at the doctor office.  Not sure why her BP today seems better than her home average   Pt denies chest pain, increased sob or doe, wheezing, orthopnea, PND, increased LE swelling, palpitations, dizziness or syncope.   Pt denies polydipsia, polyuria, Denies new focal neuro s/s.  No other new complaints .        Wt Readings from Last 3 Encounters:  03/23/20 194 lb (88 kg)  12/07/19 188 lb 9.6 oz (85.5 kg)  11/27/19 189 lb 4 oz (85.8 kg)   BP Readings from Last 3 Encounters:  03/23/20 132/78  11/27/19 128/82  11/04/19 126/68         Past Medical History:  Diagnosis Date  . DIVERTICULOSIS, COLON 09/10/2007  . DM 03/26/2008  . Glaucoma 03/17/2011  . HYPERLIPIDEMIA 09/10/2007  . HYPERTENSION 09/10/2007  . Hypertension   . Osteoarthritis 10/21/2011  . Shingles 03/17/2011   Past Surgical History:  Procedure Laterality Date  . ABDOMINAL HYSTERECTOMY    . HEMATOMA EVACUATION     2003; abdominal GI bleed 2nd-ary to Aspirin consumption  . 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 TOTAL KNEE ARTHROPLASTY Right 03/11/2017   Procedure: RIGHT TOTAL KNEE ARTHROPLASTY;  Surgeon: Gaynelle Arabian, MD;  Location: [facility];  Service: Orthopedics;  Laterality: Right;  . TUBAL LIGATION      reports that she has never smoked. She has never used smokeless tobacco. She reports current alcohol use. She reports that she does  not use drugs. family history includes Hypertension in her father and mother. Allergies  Allergen Reactions  . Ivp Dye [Iodinated Diagnostic Agents]   . Sulfamethoxazole     REACTION: unspecified  . Sulfur    Current Outpatient Medications on File Prior to Visit  Medication Sig Dispense Refill  . acetaminophen (TYLENOL) 500 MG tablet Take 1,000 mg by mouth every 8 (eight) hours as needed for mild pain or moderate pain.    . bimatoprost (LUMIGAN) 0.01 % SOLN Place 1 drop into both eyes at bedtime.     . Continuous Blood Gluc Receiver (FREESTYLE LIBRE 14 DAY READER) DEVI 1 Device by Does not apply route daily as needed. E11.9 1 each 1  . Continuous Blood Gluc Sensor (FREESTYLE LIBRE 14 DAY SENSOR) MISC 1 Device by Does not apply route every 14 (fourteen) days. E11.9 6 each 3  . Dulaglutide (TRULICITY) 1.5 FW/2.6VZ SOPN Inject 1.5 mg into the skin once a week. 6 mL 3  . Lancet Device MISC Use as directed three times daily E11.9 300 each 3  . losartan (COZAAR) 100 MG tablet TAKE ONE TABLET BY MOUTH DAILY 90 tablet 2  . metFORMIN (GLUCOPHAGE) 500 MG tablet TAKE 2 TABLETS BY MOUTH TWO TIMES A DAY WITH A MEAL 360 tablet 2  . simvastatin (ZOCOR) 40  MG tablet TAKE ONE TABLET BY MOUTH DAILY 90 tablet 2  . ondansetron (ZOFRAN ODT) 4 MG disintegrating tablet Take 1 tablet (4 mg total) by mouth every 8 (eight) hours as needed for nausea or vomiting. (Patient not taking: Reported on 03/23/2020) 20 tablet 0   No current facility-administered medications on file prior to visit.        ROS:  All others reviewed and negative.  Objective        PE:  BP 132/78   Pulse 83   Temp 98.1 F (36.7 C) (Oral)   Ht 5' 5.5" (1.664 m)   Wt 194 lb (88 kg)   SpO2 99%   BMI 31.79 kg/m                 Constitutional: Pt appears in NAD               HENT: Head: NCAT.                Right Ear: External ear normal.                 Left Ear: External ear normal.                Eyes: . Pupils are equal, round, and  reactive to light. Conjunctivae and EOM are normal               Nose: without d/c or deformity               Neck: Neck supple. Gross normal ROM               Cardiovascular: Normal rate and regular rhythm.                 Pulmonary/Chest: Effort normal and breath sounds without rales or wheezing.                Abd:  Soft, NT, ND, + BS, no organomegaly               Neurological: Pt is alert. At baseline orientation, motor grossly intact               Skin: Skin is warm. No rashes, no other new lesions, LE edema - none               Psychiatric: Pt behavior is normal without agitation   Micro: none  Cardiac tracings I have personally interpreted today:  none  Pertinent Radiological findings (summarize): none   Lab Results  Component Value Date   WBC 18.5 (H) 10/06/2019   HGB 15.0 10/06/2019   HCT 44.2 10/06/2019   PLT 311 10/06/2019   GLUCOSE 165 (H) 10/27/2019   CHOL 127 10/27/2019   TRIG 187.0 (H) 10/27/2019   HDL 45.60 10/27/2019   LDLDIRECT 65.5 03/24/2012   LDLCALC 44 10/27/2019   ALT 20 10/27/2019   AST 24 10/27/2019   NA 136 10/27/2019   K 4.4 10/27/2019   CL 103 10/27/2019   CREATININE 0.81 10/27/2019   BUN 22 10/27/2019   CO2 24 10/27/2019   TSH 3.71 05/01/2019   INR 0.93 03/06/2017   HGBA1C 7.6 (H) 10/27/2019   MICROALBUR 1.3 05/01/2019   Assessment/Plan:  [name] [name] is a 75 y.o. White or Caucasian [1] female with  has a past medical history of DIVERTICULOSIS, COLON (09/10/2007), DM (03/26/2008), Glaucoma (03/17/2011), HYPERLIPIDEMIA (09/10/2007), HYPERTENSION (09/10/2007), Hypertension, Osteoarthritis (10/21/2011), and Shingles (03/17/2011).  Essential hypertension Despite adequate  BP here today, BP at home seems uncontrolled, will add amlodipine 5 qd, and pt to check BP again as before and report new average or any adverse s/s.    Diabetes Lab Results  Component Value Date   HGBA1C 7.6 (H) 10/27/2019   Stable, pt to continue current medical treatment -  trulicity and metformin  Current Outpatient Medications (Endocrine & Metabolic):  [name] [name]  Dulaglutide (TRULICITY) 1.5 CZ/6.6AY SOPN, Inject 1.5 mg into the skin once a week. .  metFORMIN (GLUCOPHAGE) 500 MG tablet, TAKE 2 TABLETS BY MOUTH TWO TIMES A DAY WITH A MEAL  Current Outpatient Medications (Cardiovascular):  .  amLODipine (NORVASC) 5 MG tablet, Take 1 tablet (5 mg total) by mouth daily. [name] [name]  losartan (COZAAR) 100 MG tablet, TAKE ONE TABLET BY MOUTH DAILY .  simvastatin (ZOCOR) 40 MG tablet, TAKE ONE TABLET BY MOUTH DAILY   Current Outpatient Medications (Analgesics):  .  acetaminophen (TYLENOL) 500 MG tablet, Take 1,000 mg by mouth every 8 (eight) hours as needed for mild pain or moderate pain.   Current Outpatient Medications (Other):  .  bimatoprost (LUMIGAN) 0.01 % SOLN, Place 1 drop into both eyes at bedtime.  .  Continuous Blood Gluc Receiver (FREESTYLE LIBRE 14 DAY READER) DEVI, 1 Device by Does not apply route daily as needed. E11.9 .  Continuous Blood Gluc Sensor (FREESTYLE LIBRE 14 DAY SENSOR) MISC, 1 Device by Does not apply route every 14 (fourteen) days. E11.9 .  Lancet Device MISC, Use as directed three times daily E11.9 .  ondansetron (ZOFRAN ODT) 4 MG disintegrating tablet, Take 1 tablet (4 mg total) by mouth every 8 (eight) hours as needed for nausea or vomiting. (Patient not taking: Reported on 03/23/2020)   Hyperlipidemia Lab Results  Component Value Date   LDLCALC 44 10/27/2019   Stable, pt to continue current statin  - zocor 40   Followup: Return in about 6 weeks (around 05/03/2020).  Cathlean Cower, MD 03/26/2020 11:50 PM [facility]

## 2020-03-26 ENCOUNTER — Encounter: Payer: Self-pay | Admitting: Internal Medicine

## 2020-03-26 NOTE — Assessment & Plan Note (Signed)
Lab Results  Component Value Date   HGBA1C 7.6 (H) 10/27/2019   Stable, pt to continue current medical treatment - trulicity and metformin  Current Outpatient Medications (Endocrine & Metabolic):  [name]  Dulaglutide (TRULICITY) 1.5 TW/4.4QK SOPN, Inject 1.5 mg into the skin once a week. .  metFORMIN (GLUCOPHAGE) 500 MG tablet, TAKE 2 TABLETS BY MOUTH TWO TIMES A DAY WITH A MEAL  Current Outpatient Medications (Cardiovascular):  .  amLODipine (NORVASC) 5 MG tablet, Take 1 tablet (5 mg total) by mouth daily. [name]  losartan (COZAAR) 100 MG tablet, TAKE ONE TABLET BY MOUTH DAILY .  simvastatin (ZOCOR) 40 MG tablet, TAKE ONE TABLET BY MOUTH DAILY   Current Outpatient Medications (Analgesics):  .  acetaminophen (TYLENOL) 500 MG tablet, Take 1,000 mg by mouth every 8 (eight) hours as needed for mild pain or moderate pain.   Current Outpatient Medications (Other):  .  bimatoprost (LUMIGAN) 0.01 % SOLN, Place 1 drop into both eyes at bedtime.  .  Continuous Blood Gluc Receiver (FREESTYLE LIBRE 14 DAY READER) DEVI, 1 Device by Does not apply route daily as needed. E11.9 .  Continuous Blood Gluc Sensor (FREESTYLE LIBRE 14 DAY SENSOR) MISC, 1 Device by Does not apply route every 14 (fourteen) days. E11.9 .  Lancet Device MISC, Use as directed three times daily E11.9 .  ondansetron (ZOFRAN ODT) 4 MG disintegrating tablet, Take 1 tablet (4 mg total) by mouth every 8 (eight) hours as needed for nausea or vomiting. (Patient not taking: Reported on 03/23/2020)

## 2020-03-26 NOTE — Assessment & Plan Note (Signed)
Despite adequate BP here today, BP at home seems uncontrolled, will add amlodipine 5 qd, and pt to check BP again as before and report new average or any adverse s/s.

## 2020-03-26 NOTE — Assessment & Plan Note (Signed)
Lab Results  Component Value Date   LDLCALC 44 10/27/2019   Stable, pt to continue current statin  - zocor 40

## 2020-04-06 ENCOUNTER — Other Ambulatory Visit: Payer: Self-pay | Admitting: Internal Medicine

## 2020-04-19 DIAGNOSIS — H40053 Ocular hypertension, bilateral: Secondary | ICD-10-CM | POA: Diagnosis not present

## 2020-05-02 ENCOUNTER — Other Ambulatory Visit (INDEPENDENT_AMBULATORY_CARE_PROVIDER_SITE_OTHER): Payer: HMO

## 2020-05-02 DIAGNOSIS — E559 Vitamin D deficiency, unspecified: Secondary | ICD-10-CM

## 2020-05-02 DIAGNOSIS — E538 Deficiency of other specified B group vitamins: Secondary | ICD-10-CM

## 2020-05-02 DIAGNOSIS — E1165 Type 2 diabetes mellitus with hyperglycemia: Secondary | ICD-10-CM

## 2020-05-02 DIAGNOSIS — E119 Type 2 diabetes mellitus without complications: Secondary | ICD-10-CM | POA: Diagnosis not present

## 2020-05-02 LAB — MICROALBUMIN / CREATININE URINE RATIO
Creatinine,U: 77.2 mg/dL
Microalb Creat Ratio: 1.8 mg/g (ref 0.0–30.0)
Microalb, Ur: 1.4 mg/dL (ref 0.0–1.9)

## 2020-05-02 LAB — VITAMIN B12: Vitamin B-12: 822 pg/mL (ref 211–911)

## 2020-05-02 LAB — COMPREHENSIVE METABOLIC PANEL
ALT: 18 U/L (ref 0–35)
AST: 20 U/L (ref 0–37)
Albumin: 4.4 g/dL (ref 3.5–5.2)
Alkaline Phosphatase: 64 U/L (ref 39–117)
BUN: 21 mg/dL (ref 6–23)
CO2: 21 mEq/L (ref 19–32)
Calcium: 9.3 mg/dL (ref 8.4–10.5)
Chloride: 103 mEq/L (ref 96–112)
Creatinine, Ser: 0.8 mg/dL (ref 0.40–1.20)
GFR: 71.57 mL/min (ref >60.00)
Glucose, Bld: 202 mg/dL — ABNORMAL HIGH (ref 70–99)
Potassium: 4.5 mEq/L (ref 3.5–5.1)
Sodium: 135 mEq/L (ref 135–145)
Total Bilirubin: 1.1 mg/dL (ref 0.2–1.2)
Total Protein: 7 g/dL (ref 6.0–8.3)

## 2020-05-02 LAB — URINALYSIS, ROUTINE W REFLEX MICROSCOPIC
Bilirubin Urine: NEGATIVE
Hgb urine dipstick: NEGATIVE
Ketones, ur: NEGATIVE
Leukocytes,Ua: NEGATIVE
Nitrite: NEGATIVE
RBC / HPF: NONE SEEN (ref 0–?)
Specific Gravity, Urine: 1.015 (ref 1.000–1.030)
Total Protein, Urine: NEGATIVE
Urine Glucose: NEGATIVE
Urobilinogen, UA: 0.2 (ref 0.0–1.0)
pH: 6 (ref 5.0–8.0)

## 2020-05-02 LAB — LIPID PANEL
Cholesterol: 143 mg/dL (ref 0–200)
HDL: 46.4 mg/dL (ref >39.00)
LDL Cholesterol: 67 mg/dL (ref 0–99)
NonHDL: 96.68
Total CHOL/HDL Ratio: 3
Triglycerides: 146 mg/dL (ref 0.0–149.0)
VLDL: 29.2 mg/dL (ref 0.0–40.0)

## 2020-05-02 LAB — TSH: TSH: 3.26 u[IU]/mL (ref 0.35–4.50)

## 2020-05-02 LAB — CBC WITH DIFFERENTIAL/PLATELET
Basophils Absolute: 0.1 10*3/uL (ref 0.0–0.1)
Basophils Relative: 1.1 % (ref 0.0–3.0)
Eosinophils Absolute: 0.2 10*3/uL (ref 0.0–0.7)
Eosinophils Relative: 2 % (ref 0.0–5.0)
HCT: 44.9 % (ref 36.0–46.0)
Hemoglobin: 15.5 g/dL — ABNORMAL HIGH (ref 12.0–15.0)
Lymphocytes Relative: 22.4 % (ref 12.0–46.0)
Lymphs Abs: 2 10*3/uL (ref 0.7–4.0)
MCHC: 34.5 g/dL (ref 30.0–36.0)
MCV: 90.5 fl (ref 78.0–100.0)
Monocytes Absolute: 0.7 10*3/uL (ref 0.1–1.0)
Monocytes Relative: 7.6 % (ref 3.0–12.0)
Neutro Abs: 6 10*3/uL (ref 1.4–7.7)
Neutrophils Relative %: 66.9 % (ref 43.0–77.0)
Platelets: 266 10*3/uL (ref 150.0–400.0)
RBC: 4.96 Mil/uL (ref 3.87–5.11)
RDW: 13.1 % (ref 11.5–15.5)
WBC: 8.9 10*3/uL (ref 4.0–10.5)

## 2020-05-02 LAB — VITAMIN D 25 HYDROXY (VIT D DEFICIENCY, FRACTURES): VITD: 34.06 ng/mL (ref 30.00–100.00)

## 2020-05-02 LAB — HEMOGLOBIN A1C: Hgb A1c MFr Bld: 7.3 % — ABNORMAL HIGH (ref 4.6–6.5)

## 2020-05-02 NOTE — Addendum Note (Signed)
Addended by: Jacobo Forest on: 05/02/2020 09:55 AM   Modules accepted: Orders

## 2020-05-03 ENCOUNTER — Encounter: Payer: Self-pay | Admitting: Internal Medicine

## 2020-05-03 ENCOUNTER — Ambulatory Visit (INDEPENDENT_AMBULATORY_CARE_PROVIDER_SITE_OTHER): Payer: HMO | Admitting: Internal Medicine

## 2020-05-03 ENCOUNTER — Other Ambulatory Visit: Payer: Self-pay

## 2020-05-03 VITALS — BP 138/80 | HR 76 | Temp 98.3°F | Ht 65.5 in | Wt 193.0 lb

## 2020-05-03 DIAGNOSIS — Z0000 Encounter for general adult medical examination without abnormal findings: Secondary | ICD-10-CM

## 2020-05-03 DIAGNOSIS — E1165 Type 2 diabetes mellitus with hyperglycemia: Secondary | ICD-10-CM | POA: Diagnosis not present

## 2020-05-03 DIAGNOSIS — Z90710 Acquired absence of both cervix and uterus: Secondary | ICD-10-CM | POA: Insufficient documentation

## 2020-05-03 DIAGNOSIS — N941 Unspecified dyspareunia: Secondary | ICD-10-CM | POA: Insufficient documentation

## 2020-05-03 DIAGNOSIS — E559 Vitamin D deficiency, unspecified: Secondary | ICD-10-CM

## 2020-05-03 DIAGNOSIS — E669 Obesity, unspecified: Secondary | ICD-10-CM | POA: Insufficient documentation

## 2020-05-03 DIAGNOSIS — L900 Lichen sclerosus et atrophicus: Secondary | ICD-10-CM | POA: Insufficient documentation

## 2020-05-03 MED ORDER — TRULICITY 3 MG/0.5ML ~~LOC~~ SOAJ: 3.0000 mg | SUBCUTANEOUS | 3 refills | Status: DC

## 2020-05-03 NOTE — Patient Instructions (Signed)
Ok to increase the trulicity to the 3 mg dosing weekly  Please take OTC Vitamin D3 at 2000 units per day, indefinitely.  Please continue all other medications as before, and refills have been done if requested.  Please have the pharmacy call with any other refills you may need.  Please continue your efforts at being more active, low cholesterol diet, and weight control.  You are otherwise up to date with prevention measures today.  Please keep your appointments with your specialists as you may have planned  Please make an Appointment to return in 6 months, or sooner if needed, also with Lab Appointment for testing done 3-5 days before at the [facility] (so this is for TWO appointments - please see the scheduling desk as you leave)  Due to the ongoing Covid 19 pandemic, our lab now requires an appointment for any labs done at our office.  If you need labs done and do not have an appointment, please call our office ahead of time to schedule before presenting to the lab for your testing.

## 2020-05-03 NOTE — Progress Notes (Signed)
Patient ID: [name] [name], female   DOB: [birth year], 75 y.o.   MRN: [id number]         Chief Complaint:: wellness exam and HTN, DM, low vit d       HPI:  [name] [name] is a 75 y.o. female here for wellness exam; up to date with preventive referrals and immunizations  Also, Pt denies chest pain, increased sob or doe, wheezing, orthopnea, PND, increased LE swelling, palpitations, dizziness or syncope.  Denies neuro focal s/s.   Pt denies polydipsia, polyuria   Pt denies fever, wt loss, night sweats, loss of appetite, or other constitutional symptoms                    Wt Readings from Last 3 Encounters:  05/03/20 193 lb (87.5 kg)  03/23/20 194 lb (88 kg)  12/07/19 188 lb 9.6 oz (85.5 kg)   BP Readings from Last 3 Encounters:  05/03/20 138/80  03/23/20 132/78  11/27/19 128/82   Immunization History  Administered Date(s) Administered  . Fluad Quad(high Dose 65+) 10/29/2018, 11/04/2019  . Influenza Split 10/16/2011  . Influenza Whole 11/02/2008, 12/06/2009  . Influenza, High Dose Seasonal PF 11/08/2016, 11/27/2017  . Influenza,inj,Quad PF,6+ Mos 10/29/2013  . Influenza-Unspecified 11/06/2015  . PFIZER(Purple Top)SARS-COV-2 Vaccination 03/09/2019, 03/30/2019, 11/06/2019  . Pneumococcal Conjugate-13 06/25/2013  . Pneumococcal Polysaccharide-23 11/02/2008, 10/29/2018  . Td 02/05/2005   There are no preventive care reminders to display for this patient.    Past Medical History:  Diagnosis Date  . DIVERTICULOSIS, COLON 09/10/2007  . DM 03/26/2008  . Glaucoma 03/17/2011  . HYPERLIPIDEMIA 09/10/2007  . HYPERTENSION 09/10/2007  . Hypertension   . Osteoarthritis 10/21/2011  . Shingles 03/17/2011   Past Surgical History:  Procedure Laterality Date  . ABDOMINAL HYSTERECTOMY    . HEMATOMA EVACUATION     2003; abdominal GI bleed 2nd-ary to Aspirin consumption  . 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 TOTAL KNEE ARTHROPLASTY Right 03/11/2017   Procedure: RIGHT TOTAL KNEE ARTHROPLASTY;  Surgeon: Gaynelle Arabian, MD;  Location: [facility];  Service: Orthopedics;  Laterality: Right;  . TUBAL LIGATION      reports that she has never smoked. She has never used smokeless tobacco. She reports current alcohol use. She reports that she does not use drugs. family history includes Hypertension in her father and mother. Allergies  Allergen Reactions  . Ivp Dye [Iodinated Diagnostic Agents]   . Sulfamethoxazole     REACTION: unspecified  . Sulfur    Current Outpatient Medications on File Prior to Visit  Medication Sig Dispense Refill  . acetaminophen (TYLENOL) 500 MG tablet Take 1,000 mg by mouth every 8 (eight) hours as needed for mild pain or moderate pain.    [name] amLODipine (NORVASC) 5 MG tablet Take 1 tablet (5 mg total) by mouth daily. 90 tablet 3  . bimatoprost (LUMIGAN) 0.01 % SOLN Place 1 drop into both eyes at bedtime.     . Continuous Blood Gluc Receiver (FREESTYLE LIBRE 14 DAY READER) DEVI 1 Device by Does not apply route daily as needed. E11.9 1 each 1  . Continuous Blood Gluc Sensor (FREESTYLE LIBRE 14 DAY SENSOR) MISC CHANGE EVERY 14 DAYS 100 each 3  . Lancet Device MISC Use as directed three times daily E11.9 300 each 3  . losartan (COZAAR) 100  MG tablet TAKE ONE TABLET BY MOUTH DAILY 90 tablet 2  . metFORMIN (GLUCOPHAGE) 500 MG tablet TAKE 2 TABLETS BY MOUTH TWO TIMES A DAY WITH A MEAL 360 tablet 2  . simvastatin (ZOCOR) 40 MG tablet TAKE ONE TABLET BY MOUTH DAILY 90 tablet 2  . ondansetron (ZOFRAN ODT) 4 MG disintegrating tablet Take 1 tablet (4 mg total) by mouth every 8 (eight) hours as needed for nausea or vomiting. (Patient not taking: Reported on 05/03/2020) 20 tablet 0   No current facility-administered medications on file prior to visit.        ROS:  All others reviewed and negative.  Objective        PE:  BP 138/80  (BP Location: Left Arm, Patient Position: Sitting, Cuff Size: Normal)   Pulse 76   Temp 98.3 F (36.8 C) (Oral)   Ht 5' 5.5" (1.664 m)   Wt 193 lb (87.5 kg)   SpO2 99%   BMI 31.63 kg/m                 Constitutional: Pt appears in NAD               HENT: Head: NCAT.                Right Ear: External ear normal.                 Left Ear: External ear normal.                Eyes: . Pupils are equal, round, and reactive to light. Conjunctivae and EOM are normal               Nose: without d/c or deformity               Neck: Neck supple. Gross normal ROM               Cardiovascular: Normal rate and regular rhythm.                 Pulmonary/Chest: Effort normal and breath sounds without rales or wheezing.                Abd:  Soft, NT, ND, + BS, no organomegaly               Neurological: Pt is alert. At baseline orientation, motor grossly intact               Skin: Skin is warm. No rashes, no other new lesions, LE edema - none               Psychiatric: Pt behavior is normal without agitation   Micro: none  Cardiac tracings I have personally interpreted today:  none  Pertinent Radiological findings (summarize): none   Lab Results  Component Value Date   WBC 8.9 05/02/2020   HGB 15.5 (H) 05/02/2020   HCT 44.9 05/02/2020   PLT 266.0 05/02/2020   GLUCOSE 202 (H) 05/02/2020   CHOL 143 05/02/2020   TRIG 146.0 05/02/2020   HDL 46.40 05/02/2020   LDLDIRECT 65.5 03/24/2012   LDLCALC 67 05/02/2020   ALT 18 05/02/2020   AST 20 05/02/2020   NA 135 05/02/2020   K 4.5 05/02/2020   CL 103 05/02/2020   CREATININE 0.80 05/02/2020   BUN 21 05/02/2020   CO2 21 05/02/2020   TSH 3.26 05/02/2020   INR 0.93 03/06/2017   HGBA1C 7.3 (H) 05/02/2020   MICROALBUR 1.4  05/02/2020   Assessment/Plan:  [name] [name] is a 75 y.o. White or Caucasian [1] female with  has a past medical history of DIVERTICULOSIS, COLON (09/10/2007), DM (03/26/2008), Glaucoma (03/17/2011), HYPERLIPIDEMIA (09/10/2007),  HYPERTENSION (09/10/2007), Hypertension, Osteoarthritis (10/21/2011), and Shingles (03/17/2011).  Wellness examination Age and sex appropriate education and counseling updated with regular exercise and diet Referrals for preventative services - none needed Immunizations addressed - none needed Smoking counseling  - none needed Evidence for depression or other mood disorder - none significant Most recent labs reviewed. I have personally reviewed and have noted: 1) the patient's medical and social history 2) The patient's current medications and supplements 3) The patient's height, weight, and BMI have been recorded in the chart   Diabetes mellitus ([place]) Uncontrolled, to increaes the trulicity to 3 mg  Vitamin D deficiency Last vitamin D Lab Results  Component Value Date   VD25OH 34.06 05/02/2020   Low normal , to start oral replacement  Followup: Return in about 6 months (around 11/03/2020).  Cathlean Cower, MD 05/10/2020 9:23 PM [facility]

## 2020-05-10 ENCOUNTER — Encounter: Payer: Self-pay | Admitting: Internal Medicine

## 2020-05-10 DIAGNOSIS — E559 Vitamin D deficiency, unspecified: Secondary | ICD-10-CM | POA: Insufficient documentation

## 2020-05-10 NOTE — Assessment & Plan Note (Signed)

## 2020-05-10 NOTE — Assessment & Plan Note (Signed)
Last vitamin D Lab Results  Component Value Date   VD25OH 34.06 05/02/2020   Low normal , to start oral replacement

## 2020-05-10 NOTE — Assessment & Plan Note (Signed)
Uncontrolled, to increaes the trulicity to 3 mg

## 2020-06-10 DIAGNOSIS — L905 Scar conditions and fibrosis of skin: Secondary | ICD-10-CM | POA: Diagnosis not present

## 2020-06-10 DIAGNOSIS — L820 Inflamed seborrheic keratosis: Secondary | ICD-10-CM | POA: Diagnosis not present

## 2020-06-10 DIAGNOSIS — L738 Other specified follicular disorders: Secondary | ICD-10-CM | POA: Diagnosis not present

## 2020-06-10 DIAGNOSIS — Z85828 Personal history of other malignant neoplasm of skin: Secondary | ICD-10-CM | POA: Diagnosis not present

## 2020-07-04 ENCOUNTER — Encounter: Payer: Self-pay | Admitting: Internal Medicine

## 2020-07-05 DIAGNOSIS — Z1152 Encounter for screening for COVID-19: Secondary | ICD-10-CM | POA: Diagnosis not present

## 2020-07-05 DIAGNOSIS — U071 COVID-19: Secondary | ICD-10-CM | POA: Diagnosis not present

## 2020-07-06 MED ORDER — NIRMATRELVIR/RITONAVIR (PAXLOVID)TABLET: 3.0000 | ORAL_TABLET | Freq: Two times a day (BID) | ORAL | 0 refills | Status: AC

## 2020-07-06 MED ORDER — NIRMATRELVIR/RITONAVIR (PAXLOVID)TABLET: 3.0000 | ORAL_TABLET | Freq: Two times a day (BID) | ORAL | 0 refills | Status: DC

## 2020-07-11 DIAGNOSIS — Z1152 Encounter for screening for COVID-19: Secondary | ICD-10-CM | POA: Diagnosis not present

## 2020-08-05 DIAGNOSIS — H40053 Ocular hypertension, bilateral: Secondary | ICD-10-CM | POA: Diagnosis not present

## 2020-08-22 ENCOUNTER — Ambulatory Visit: Payer: PPO

## 2020-08-23 ENCOUNTER — Other Ambulatory Visit: Payer: Self-pay | Admitting: Internal Medicine

## 2020-08-23 NOTE — Telephone Encounter (Signed)
Please refill as per office routine med refill policy (all routine meds refilled for 3 mo or monthly per pt preference up to one year from last visit, then month to month grace period for 3 mo, then further med refills will have to be denied)  

## 2020-08-26 ENCOUNTER — Encounter: Payer: Self-pay | Admitting: Gastroenterology

## 2020-09-19 ENCOUNTER — Encounter: Payer: Self-pay | Admitting: Internal Medicine

## 2020-09-26 ENCOUNTER — Encounter: Payer: Self-pay | Admitting: Internal Medicine

## 2020-09-26 DIAGNOSIS — R82998 Other abnormal findings in urine: Secondary | ICD-10-CM

## 2020-09-26 NOTE — Telephone Encounter (Signed)
Pt called to see if she can have an order put in for a urine sample. Offered OV but she declined.   Please advise.

## 2020-09-27 ENCOUNTER — Other Ambulatory Visit (INDEPENDENT_AMBULATORY_CARE_PROVIDER_SITE_OTHER): Payer: HMO

## 2020-09-27 ENCOUNTER — Encounter: Payer: Self-pay | Admitting: Internal Medicine

## 2020-09-27 ENCOUNTER — Other Ambulatory Visit: Payer: Self-pay | Admitting: Internal Medicine

## 2020-09-27 DIAGNOSIS — E119 Type 2 diabetes mellitus without complications: Secondary | ICD-10-CM

## 2020-09-27 DIAGNOSIS — R82998 Other abnormal findings in urine: Secondary | ICD-10-CM

## 2020-09-27 LAB — CBC WITH DIFFERENTIAL/PLATELET
Basophils Absolute: 0.1 10*3/uL (ref 0.0–0.1)
Basophils Relative: 1 % (ref 0.0–3.0)
Eosinophils Absolute: 0.2 10*3/uL (ref 0.0–0.7)
Eosinophils Relative: 2.2 % (ref 0.0–5.0)
HCT: 44.4 % (ref 36.0–46.0)
Hemoglobin: 15.2 g/dL — ABNORMAL HIGH (ref 12.0–15.0)
Lymphocytes Relative: 27.1 % (ref 12.0–46.0)
Lymphs Abs: 2.4 10*3/uL (ref 0.7–4.0)
MCHC: 34.2 g/dL (ref 30.0–36.0)
MCV: 91.2 fl (ref 78.0–100.0)
Monocytes Absolute: 0.9 10*3/uL (ref 0.1–1.0)
Monocytes Relative: 9.6 % (ref 3.0–12.0)
Neutro Abs: 5.3 10*3/uL (ref 1.4–7.7)
Neutrophils Relative %: 60.1 % (ref 43.0–77.0)
Platelets: 286 10*3/uL (ref 150.0–400.0)
RBC: 4.87 Mil/uL (ref 3.87–5.11)
RDW: 13.3 % (ref 11.5–15.5)
WBC: 8.9 10*3/uL (ref 4.0–10.5)

## 2020-09-27 LAB — URINALYSIS, ROUTINE W REFLEX MICROSCOPIC
Bilirubin Urine: NEGATIVE
Hgb urine dipstick: NEGATIVE
Ketones, ur: NEGATIVE
Nitrite: NEGATIVE
RBC / HPF: NONE SEEN (ref 0–?)
Specific Gravity, Urine: 1.01 (ref 1.000–1.030)
Total Protein, Urine: NEGATIVE
Urine Glucose: NEGATIVE
Urobilinogen, UA: 0.2 (ref 0.0–1.0)
pH: 6 (ref 5.0–8.0)

## 2020-09-27 LAB — HEPATIC FUNCTION PANEL
ALT: 18 U/L (ref 0–35)
AST: 20 U/L (ref 0–37)
Albumin: 4.3 g/dL (ref 3.5–5.2)
Alkaline Phosphatase: 65 U/L (ref 39–117)
Bilirubin, Direct: 0.2 mg/dL (ref 0.0–0.3)
Total Bilirubin: 1.4 mg/dL — ABNORMAL HIGH (ref 0.2–1.2)
Total Protein: 7.3 g/dL (ref 6.0–8.3)

## 2020-09-27 LAB — BASIC METABOLIC PANEL
BUN: 19 mg/dL (ref 6–23)
CO2: 23 mEq/L (ref 19–32)
Calcium: 9.7 mg/dL (ref 8.4–10.5)
Chloride: 101 mEq/L (ref 96–112)
Creatinine, Ser: 0.84 mg/dL (ref 0.40–1.20)
GFR: 67.31 mL/min (ref >60.00)
Glucose, Bld: 172 mg/dL — ABNORMAL HIGH (ref 70–99)
Potassium: 4.3 mEq/L (ref 3.5–5.1)
Sodium: 136 mEq/L (ref 135–145)

## 2020-09-27 MED ORDER — CEPHALEXIN 500 MG PO CAPS: 500.0000 mg | ORAL_CAPSULE | Freq: Three times a day (TID) | ORAL | 0 refills | Status: AC

## 2020-09-27 NOTE — Addendum Note (Signed)
Addended by: Colin Benton on: 09/27/2020 09:43 AM   Modules accepted: Orders

## 2020-09-28 ENCOUNTER — Encounter: Payer: Self-pay | Admitting: Internal Medicine

## 2020-09-28 LAB — URINE CULTURE
MICRO NUMBER:: 12279267
SPECIMEN QUALITY:: ADEQUATE

## 2020-10-05 ENCOUNTER — Telehealth: Payer: Self-pay

## 2020-10-05 NOTE — Telephone Encounter (Signed)
I have reviewed her chart. Recommend colonoscopy in 10/2020 as currently planned.

## 2020-10-05 NOTE — Telephone Encounter (Signed)
Dr.Stark, Please review chart and advise on when patient is due for recall colon- during chart prep for PV appt, it was found that the recall sheet completed in 05/2020 states colon should be done on 09/2022, however, patient has been scheduled for a PV on 10/17/2020 and colon on 10/31/2020. Please clarify  Thank you

## 2020-10-05 NOTE — Telephone Encounter (Signed)
Noted on PV chart ?

## 2020-10-17 ENCOUNTER — Other Ambulatory Visit: Payer: Self-pay

## 2020-10-17 ENCOUNTER — Encounter: Payer: Self-pay | Admitting: Gastroenterology

## 2020-10-17 ENCOUNTER — Ambulatory Visit (AMBULATORY_SURGERY_CENTER): Payer: HMO | Admitting: *Deleted

## 2020-10-17 VITALS — Ht 65.5 in | Wt 190.0 lb

## 2020-10-17 DIAGNOSIS — Z86010 Personal history of colonic polyps: Secondary | ICD-10-CM

## 2020-10-17 MED ORDER — PEG 3350-KCL-NA BICARB-NACL 420 G PO SOLR: 4000.0000 mL | Freq: Once | ORAL | 0 refills | Status: AC

## 2020-10-17 NOTE — Progress Notes (Signed)
Pt verified name, DOB, address and insurance during PV today.  Pt mailed instruction  copy of consent form to read and not return, and instructions.  PV completed over the phone.  Pt encouraged to call with questions or issues.  My Chart instructions to pt as well    No egg or soy allergy known to patient  No issues known to pt with past sedation with any surgeries or procedures Patient denies ever being told they had issues or difficulty with intubation  No FH of Malignant Hyperthermia Pt is not on diet pills Pt is not on  home 02  Pt is not on blood thinners  Pt denies issues with constipation  No A fib or A flutter  Pt is fully vaccinated  for Covid   Due to the COVID-19 pandemic we are asking patients to follow certain guidelines.  Pt aware of COVID protocols and [facility] guidelines   Pt given the option in PV today for Golytely prep verses  alternative prep  ( Sutab/Suprep/Plenvu)-  Pt is aware the Golytely has more volume but is more cost effective and the Sutab/ Suprep/Plenvu is less volume but may cost $40-150.   Pt voiced understanding of this and choose Golytely Prep.

## 2020-10-31 ENCOUNTER — Encounter: Payer: Self-pay | Admitting: Gastroenterology

## 2020-10-31 ENCOUNTER — Ambulatory Visit (AMBULATORY_SURGERY_CENTER): Payer: HMO | Admitting: Gastroenterology

## 2020-10-31 ENCOUNTER — Other Ambulatory Visit: Payer: Self-pay

## 2020-10-31 VITALS — BP 116/68 | HR 69 | Temp 97.8°F | Resp 14 | Ht 65.5 in | Wt 190.0 lb

## 2020-10-31 DIAGNOSIS — Z86010 Personal history of colonic polyps: Secondary | ICD-10-CM

## 2020-10-31 DIAGNOSIS — I10 Essential (primary) hypertension: Secondary | ICD-10-CM | POA: Diagnosis not present

## 2020-10-31 DIAGNOSIS — E119 Type 2 diabetes mellitus without complications: Secondary | ICD-10-CM | POA: Diagnosis not present

## 2020-10-31 MED ORDER — SODIUM CHLORIDE 0.9 % IV SOLN: 500.0000 mL | Freq: Once | INTRAVENOUS | Status: DC

## 2020-10-31 MED ORDER — DICYCLOMINE HCL 10 MG PO CAPS: 10.0000 mg | ORAL_CAPSULE | Freq: Three times a day (TID) | ORAL | 3 refills | Status: DC | PRN

## 2020-10-31 NOTE — Progress Notes (Signed)
Report given to PACU, vss 

## 2020-10-31 NOTE — Progress Notes (Signed)
Pt's states no medical or surgical changes since previsit or office visit.   CHECK-IN-AER  V/S-CW

## 2020-10-31 NOTE — Op Note (Signed)
[facility] Patient Name: [name] [name] Procedure Date: 10/31/2020 11:29 AM MRN: [id number] Endoscopist: Ladene Artist , MD Age: 75 Referring MD:  Date of Birth: 1945-04-12 Gender: Female Account #: [id number] Procedure:                Colonoscopy Indications:              Surveillance: Personal history of adenomatous                            polyps on last colonoscopy 5 years ago Medicines:                Monitored Anesthesia Care Procedure:                Pre-Anesthesia Assessment:                           - Prior to the procedure, a History and Physical                            was performed, and patient medications and                            allergies were reviewed. The patient's tolerance of                            previous anesthesia was also reviewed. The risks                            and benefits of the procedure and the sedation                            options and risks were discussed with the patient.                            All questions were answered, and informed consent                            was obtained. Prior Anticoagulants: The patient has                            taken no previous anticoagulant or antiplatelet                            agents. ASA Grade Assessment: II - A patient with                            mild systemic disease. After reviewing the risks                            and benefits, the patient was deemed in                            satisfactory condition to undergo the procedure.  After obtaining informed consent, the colonoscope                            was passed under direct vision. Throughout the                            procedure, the patient's blood pressure, pulse, and                            oxygen saturations were monitored continuously. The                            Olympus CF-HQ190L [id number]) Colonoscope was                            introduced through the anus  and advanced to the the                            cecum, identified by appendiceal orifice and                            ileocecal valve. The ileocecal valve, appendiceal                            orifice, and rectum were photographed. The quality                            of the bowel preparation was good. The colonoscopy                            was performed without difficulty. The patient                            tolerated the procedure well. Scope In: 11:40:11 AM Scope Out: 11:53:39 AM Scope Withdrawal Time: 0 hours 10 minutes 37 seconds  Total Procedure Duration: 0 hours 13 minutes 28 seconds  Findings:                 The perianal and digital rectal examinations were                            normal.                           Multiple medium-mouthed diverticula were found in                            the sigmoid colon, descending colon and transverse                            colon. There was evidence of diverticular spasm.                            There was no evidence of diverticular bleeding.  Internal hemorrhoids were found during                            retroflexion. The hemorrhoids were small and Grade                            I (internal hemorrhoids that do not prolapse).                           The exam was otherwise without abnormality on                            direct and retroflexion views. Complications:            No immediate complications. Estimated blood loss:                            None. Estimated Blood Loss:     Estimated blood loss: none. Impression:               - Moderate diverticulosis in the sigmoid colon, in                            the descending colon and in the transverse colon.                           - Small internal hemorrhoids.                           - The examination was otherwise normal on direct                            and retroflexion views.                           - No specimens  collected. Recommendation:           - Patient has a contact number available for                            emergencies. The signs and symptoms of potential                            delayed complications were discussed with the                            patient. Return to normal activities tomorrow.                            Written discharge instructions were provided to the                            patient.                           - High fiber diet.                           -  Continue present medications.                           - Dicyclomine 10 mg po tid ac prn, #90, 3 refills.                           - No repeat colonoscopy due to age and the absence                            of colonic polyps. Ladene Artist, MD 10/31/2020 11:59:41 AM This report has been signed electronically.

## 2020-10-31 NOTE — Patient Instructions (Signed)
YOU HAD AN ENDOSCOPIC PROCEDURE TODAY AT THE [facility]:   Refer to the procedure report that was given to you for any specific questions about what was found during the examination.  If the procedure report does not answer your questions, please call your gastroenterologist to clarify.  If you requested that your care partner not be given the details of your procedure findings, then the procedure report has been included in a sealed envelope for you to review at your convenience later. ° °YOU SHOULD EXPECT: Some feelings of bloating in the abdomen. Passage of more gas than usual.  Walking can help get rid of the air that was put into your GI tract during the procedure and reduce the bloating. If you had a lower endoscopy (such as a colonoscopy or flexible sigmoidoscopy) you may notice spotting of blood in your stool or on the toilet paper. If you underwent a bowel prep for your procedure, you may not have a normal bowel movement for a few days. ° °Please Note:  You might notice some irritation and congestion in your nose or some drainage.  This is from the oxygen used during your procedure.  There is no need for concern and it should clear up in a day or so. ° °SYMPTOMS TO REPORT IMMEDIATELY: ° °Following lower endoscopy (colonoscopy or flexible sigmoidoscopy): ° Excessive amounts of blood in the stool ° Significant tenderness or worsening of abdominal pains ° Swelling of the abdomen that is new, acute ° Fever of 100°F or higher ° °For urgent or emergent issues, a gastroenterologist can be reached at any hour by calling [phone]. °Do not use MyChart messaging for urgent concerns.  ° ° °DIET:  We do recommend a small meal at first, but then you may proceed to your regular diet.  Drink plenty of fluids but you should avoid alcoholic beverages for 24 hours. ° °ACTIVITY:  You should plan to take it easy for the rest of today and you should NOT DRIVE or use heavy machinery until tomorrow (because of  the sedation medicines used during the test).   ° °FOLLOW UP: °Our staff will call the number listed on your records 48-72 hours following your procedure to check on you and address any questions or concerns that you may have regarding the information given to you following your procedure. If we do not reach you, we will leave a message.  We will attempt to reach you two times.  During this call, we will ask if you have developed any symptoms of COVID 19. If you develop any symptoms (ie: fever, flu-like symptoms, shortness of breath, cough etc.) before then, please call [phone].  If you test positive for Covid 19 in the 2 weeks post procedure, please call and report this information to us.   ° °SIGNATURES/CONFIDENTIALITY: °You and/or your care partner have signed paperwork which will be entered into your electronic medical record.  These signatures attest to the fact that that the information above on your After Visit Summary has been reviewed and is understood.  Full responsibility of the confidentiality of this discharge information lies with you and/or your care-partner.  °

## 2020-10-31 NOTE — Progress Notes (Signed)
History & Physical  Primary Care Physician:  Biagio Borg, MD Primary Gastroenterologist: Jerilynn Mages. Fuller Plan, MD  CHIEF COMPLAINT:  Personal history of colon polyps   HPI: [name] [name] is a 75 y.o. female with a history of adenomatous colon polyps and diverticulosis who presents for surveillance colonoscopy.  She had diverticulitis a few months ago.  She notes urgent loose stools following meals that happens about twice per week.     Past Medical History:  Diagnosis Date   Cataract    removed both eyes   Diverticulitis    10-06-2019, 11-27-2019   DIVERTICULOSIS, COLON 09/10/2007   DM 03/26/2008   GERD (gastroesophageal reflux disease)    Glaucoma 03/17/2011   HYPERLIPIDEMIA 09/10/2007   HYPERTENSION 09/10/2007   Hypertension    Osteoarthritis 10/21/2011   Shingles 03/17/2011    Past Surgical History:  Procedure Laterality Date   ABDOMINAL HYSTERECTOMY     COLONOSCOPY     HEMATOMA EVACUATION     2003; abdominal GI bleed 2nd-ary to Aspirin consumption   POLYPECTOMY     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   Procedure: LEFT TOTAL KNEE ARTHROPLASTY;  Surgeon: Gaynelle Arabian, MD;  Location: [facility];  Service: Orthopedics;  Laterality: Left;  with canal block   TOTAL KNEE ARTHROPLASTY Right 03/11/2017   Procedure: RIGHT TOTAL KNEE ARTHROPLASTY;  Surgeon: Gaynelle Arabian, MD;  Location: [facility];  Service: Orthopedics;  Laterality: Right;   TUBAL LIGATION      Prior to Admission medications   Medication Sig Start Date End Date Taking? Authorizing Provider  acetaminophen (TYLENOL) 500 MG tablet Take 1,000 mg by mouth every 8 (eight) hours as needed for mild pain or moderate pain.   Yes [provider]  amLODipine (NORVASC) 5 MG tablet Take 1 tablet (5 mg total) by mouth daily. 03/23/20 03/23/21 Yes Biagio Borg, MD  bimatoprost (LUMIGAN) 0.01 % SOLN Place 1 drop into both eyes at bedtime.    Yes [provider]  Continuous Blood Gluc Receiver (FREESTYLE LIBRE 14 DAY  READER) DEVI 1 Device by Does not apply route daily as needed. E11.9 05/26/19  Yes Biagio Borg, MD  Continuous Blood Gluc Sensor (FREESTYLE LIBRE 14 DAY SENSOR) MISC CHANGE EVERY 14 DAYS 04/06/20  Yes Biagio Borg, MD  losartan (COZAAR) 100 MG tablet TAKE ONE TABLET BY MOUTH DAILY 08/23/20  Yes Biagio Borg, MD  metFORMIN (GLUCOPHAGE) 500 MG tablet TAKE 2 TABLETS BY MOUTH TWO TIMES A DAY WITH A MEAL 09/01/19  Yes Biagio Borg, MD  OMEPRAZOLE PO Take by mouth as needed.   Yes [provider]  simvastatin (ZOCOR) 40 MG tablet TAKE ONE TABLET BY MOUTH DAILY 08/23/20  Yes Biagio Borg, MD  amoxicillin (AMOXIL) 500 MG capsule Take by mouth. Patient not taking: No sig reported 06/06/20   [provider]  Dulaglutide (TRULICITY) 3 HB/7.1IR SOPN Inject 3 mg as directed once a week. 05/03/20   Biagio Borg, MD  Lancet Device MISC Use as directed three times daily E11.9 05/04/19   Biagio Borg, MD    Current Outpatient Medications  Medication Sig Dispense Refill   acetaminophen (TYLENOL) 500 MG tablet Take 1,000 mg by mouth every 8 (eight) hours as needed for mild pain or moderate pain.     amLODipine (NORVASC) 5 MG tablet Take 1 tablet (5 mg total) by mouth daily. 90 tablet 3   bimatoprost (LUMIGAN) 0.01 % SOLN Place 1 drop into both eyes at  bedtime.      Continuous Blood Gluc Receiver (FREESTYLE LIBRE 14 DAY READER) DEVI 1 Device by Does not apply route daily as needed. E11.9 1 each 1   Continuous Blood Gluc Sensor (FREESTYLE LIBRE 14 DAY SENSOR) MISC CHANGE EVERY 14 DAYS 100 each 3   losartan (COZAAR) 100 MG tablet TAKE ONE TABLET BY MOUTH DAILY 90 tablet 2   metFORMIN (GLUCOPHAGE) 500 MG tablet TAKE 2 TABLETS BY MOUTH TWO TIMES A DAY WITH A MEAL 360 tablet 2   OMEPRAZOLE PO Take by mouth as needed.     simvastatin (ZOCOR) 40 MG tablet TAKE ONE TABLET BY MOUTH DAILY 90 tablet 2   amoxicillin (AMOXIL) 500 MG capsule Take by mouth. (Patient not taking: No sig reported)     Dulaglutide  (TRULICITY) 3 VC/9.4WH SOPN Inject 3 mg as directed once a week. 6 mL 3   Lancet Device MISC Use as directed three times daily E11.9 300 each 3   Current Facility-Administered Medications  Medication Dose Route Frequency Provider Last Rate Last Admin   0.9 %  sodium chloride infusion  500 mL Intravenous Once Ladene Artist, MD        Allergies as of 10/31/2020 - Review Complete 10/31/2020  Allergen Reaction Noted   Ivp dye [iodinated diagnostic agents]  10/16/2011   Sulfamethoxazole  04/08/2006   Sulfur  06/06/1978    Family History  Problem Relation Age of Onset   Hypertension Mother    Hypertension Father    Colon cancer Neg Hx    Esophageal cancer Neg Hx    Colon polyps Neg Hx    Rectal cancer Neg Hx    Stomach cancer Neg Hx     Social History   Socioeconomic History   Marital status: Married    Spouse name: Not on file   Number of children: Not on file   Years of education: Not on file   Highest education level: Not on file  Occupational History   Not on file  Tobacco Use   Smoking status: Never   Smokeless tobacco: Never  Vaping Use   Vaping Use: Never used  Substance and Sexual Activity   Alcohol use: Yes    Comment: occ: social   Drug use: No   Sexual activity: Not on file  Other Topics Concern   Not on file  Social History Narrative   Not on file   Social Determinants of Health   Financial Resource Strain: Not on file  Food Insecurity: Not on file  Transportation Needs: Not on file  Physical Activity: Not on file  Stress: Not on file  Social Connections: Not on file  Intimate Partner Violence: Not on file    Review of Systems:  All systems reviewed an negative except where noted in HPI.  Gen: Denies any fever, chills, sweats, anorexia, fatigue, weakness, malaise, weight loss, and sleep disorder CV: Denies chest pain, angina, palpitations, syncope, orthopnea, PND, peripheral edema, and claudication. Resp: Denies dyspnea at rest, dyspnea  with exercise, cough, sputum, wheezing, coughing up blood, and pleurisy. GI: Denies vomiting blood, jaundice, and fecal incontinence.   Denies dysphagia or odynophagia. GU : Denies urinary burning, blood in urine, urinary frequency, urinary hesitancy, nocturnal urination, and urinary incontinence. MS: Denies joint pain, limitation of movement, and swelling, stiffness, low back pain, extremity pain. Denies muscle weakness, cramps, atrophy.  Derm: Denies rash, itching, dry skin, hives, moles, warts, or unhealing ulcers.  Psych: Denies depression, anxiety, memory loss, suicidal ideation,  hallucinations, paranoia, and confusion. Heme: Denies bruising, bleeding, and enlarged lymph nodes. Neuro:  Denies any headaches, dizziness, paresthesias. Endo:  Denies any problems with DM, thyroid, adrenal function.   Physical Exam: General:  Alert, well-developed, in NAD Head:  Normocephalic and atraumatic. Eyes:  Sclera clear, no icterus.   Conjunctiva pink. Ears:  Normal auditory acuity. Mouth:  No deformity or lesions.  Neck:  Supple; no masses . Lungs:  Clear throughout to auscultation.   No wheezes, crackles, or rhonchi. No acute distress. Heart:  Regular rate and rhythm; no murmurs. Abdomen:  Soft, nondistended, nontender. No masses, hepatomegaly. No obvious masses.  Normal bowel .    Rectal:  Deferred   Msk:  Symmetrical without gross deformities.. Pulses:  Normal pulses noted. Extremities:  Without edema. Neurologic:  Alert and  oriented x4;  grossly normal neurologically. Skin:  Intact without significant lesions or rashes. Cervical Nodes:  No significant cervical adenopathy. Psych:  Alert and cooperative. Normal mood and affect.   Impression / Plan:   Personal history of adenomatous colon polyps for surveillance colonoscopy.   This patient is appropriate for endoscopic procedures in the ambulatory setting.    Pricilla Riffle. Fuller Plan  10/31/2020, 11:37 AM

## 2020-11-01 ENCOUNTER — Other Ambulatory Visit: Payer: Self-pay | Admitting: Internal Medicine

## 2020-11-01 NOTE — Telephone Encounter (Signed)
Please refill as per office routine med refill policy (all routine meds to be refilled for 3 mo or monthly (per pt preference) up to one year from last visit, then month to month grace period for 3 mo, then further med refills will have to be denied) ? ?

## 2020-11-02 ENCOUNTER — Telehealth: Payer: Self-pay

## 2020-11-02 NOTE — Telephone Encounter (Signed)
  Follow up Call-  Call back number 10/31/2020  Post procedure Call Back phone  # ,[phone]  Permission to leave phone message Yes  Some recent data might be hidden     Patient questions:  Do you have a fever, pain , or abdominal swelling? No. Pain Score  0 *  Have you tolerated food without any problems? Yes.    Have you been able to return to your normal activities? Yes.    Do you have any questions about your discharge instructions: Diet   No. Medications  No. Follow up visit  No.  Do you have questions or concerns about your Care? No.  Actions: * If pain score is 4 or above: No action needed, pain <4.  Have you developed a fever since your procedure? no  2.   Have you had an respiratory symptoms (SOB or cough) since your procedure? no  3.   Have you tested positive for COVID 19 since your procedure no  4.   Have you had any family members/close contacts diagnosed with the COVID 19 since your procedure?  no   If yes to any of these questions please route to Joylene John, RN and Joella Prince, RN

## 2020-11-07 ENCOUNTER — Other Ambulatory Visit: Payer: Self-pay

## 2020-11-07 ENCOUNTER — Other Ambulatory Visit (INDEPENDENT_AMBULATORY_CARE_PROVIDER_SITE_OTHER): Payer: HMO

## 2020-11-07 DIAGNOSIS — E119 Type 2 diabetes mellitus without complications: Secondary | ICD-10-CM

## 2020-11-07 LAB — COMPREHENSIVE METABOLIC PANEL
ALT: 18 U/L (ref 0–35)
AST: 18 U/L (ref 0–37)
Albumin: 4.3 g/dL (ref 3.5–5.2)
Alkaline Phosphatase: 78 U/L (ref 39–117)
BUN: 19 mg/dL (ref 6–23)
CO2: 26 mEq/L (ref 19–32)
Calcium: 9.5 mg/dL (ref 8.4–10.5)
Chloride: 104 mEq/L (ref 96–112)
Creatinine, Ser: 0.87 mg/dL (ref 0.40–1.20)
GFR: 64.48 mL/min (ref >60.00)
Glucose, Bld: 159 mg/dL — ABNORMAL HIGH (ref 70–99)
Potassium: 4.9 mEq/L (ref 3.5–5.1)
Sodium: 139 mEq/L (ref 135–145)
Total Bilirubin: 0.9 mg/dL (ref 0.2–1.2)
Total Protein: 7 g/dL (ref 6.0–8.3)

## 2020-11-09 ENCOUNTER — Other Ambulatory Visit: Payer: Self-pay | Admitting: Internal Medicine

## 2020-11-09 DIAGNOSIS — Z1231 Encounter for screening mammogram for malignant neoplasm of breast: Secondary | ICD-10-CM

## 2020-11-10 ENCOUNTER — Other Ambulatory Visit: Payer: Self-pay

## 2020-11-10 ENCOUNTER — Ambulatory Visit
Admission: RE | Admit: 2020-11-10 | Discharge: 2020-11-10 | Disposition: A | Discharge status: Home / Self Care | Payer: HMO | Source: Ambulatory Visit | Attending: Internal Medicine | Admitting: Internal Medicine

## 2020-11-10 DIAGNOSIS — Z1231 Encounter for screening mammogram for malignant neoplasm of breast: Secondary | ICD-10-CM | POA: Diagnosis not present

## 2020-11-11 ENCOUNTER — Ambulatory Visit (INDEPENDENT_AMBULATORY_CARE_PROVIDER_SITE_OTHER): Payer: HMO | Admitting: Internal Medicine

## 2020-11-11 ENCOUNTER — Encounter: Payer: Self-pay | Admitting: Internal Medicine

## 2020-11-11 VITALS — BP 126/80 | HR 75 | Temp 98.3°F | Ht 65.5 in | Wt 194.0 lb

## 2020-11-11 DIAGNOSIS — Z23 Encounter for immunization: Secondary | ICD-10-CM | POA: Diagnosis not present

## 2020-11-11 DIAGNOSIS — E559 Vitamin D deficiency, unspecified: Secondary | ICD-10-CM

## 2020-11-11 DIAGNOSIS — E1165 Type 2 diabetes mellitus with hyperglycemia: Secondary | ICD-10-CM | POA: Diagnosis not present

## 2020-11-11 DIAGNOSIS — E7800 Pure hypercholesterolemia, unspecified: Secondary | ICD-10-CM | POA: Diagnosis not present

## 2020-11-11 DIAGNOSIS — I10 Essential (primary) hypertension: Secondary | ICD-10-CM

## 2020-11-11 DIAGNOSIS — E538 Deficiency of other specified B group vitamins: Secondary | ICD-10-CM | POA: Diagnosis not present

## 2020-11-11 LAB — POCT GLYCOSYLATED HEMOGLOBIN (HGB A1C): Hemoglobin A1C: 7 % — AB (ref 4.0–5.6)

## 2020-11-11 NOTE — Assessment & Plan Note (Signed)
Last vitamin D Lab Results  Component Value Date   VD25OH 34.06 05/02/2020   Low, to start oral replacement

## 2020-11-11 NOTE — Progress Notes (Signed)
Patient ID: [name] [name], female   DOB: [birth year], 75 y.o.   MRN: [id number]        Chief Complaint: follow up HTN, HLD and hyperglycemia, low vit d       HPI:  [name] [name] is a 75 y.o. female here overall doing ok.  Pt denies chest pain, increased sob or doe, wheezing, orthopnea, PND, increased LE swelling, palpitations, dizziness or syncope.   Pt denies polydipsia, polyuria, or new focal neuro s/s.   Pt denies fever, wt loss, night sweats, loss of appetite, or other constitutional symptoms   S/p covid infection may 2022.  Not taking Vit D   Wt Readings from Last 3 Encounters:  11/11/20 194 lb (88 kg)  10/31/20 190 lb (86.2 kg)  10/17/20 190 lb (86.2 kg)   BP Readings from Last 3 Encounters:  11/11/20 126/80  10/31/20 116/68  05/03/20 138/80         Past Medical History:  Diagnosis Date   Cataract    removed both eyes   Diverticulitis    10-06-2019, 11-27-2019   DIVERTICULOSIS, COLON 09/10/2007   DM 03/26/2008   GERD (gastroesophageal reflux disease)    Glaucoma 03/17/2011   HYPERLIPIDEMIA 09/10/2007   HYPERTENSION 09/10/2007   Hypertension    Osteoarthritis 10/21/2011   Shingles 03/17/2011   Past Surgical History:  Procedure Laterality Date   ABDOMINAL HYSTERECTOMY     COLONOSCOPY     HEMATOMA EVACUATION     2003; abdominal GI bleed 2nd-ary to Aspirin consumption   POLYPECTOMY     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 She has never used smokeless tobacco. She reports current alcohol use. She reports that she does not use drugs. family history includes Hypertension in her father  and mother. Allergies  Allergen Reactions   Ivp Dye [Iodinated Diagnostic Agents]    Sulfamethoxazole     REACTION: unspecified   Sulfur    Current Outpatient Medications on File Prior to Visit  Medication Sig Dispense Refill   acetaminophen (TYLENOL) 500 MG tablet Take 1,000 mg by mouth every 8 (eight) hours as needed for mild pain or moderate pain.     amLODipine (NORVASC) 5 MG tablet Take 1 tablet (5 mg total) by mouth daily. 90 tablet 3   amoxicillin (AMOXIL) 500 MG capsule Take by mouth.     bimatoprost (LUMIGAN) 0.01 % SOLN Place 1 drop into both eyes at bedtime.      Continuous Blood Gluc Receiver (FREESTYLE LIBRE 14 DAY READER) DEVI 1 Device by Does not apply route daily as needed. E11.9 1 each 1   Continuous Blood Gluc Sensor (FREESTYLE LIBRE 14 DAY SENSOR) MISC CHANGE EVERY 14 DAYS 100 each 3   dicyclomine (BENTYL) 10 MG capsule Take 1 capsule (10 mg total) by mouth 3 (three) times daily as needed for spasms. 90 capsule 3   Dulaglutide (TRULICITY) 3 JK/0.9FG SOPN Inject 3 mg as directed once a week. 6 mL 3   Lancet Device MISC Use as directed three times daily E11.9 300 each 3   losartan (COZAAR) 100 MG tablet TAKE  ONE TABLET BY MOUTH DAILY 90 tablet 2   metFORMIN (GLUCOPHAGE) 500 MG tablet TAKE 2 TABLETS BY MOUTH TWO TIMES A DAY WITH A MEAL 360 tablet 0   OMEPRAZOLE PO Take by mouth as needed.     simvastatin (ZOCOR) 40 MG tablet TAKE ONE TABLET BY MOUTH DAILY 90 tablet 2   No current facility-administered medications on file prior to visit.        ROS:  All others reviewed and negative.  Objective        PE:  BP 126/80 (BP Location: Right Arm, Patient Position: Sitting, Cuff Size: Large)   Pulse 75   Temp 98.3 F (36.8 C) (Oral)   Ht 5' 5.5" (1.664 m)   Wt 194 lb (88 kg)   SpO2 97%   BMI 31.79 kg/m                 Constitutional: Pt appears in NAD               HENT: Head: NCAT.                Right Ear: External ear normal.                 Left Ear: External  ear normal.                Eyes: . Pupils are equal, round, and reactive to light. Conjunctivae and EOM are normal               Nose: without d/c or deformity               Neck: Neck supple. Gross normal ROM               Cardiovascular: Normal rate and regular rhythm.                 Pulmonary/Chest: Effort normal and breath sounds without rales or wheezing.                Abd:  Soft, NT, ND, + BS, no organomegaly               Neurological: Pt is alert. At baseline orientation, motor grossly intact               Skin: Skin is warm. No rashes, no other new lesions, LE edema - none               Psychiatric: Pt behavior is normal without agitation   Micro: none  Cardiac tracings I have personally interpreted today:  none  Pertinent Radiological findings (summarize): none   Lab Results  Component Value Date   WBC 8.9 09/27/2020   HGB 15.2 (H) 09/27/2020   HCT 44.4 09/27/2020   PLT 286.0 09/27/2020   GLUCOSE 159 (H) 11/07/2020   CHOL 143 05/02/2020   TRIG 146.0 05/02/2020   HDL 46.40 05/02/2020   LDLDIRECT 65.5 03/24/2012   LDLCALC 67 05/02/2020   ALT 18 11/07/2020   AST 18 11/07/2020   NA 139 11/07/2020   K 4.9 11/07/2020   CL 104 11/07/2020   CREATININE 0.87 11/07/2020   BUN 19 11/07/2020   CO2 26 11/07/2020   TSH 3.26 05/02/2020   INR 0.93 03/06/2017   HGBA1C 7.0 (A) 11/11/2020   MICROALBUR 1.4 05/02/2020    Hemoglobin A1C 4.0 - 5.6 % 7.0 Abnormal   7.3 High  R, CM  7.6 High  R, CM  8.9 High  R, CM  9.2 High  R, CM  6.0 R, CM  6.9 High    Assessment/Plan:  [name] [name] is a 75 y.o. White or Caucasian [1] female with  has a past medical history of Cataract, Diverticulitis, DIVERTICULOSIS, COLON (09/10/2007), DM (03/26/2008), GERD (gastroesophageal reflux disease), Glaucoma (03/17/2011), HYPERLIPIDEMIA (09/10/2007), HYPERTENSION (09/10/2007), Hypertension, Osteoarthritis (10/21/2011), and Shingles (03/17/2011).  Vitamin D deficiency Last vitamin D Lab Results   Component Value Date   VD25OH 34.06 05/02/2020   Low, to start oral replacement   Diabetes mellitus ([place]) Lab Results  Component Value Date   HGBA1C 7.0 (A) 11/11/2020   Stable, pt to continue current medical treatment trulicity, metformin; also refer podaitry   Hyperlipidemia Lab Results  Component Value Date   LDLCALC 67 05/02/2020   Stable, pt to continue current statin zocor   Essential hypertension BP Readings from Last 3 Encounters:  11/11/20 126/80  10/31/20 116/68  05/03/20 138/80   Stable, pt to continue medical treatment novrasc, losartan  Followup: Return in about 6 months (around 05/12/2021).  Cathlean Cower, MD 11/12/2020 4:12 PM [facility]

## 2020-11-11 NOTE — Patient Instructions (Signed)
You had the flu shot today  Your A1c was [facility] today  Please continue all other medications as before, and refills have been done if requested.  Please have the pharmacy call with any other refills you may need.  Please continue your efforts at being more active, low cholesterol diet, and weight control.  Please keep your appointments with your specialists as you may have planned  You will be contacted regarding the referral for: podiatry  Please make an Appointment to return in 6 months, or sooner if needed, also with Lab Appointment for testing done 3-5 days before at the [place] (so this is for TWO appointments - please see the scheduling desk as you leave)  Due to the ongoing Covid 19 pandemic, our lab now requires an appointment for any labs done at our office.  If you need labs done and do not have an appointment, please call our office ahead of time to schedule before presenting to the lab for your testing.

## 2020-11-12 ENCOUNTER — Encounter: Payer: Self-pay | Admitting: Internal Medicine

## 2020-11-12 NOTE — Assessment & Plan Note (Signed)
BP Readings from Last 3 Encounters:  11/11/20 126/80  10/31/20 116/68  05/03/20 138/80   Stable, pt to continue medical treatment novrasc, losartan

## 2020-11-12 NOTE — Assessment & Plan Note (Signed)
Lab Results  Component Value Date   LDLCALC 67 05/02/2020   Stable, pt to continue current statin zocor

## 2020-11-12 NOTE — Assessment & Plan Note (Addendum)
Lab Results  Component Value Date   HGBA1C 7.0 (A) 11/11/2020   Stable, pt to continue current medical treatment trulicity, metformin; also refer podaitry

## 2020-12-06 LAB — HM DIABETES EYE EXAM

## 2021-02-08 ENCOUNTER — Other Ambulatory Visit: Payer: Self-pay | Admitting: Internal Medicine

## 2021-02-08 NOTE — Telephone Encounter (Signed)
Please refill as per office routine med refill policy (all routine meds to be refilled for 3 mo or monthly (per pt preference) up to one year from last visit, then month to month grace period for 3 mo, then further med refills will have to be denied) ? ?

## 2021-02-13 ENCOUNTER — Encounter: Payer: Self-pay | Admitting: Podiatry

## 2021-02-13 ENCOUNTER — Ambulatory Visit: Payer: HMO | Admitting: Podiatry

## 2021-02-13 ENCOUNTER — Other Ambulatory Visit: Payer: Self-pay

## 2021-02-13 DIAGNOSIS — Q828 Other specified congenital malformations of skin: Secondary | ICD-10-CM

## 2021-02-13 DIAGNOSIS — E1165 Type 2 diabetes mellitus with hyperglycemia: Secondary | ICD-10-CM | POA: Diagnosis not present

## 2021-02-13 NOTE — Progress Notes (Signed)
°  Subjective:  Patient ID: [name] [name], female    DOB: 1945-04-04,   MRN: [id number]  Chief Complaint  Patient presents with   debride    [facility] -bl nails trimming    76 y.o. female presents for diabetic foot check. Patient denies any issues or pain in her feet. Does relates a small area on her right foot that was painful after a recent trip. Believes it is a callus. Patient is diabetic and last A1c was 7.3 05/02/20  . Denies any other pedal complaints. Denies n/v/f/c.  PCP: Cathlean Cower MD    Past Medical History:  Diagnosis Date   Cataract    removed both eyes   Diverticulitis    10-06-2019, 11-27-2019   DIVERTICULOSIS, COLON 09/10/2007   DM 03/26/2008   GERD (gastroesophageal reflux disease)    Glaucoma 03/17/2011   HYPERLIPIDEMIA 09/10/2007   HYPERTENSION 09/10/2007   Hypertension    Osteoarthritis 10/21/2011   Shingles 03/17/2011    Objective:  Physical Exam: Vascular: DP/PT pulses 2/4 bilateral. CFT <3 seconds. Normal hair growth on digits. No edema.  Skin. No lacerations or abrasions bilateral feet. Hyperkeratotic tissue noted sub 5th metatarsal with cored area. Nails normal in appearance but elongated.  Musculoskeletal: MMT 5/5 bilateral lower extremities in DF, PF, Inversion and Eversion. Deceased ROM in DF of ankle joint.  Neurological: Sensation intact to light touch.   Assessment:   1. Type 2 diabetes mellitus with hyperglycemia, without long-term current use of insulin ([place])      Plan:  Patient was evaluated and treated and all questions answered. -Discussed and educated patient on diabetic foot care, especially with  regards to the vascular, neurological and musculoskeletal systems.  -Stressed the importance of good glycemic control and the detriment of not  controlling glucose levels in relation to the foot. -Discussed supportive shoes at all times and checking feet regularly.  -Mechanically debrided all nails 1-5 bilateral using sterile nail nipper and  filed with dremel without incident as courtesy.  -Hyperkeratotic tissue trimmed as courtesy.  -Answered all patient questions -Patient to return  in 1 year for diabetic foot check.  -Patient advised to call the office if any problems or questions arise in the meantime.   Lorenda Peck, DPM

## 2021-02-28 ENCOUNTER — Other Ambulatory Visit: Payer: Self-pay | Admitting: Internal Medicine

## 2021-02-28 NOTE — Telephone Encounter (Signed)
Please refill as per office routine med refill policy (all routine meds to be refilled for 3 mo or monthly (per pt preference) up to one year from last visit, then month to month grace period for 3 mo, then further med refills will have to be denied) ? ?

## 2021-04-25 DIAGNOSIS — H40053 Ocular hypertension, bilateral: Secondary | ICD-10-CM | POA: Diagnosis not present

## 2021-05-03 ENCOUNTER — Other Ambulatory Visit: Payer: Self-pay | Admitting: Internal Medicine

## 2021-05-03 NOTE — Telephone Encounter (Signed)
Please refill as per office routine med refill policy (all routine meds to be refilled for 3 mo or monthly (per pt preference) up to one year from last visit, then month to month grace period for 3 mo, then further med refills will have to be denied) ? ?

## 2021-05-16 ENCOUNTER — Other Ambulatory Visit (INDEPENDENT_AMBULATORY_CARE_PROVIDER_SITE_OTHER): Payer: HMO

## 2021-05-16 DIAGNOSIS — E1165 Type 2 diabetes mellitus with hyperglycemia: Secondary | ICD-10-CM

## 2021-05-16 DIAGNOSIS — E559 Vitamin D deficiency, unspecified: Secondary | ICD-10-CM

## 2021-05-16 DIAGNOSIS — E538 Deficiency of other specified B group vitamins: Secondary | ICD-10-CM | POA: Diagnosis not present

## 2021-05-16 LAB — CBC WITH DIFFERENTIAL/PLATELET
Basophils Absolute: 0.1 10*3/uL (ref 0.0–0.1)
Basophils Relative: 1.1 % (ref 0.0–3.0)
Eosinophils Absolute: 0.3 10*3/uL (ref 0.0–0.7)
Eosinophils Relative: 3.2 % (ref 0.0–5.0)
HCT: 45.1 % (ref 36.0–46.0)
Hemoglobin: 15.4 g/dL — ABNORMAL HIGH (ref 12.0–15.0)
Lymphocytes Relative: 29.3 % (ref 12.0–46.0)
Lymphs Abs: 2.5 10*3/uL (ref 0.7–4.0)
MCHC: 34.1 g/dL (ref 30.0–36.0)
MCV: 91.5 fl (ref 78.0–100.0)
Monocytes Absolute: 0.8 10*3/uL (ref 0.1–1.0)
Monocytes Relative: 9.1 % (ref 3.0–12.0)
Neutro Abs: 4.9 10*3/uL (ref 1.4–7.7)
Neutrophils Relative %: 57.3 % (ref 43.0–77.0)
Platelets: 270 10*3/uL (ref 150.0–400.0)
RBC: 4.93 Mil/uL (ref 3.87–5.11)
RDW: 12.8 % (ref 11.5–15.5)
WBC: 8.5 10*3/uL (ref 4.0–10.5)

## 2021-05-16 LAB — BASIC METABOLIC PANEL
BUN: 14 mg/dL (ref 6–23)
CO2: 23 mEq/L (ref 19–32)
Calcium: 9.4 mg/dL (ref 8.4–10.5)
Chloride: 103 mEq/L (ref 96–112)
Creatinine, Ser: 0.86 mg/dL (ref 0.40–1.20)
GFR: 65.14 mL/min (ref >60.00)
Glucose, Bld: 178 mg/dL — ABNORMAL HIGH (ref 70–99)
Potassium: 4.2 mEq/L (ref 3.5–5.1)
Sodium: 139 mEq/L (ref 135–145)

## 2021-05-16 LAB — URINALYSIS, ROUTINE W REFLEX MICROSCOPIC
Bilirubin Urine: NEGATIVE
Hgb urine dipstick: NEGATIVE
Ketones, ur: NEGATIVE
Nitrite: NEGATIVE
RBC / HPF: NONE SEEN (ref 0–?)
Specific Gravity, Urine: <=1.005 — AB (ref 1.000–1.030)
Total Protein, Urine: NEGATIVE
Urine Glucose: NEGATIVE
Urobilinogen, UA: 0.2 (ref 0.0–1.0)
pH: 6 (ref 5.0–8.0)

## 2021-05-16 LAB — HEPATIC FUNCTION PANEL
ALT: 20 U/L (ref 0–35)
AST: 24 U/L (ref 0–37)
Albumin: 4.4 g/dL (ref 3.5–5.2)
Alkaline Phosphatase: 60 U/L (ref 39–117)
Bilirubin, Direct: 0.2 mg/dL (ref 0.0–0.3)
Total Bilirubin: 1.2 mg/dL (ref 0.2–1.2)
Total Protein: 7.1 g/dL (ref 6.0–8.3)

## 2021-05-16 LAB — LIPID PANEL
Cholesterol: 138 mg/dL (ref 0–200)
HDL: 49 mg/dL (ref >39.00)
NonHDL: 88.73
Total CHOL/HDL Ratio: 3
Triglycerides: 234 mg/dL — ABNORMAL HIGH (ref 0.0–149.0)
VLDL: 46.8 mg/dL — ABNORMAL HIGH (ref 0.0–40.0)

## 2021-05-16 LAB — MICROALBUMIN / CREATININE URINE RATIO
Creatinine,U: 29 mg/dL
Microalb Creat Ratio: 3.6 mg/g (ref 0.0–30.0)
Microalb, Ur: 1 mg/dL (ref 0.0–1.9)

## 2021-05-16 LAB — TSH: TSH: 2.97 u[IU]/mL (ref 0.35–5.50)

## 2021-05-16 LAB — HEMOGLOBIN A1C: Hgb A1c MFr Bld: 7.8 % — ABNORMAL HIGH (ref 4.6–6.5)

## 2021-05-16 LAB — VITAMIN D 25 HYDROXY (VIT D DEFICIENCY, FRACTURES): VITD: 31.49 ng/mL (ref 30.00–100.00)

## 2021-05-16 LAB — VITAMIN B12: Vitamin B-12: 613 pg/mL (ref 211–911)

## 2021-05-16 LAB — LDL CHOLESTEROL, DIRECT: Direct LDL: 64 mg/dL

## 2021-05-19 ENCOUNTER — Encounter: Payer: Self-pay | Admitting: Internal Medicine

## 2021-05-19 ENCOUNTER — Ambulatory Visit (INDEPENDENT_AMBULATORY_CARE_PROVIDER_SITE_OTHER): Payer: HMO | Admitting: Internal Medicine

## 2021-05-19 VITALS — BP 128/80 | HR 80 | Temp 98.0°F | Ht 65.5 in | Wt 192.0 lb

## 2021-05-19 DIAGNOSIS — Z0000 Encounter for general adult medical examination without abnormal findings: Secondary | ICD-10-CM | POA: Diagnosis not present

## 2021-05-19 DIAGNOSIS — E7800 Pure hypercholesterolemia, unspecified: Secondary | ICD-10-CM

## 2021-05-19 DIAGNOSIS — E538 Deficiency of other specified B group vitamins: Secondary | ICD-10-CM

## 2021-05-19 DIAGNOSIS — E1165 Type 2 diabetes mellitus with hyperglycemia: Secondary | ICD-10-CM

## 2021-05-19 DIAGNOSIS — E559 Vitamin D deficiency, unspecified: Secondary | ICD-10-CM

## 2021-05-19 DIAGNOSIS — I10 Essential (primary) hypertension: Secondary | ICD-10-CM

## 2021-05-19 MED ORDER — OZEMPIC (0.25 OR 0.5 MG/DOSE) 2 MG/1.5ML ~~LOC~~ SOPN: 0.5000 mg | PEN_INJECTOR | SUBCUTANEOUS | 3 refills | Status: DC

## 2021-05-19 NOTE — Progress Notes (Signed)
Patient ID: [name] [name], female   DOB: Oct 20, 1945, 76 y.o.   MRN: [id number] ? ? ? ?     Chief Complaint:: wellness exam and low vit d, dm ? ?     HPI:  [name] [name] is a 76 y.o. female here for wellness exam; declines covid booster, shingrix, o/w up to date ?         ?              Also not taking Vit d.  Taking trulicity but not gettng any wt loss.  Pt denies chest pain, increased sob or doe, wheezing, orthopnea, PND, increased LE swelling, palpitations, dizziness or syncope.   Pt denies polydipsia, polyuria, or new focal neuro s/s.    Pt denies fever, wt loss, night sweats, loss of appetite, or other constitutional symptoms   ?  ?Wt Readings from Last 3 Encounters:  ?05/19/21 192 lb (87.1 kg)  ?11/11/20 194 lb (88 kg)  ?10/31/20 190 lb (86.2 kg)  ? ?BP Readings from Last 3 Encounters:  ?05/19/21 128/80  ?11/11/20 126/80  ?10/31/20 116/68  ? ?Immunization History  ?Administered Date(s) Administered  ? Fluad Quad(high Dose 65+) 10/29/2018, 11/04/2019, 11/11/2020  ? Influenza Split 10/16/2011  ? Influenza Whole 11/02/2008, 12/06/2009  ? Influenza, High Dose Seasonal PF 11/08/2016, 11/27/2017  ? Influenza,inj,Quad PF,6+ Mos 10/29/2013  ? Influenza-Unspecified 11/06/2015  ? PFIZER(Purple Top)SARS-COV-2 Vaccination 03/09/2019, 03/30/2019, 11/06/2019, 08/18/2020  ? Pneumococcal Conjugate-13 06/25/2013  ? Pneumococcal Polysaccharide-23 11/02/2008, 10/29/2018  ? Td 02/05/2005  ? ?There are no preventive care reminders to display for this patient. ? ?  ? ?Past Medical History:  ?Diagnosis Date  ? Cataract   ? removed both eyes  ? Diverticulitis   ? 10-06-2019, 11-27-2019  ? DIVERTICULOSIS, COLON 09/10/2007  ? DM 03/26/2008  ? GERD (gastroesophageal reflux disease)   ? Glaucoma 03/17/2011  ? HYPERLIPIDEMIA 09/10/2007  ? HYPERTENSION 09/10/2007  ? Hypertension   ? Osteoarthritis 10/21/2011  ? Shingles 03/17/2011  ? ?Past Surgical History:  ?Procedure Laterality Date  ? ABDOMINAL HYSTERECTOMY    ? COLONOSCOPY    ? HEMATOMA  EVACUATION    ? 2003; abdominal GI bleed 2nd-ary to Aspirin consumption  ? POLYPECTOMY    ? 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  ? Procedure: LEFT TOTAL KNEE ARTHROPLASTY;  Surgeon: Gaynelle Arabian, MD;  Location: [facility];  Service: Orthopedics;  Laterality: Left;  with canal block  ? TOTAL KNEE ARTHROPLASTY Right 03/11/2017  ? Procedure: RIGHT TOTAL KNEE ARTHROPLASTY;  Surgeon: Gaynelle Arabian, MD;  Location: [facility];  Service: Orthopedics;  Laterality: Right;  ? TUBAL LIGATION    ? ? reports that she has never smoked. She has never used smokeless tobacco. She reports current alcohol use. She reports that she does not use drugs. ?family history includes Hypertension in her father and mother. ?Allergies  ?Allergen Reactions  ? Ivp Dye [Iodinated Contrast Media]   ? Sulfamethoxazole   ?  REACTION: unspecified  ? Sulfur   ? ?Current Outpatient Medications on File Prior to Visit  ?Medication Sig Dispense Refill  ? acetaminophen (TYLENOL) 500 MG tablet Take 1,000 mg by mouth every 8 (eight) hours as needed for mild pain or moderate pain.    ? amLODipine (NORVASC) 5 MG tablet TAKE ONE TABLET BY MOUTH DAILY 90 tablet 3  ? bimatoprost (LUMIGAN) 0.01 % SOLN Place 1 drop into both eyes at bedtime.     ? Continuous Blood Gluc Receiver (FREESTYLE LIBRE 14 DAY READER) DEVI  1 Device by Does not apply route daily as needed. E11.9 1 each 1  ? Continuous Blood Gluc Sensor (FREESTYLE LIBRE 14 DAY SENSOR) MISC CHANGE EVERY 14 DAYS 100 each 3  ? Dulaglutide (TRULICITY) 3 ZW/2.5EN SOPN INJECT 3 MG ONCE WEEKLY AS INSTRUCTED 2 mL 0  ? Lancet Device MISC Use as directed three times daily E11.9 300 each 3  ? losartan (COZAAR) 100 MG tablet TAKE ONE TABLET BY MOUTH DAILY 90 tablet 2  ? metFORMIN (GLUCOPHAGE) 500 MG tablet TAKE 2 TABLETS BY MOUTH TWO TIMES A DAY WITH A MEAL 360 tablet 2  ? simvastatin (ZOCOR) 40 MG tablet TAKE ONE TABLET BY MOUTH DAILY 90 tablet 2  ? ?No current facility-administered medications on file prior to visit.  ? ?      ROS:  All others reviewed and negative. ? ?Objective  ? ?     PE:  BP 128/80 (BP Location: Right Arm, Patient Position: Sitting, Cuff Size: Normal)   Pulse 80   Temp 98 ?F (36.7 ?C) (Oral)   Ht 5' 5.5" (1.664 m)   Wt 192 lb (87.1 kg)   SpO2 97%   BMI 31.46 kg/m?  ? ?              Constitutional: Pt appears in NAD ?              HENT: Head: NCAT.  ?              Right Ear: External ear normal.   ?              Left Ear: External ear normal.  ?              Eyes: . Pupils are equal, round, and reactive to light. Conjunctivae and EOM are normal ?              Nose: without d/c or deformity ?              Neck: Neck supple. Gross normal ROM ?              Cardiovascular: Normal rate and regular rhythm.   ?              Pulmonary/Chest: Effort normal and breath sounds without rales or wheezing.  ?              Abd:  Soft, NT, ND, + BS, no organomegaly ?              Neurological: Pt is alert. At baseline orientation, motor grossly intact ?              Skin: Skin is warm. No rashes, no other new lesions, LE edema - none ?              Psychiatric: Pt behavior is normal without agitation  ? ?Micro: none ? ?Cardiac tracings I have personally interpreted today:  none ? ?Pertinent Radiological findings (summarize): none  ? ?Lab Results  ?Component Value Date  ? WBC 8.5 05/16/2021  ? HGB 15.4 (H) 05/16/2021  ? HCT 45.1 05/16/2021  ? PLT 270.0 05/16/2021  ? GLUCOSE 178 (H) 05/16/2021  ? CHOL 138 05/16/2021  ? TRIG 234.0 (H) 05/16/2021  ? HDL 49.00 05/16/2021  ? LDLDIRECT 64.0 05/16/2021  ? [place] 67 05/02/2020  ? ALT 20 05/16/2021  ? AST 24 05/16/2021  ? NA 139 05/16/2021  ? K 4.2 05/16/2021  ? CL 103 05/16/2021  ?  CREATININE 0.86 05/16/2021  ? BUN 14 05/16/2021  ? CO2 23 05/16/2021  ? TSH 2.97 05/16/2021  ? INR 0.93 03/06/2017  ? HGBA1C 7.8 (H) 05/16/2021  ? MICROALBUR 1.0 05/16/2021  ? ?Assessment/Plan:  ?[name] [name] is a 76 y.o. White or Caucasian [1] female with  has a past medical history of Cataract,  Diverticulitis, DIVERTICULOSIS, COLON (09/10/2007), DM (03/26/2008), GERD (gastroesophageal reflux disease), Glaucoma (03/17/2011), HYPERLIPIDEMIA (09/10/2007), HYPERTENSION (09/10/2007), Hypertension, Osteoarthritis (10/21/2011), and Shingles (03/17/2011). ? ?Wellness examination ?Age and sex appropriate education and counseling updated with regular exercise and diet ?Referrals for preventative services - none needed ?Immunizations addressed - deciens covid booster, shingrix ?Smoking counseling  - none needed ?Evidence for depression or other mood disorder - none significant ?Most recent labs reviewed. ?I have personally reviewed and have noted: ?1) the patient's medical and social history ?2) The patient's current medications and supplements ?3) The patient's height, weight, and BMI have been recorded in the chart ? ? ?Diabetes mellitus ([place]) ?Unable for wt loss, ok to change trulicity to ozempic ? ?Vitamin D deficiency ?Last vitamin D ?Lab Results  ?Component Value Date  ? VD25OH 31.49 05/16/2021  ? ?Low, to start oral replacement ? ?Followup: No follow-ups on file. ? ?Cathlean Cower, MD 05/20/2021 9:44 PM ?[facility] ?[place] ?Internal Medicine ?

## 2021-05-19 NOTE — Patient Instructions (Addendum)
Please consider Shingrix at the [facility] or similar pharmacy ? ?Ok to start the ozempic after the trulicity is done, and call in 1 month for the higher strength if tolerated ok ? ?Please continue all other medications as before, and refills have been done if requested. ? ?Please have the pharmacy call with any other refills you may need. ? ?Please continue your efforts at being more active, low cholesterol diet, and weight control. ? ?You are otherwise up to date with prevention measures today. ? ?Please keep your appointments with your specialists as you may have planned ? ?Please make an Appointment to return in 6 months, or sooner if needed, also with Lab Appointment for testing done 3-5 days before at the [facility] [facility] (so this is for TWO appointments - please see the scheduling desk as you leave) ? ?Due to the ongoing Covid 19 pandemic, our lab now requires an appointment for any labs done at our office.  If you need labs done and do not have an appointment, please call our office ahead of time to schedule before presenting to the lab for your testing. ? ? ? ? ? ? ?

## 2021-05-20 ENCOUNTER — Encounter: Payer: Self-pay | Admitting: Internal Medicine

## 2021-05-20 NOTE — Assessment & Plan Note (Signed)
Age and sex appropriate education and counseling updated with regular exercise and diet ?Referrals for preventative services - none needed ?Immunizations addressed - deciens covid booster, shingrix ?Smoking counseling  - none needed ?Evidence for depression or other mood disorder - none significant ?Most recent labs reviewed. ?I have personally reviewed and have noted: ?1) the patient's medical and social history ?2) The patient's current medications and supplements ?3) The patient's height, weight, and BMI have been recorded in the chart ? ?

## 2021-05-20 NOTE — Assessment & Plan Note (Signed)
Unable for wt loss, ok to change trulicity to ozempic ?

## 2021-05-25 ENCOUNTER — Other Ambulatory Visit: Payer: Self-pay | Admitting: Internal Medicine

## 2021-05-25 NOTE — Telephone Encounter (Signed)
Please refill as per office routine med refill policy (all routine meds to be refilled for 3 mo or monthly (per pt preference) up to one year from last visit, then month to month grace period for 3 mo, then further med refills will have to be denied) ? ?

## 2021-06-04 ENCOUNTER — Other Ambulatory Visit: Payer: Self-pay | Admitting: Internal Medicine

## 2021-07-31 DIAGNOSIS — Z85828 Personal history of other malignant neoplasm of skin: Secondary | ICD-10-CM | POA: Diagnosis not present

## 2021-07-31 DIAGNOSIS — D2239 Melanocytic nevi of other parts of face: Secondary | ICD-10-CM | POA: Diagnosis not present

## 2021-07-31 DIAGNOSIS — L821 Other seborrheic keratosis: Secondary | ICD-10-CM | POA: Diagnosis not present

## 2021-07-31 DIAGNOSIS — Z08 Encounter for follow-up examination after completed treatment for malignant neoplasm: Secondary | ICD-10-CM | POA: Diagnosis not present

## 2021-07-31 DIAGNOSIS — L738 Other specified follicular disorders: Secondary | ICD-10-CM | POA: Diagnosis not present

## 2021-08-22 DIAGNOSIS — H40013 Open angle with borderline findings, low risk, bilateral: Secondary | ICD-10-CM | POA: Diagnosis not present

## 2021-09-26 ENCOUNTER — Encounter: Payer: Self-pay | Admitting: Internal Medicine

## 2021-09-27 MED ORDER — TRULICITY 3 MG/0.5ML ~~LOC~~ SOAJ: SUBCUTANEOUS | 11 refills | Status: DC

## 2021-09-27 NOTE — Telephone Encounter (Signed)
Patient is requesting a refill for Trulicity instead of ozempic.

## 2021-11-10 ENCOUNTER — Other Ambulatory Visit: Payer: Self-pay | Admitting: Internal Medicine

## 2021-11-14 ENCOUNTER — Encounter: Payer: Self-pay | Admitting: Internal Medicine

## 2021-11-14 ENCOUNTER — Ambulatory Visit (INDEPENDENT_AMBULATORY_CARE_PROVIDER_SITE_OTHER): Payer: HMO | Admitting: Internal Medicine

## 2021-11-14 VITALS — BP 120/78 | HR 75 | Temp 98.7°F | Ht 65.0 in | Wt 190.0 lb

## 2021-11-14 DIAGNOSIS — E538 Deficiency of other specified B group vitamins: Secondary | ICD-10-CM | POA: Diagnosis not present

## 2021-11-14 DIAGNOSIS — E1165 Type 2 diabetes mellitus with hyperglycemia: Secondary | ICD-10-CM

## 2021-11-14 DIAGNOSIS — R9431 Abnormal electrocardiogram [ECG] [EKG]: Secondary | ICD-10-CM

## 2021-11-14 DIAGNOSIS — I10 Essential (primary) hypertension: Secondary | ICD-10-CM

## 2021-11-14 DIAGNOSIS — E7800 Pure hypercholesterolemia, unspecified: Secondary | ICD-10-CM

## 2021-11-14 DIAGNOSIS — Z23 Encounter for immunization: Secondary | ICD-10-CM

## 2021-11-14 DIAGNOSIS — E559 Vitamin D deficiency, unspecified: Secondary | ICD-10-CM | POA: Diagnosis not present

## 2021-11-14 NOTE — Assessment & Plan Note (Signed)
BP Readings from Last 3 Encounters:  11/14/21 120/78  05/19/21 128/80  11/11/20 126/80   Stable, pt to continue medical treatment norvasc 5 mg qd, losartan 100 mg qd

## 2021-11-14 NOTE — Progress Notes (Signed)
Patient ID: [name] [name], female   DOB: 12-Sep-1945, 76 y.o.   MRN: [id number]        Chief Complaint: follow up HTN, HLD and hyperglycemia        HPI:  [name] [name] is a 76 y.o. female here overall doing ok, Pt denies chest pain, increased sob or doe, wheezing, orthopnea, PND, increased LE swelling, palpitations, dizziness or syncope.   Pt denies polydipsia, polyuria, or new focal neuro s/s.   Not takiing the ozempic, made her appetite increased and just not feeling good.  Needs referral DM education.  Willing for Cardiac CT score.  Due for flu shot   Maybe considering retiring soon.   Wt Readings from Last 3 Encounters:  11/14/21 190 lb (86.2 kg)  05/19/21 192 lb (87.1 kg)  11/11/20 194 lb (88 kg)   BP Readings from Last 3 Encounters:  11/14/21 120/78  05/19/21 128/80  11/11/20 126/80         Past Medical History:  Diagnosis Date   Cataract    removed both eyes   Diverticulitis    10-06-2019, 11-27-2019   DIVERTICULOSIS, COLON 09/10/2007   DM 03/26/2008   GERD (gastroesophageal reflux disease)    Glaucoma 03/17/2011   HYPERLIPIDEMIA 09/10/2007   HYPERTENSION 09/10/2007   Hypertension    Osteoarthritis 10/21/2011   Shingles 03/17/2011   Past Surgical History:  Procedure Laterality Date   ABDOMINAL HYSTERECTOMY     COLONOSCOPY     HEMATOMA EVACUATION     2003; abdominal GI bleed 2nd-ary to Aspirin consumption   POLYPECTOMY     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 She has never used smokeless tobacco. She reports current alcohol use. She reports that she does not use drugs. family  history includes Hypertension in her father and mother. Allergies  Allergen Reactions   Ivp Dye [Iodinated Contrast Media]    Sulfamethoxazole     REACTION: unspecified   Sulfur    Current Outpatient Medications on File Prior to Visit  Medication Sig Dispense Refill   acetaminophen (TYLENOL) 500 MG tablet Take 1,000 mg by mouth every 8 (eight) hours as needed for mild pain or moderate pain.     amLODipine (NORVASC) 5 MG tablet TAKE ONE TABLET BY MOUTH DAILY 90 tablet 3   bimatoprost (LUMIGAN) 0.01 % SOLN Place 1 drop into both eyes at bedtime.      Continuous Blood Gluc Receiver (FREESTYLE LIBRE 14 DAY READER) DEVI 1 Device by Does not apply route daily as needed. E11.9 1 each 1   Continuous Blood Gluc Sensor (FREESTYLE LIBRE 14 DAY SENSOR) MISC CHANGE EVERY 14 DAYS 6 each 3   Dulaglutide (TRULICITY) 3 PJ/8.2NK SOPN INJECT 3 MG ONCE WEEKLY AS INSTRUCTED 2 mL 11   Lancet Device MISC Use as directed three times daily E11.9 300 each 3   losartan (COZAAR) 100 MG tablet TAKE ONE TABLET BY MOUTH DAILY 90 tablet 3   metFORMIN (GLUCOPHAGE) 500 MG tablet TAKE TWO TABLETS BY MOUTH TWO TIMES A DAY WITH A MEAL 360 tablet 2   simvastatin (ZOCOR) 40 MG  tablet TAKE ONE TABLET BY MOUTH DAILY 90 tablet 3   No current facility-administered medications on file prior to visit.        ROS:  All others reviewed and negative.  Objective        PE:  BP 120/78 (BP Location: Right Arm, Patient Position: Sitting, Cuff Size: Normal)   Pulse 75   Temp 98.7 F (37.1 C) (Oral)   Ht '5\' 5"'$  (1.651 m)   Wt 190 lb (86.2 kg)   SpO2 98%   BMI 31.62 kg/m                 Constitutional: Pt appears in NAD               HENT: Head: NCAT.                Right Ear: External ear normal.                 Left Ear: External ear normal.                Eyes: . Pupils are equal, round, and reactive to light. Conjunctivae and EOM are normal               Nose: without d/c or deformity               Neck: Neck supple. Gross  normal ROM               Cardiovascular: Normal rate and regular rhythm.                 Pulmonary/Chest: Effort normal and breath sounds without rales or wheezing.                Abd:  Soft, NT, ND, + BS, no organomegaly               Neurological: Pt is alert. At baseline orientation, motor grossly intact               Skin: Skin is warm. No rashes, no other new lesions, LE edema - none               Psychiatric: Pt behavior is normal without agitation   Micro: none  Cardiac tracings I have personally interpreted today:  none  Pertinent Radiological findings (summarize): none   Lab Results  Component Value Date   WBC 8.5 05/16/2021   HGB 15.4 (H) 05/16/2021   HCT 45.1 05/16/2021   PLT 270.0 05/16/2021   GLUCOSE 178 (H) 05/16/2021   CHOL 138 05/16/2021   TRIG 234.0 (H) 05/16/2021   HDL 49.00 05/16/2021   LDLDIRECT 64.0 05/16/2021   LDLCALC 67 05/02/2020   ALT 20 05/16/2021   AST 24 05/16/2021   NA 139 05/16/2021   K 4.2 05/16/2021   CL 103 05/16/2021   CREATININE 0.86 05/16/2021   BUN 14 05/16/2021   CO2 23 05/16/2021   TSH 2.97 05/16/2021   INR 0.93 03/06/2017   HGBA1C 7.8 (H) 05/16/2021   MICROALBUR 1.0 05/16/2021   Assessment/Plan:  [name] [name] is a 76 y.o. White or Caucasian [1] female with  has a past medical history of Cataract, Diverticulitis, DIVERTICULOSIS, COLON (09/10/2007), DM (03/26/2008), GERD (gastroesophageal reflux disease), Glaucoma (03/17/2011), HYPERLIPIDEMIA (09/10/2007), HYPERTENSION (09/10/2007), Hypertension, Osteoarthritis (10/21/2011), and Shingles (03/17/2011).  Diabetes mellitus ([place]) Uncontrolled, unable to tolerate ozempic, declines med change for now, cont metformin 1000 bid, refer DM education , also for cardiac CT  score  Lab Results  Component Value Date   HGBA1C 7.8 (H) 05/16/2021     Essential hypertension BP Readings from Last 3 Encounters:  11/14/21 120/78  05/19/21 128/80  11/11/20 126/80   Stable, pt to continue  medical treatment norvasc 5 mg qd, losartan 100 mg qd   Hyperlipidemia Lab Results  Component Value Date   LDLCALC 67 05/02/2020   Stable, pt to continue current statin zocor 40 mg qd   Vitamin D deficiency . Last vitamin D Lab Results  Component Value Date   VD25OH 31.49 05/16/2021   Low, to start oral replacement  Followup: No follow-ups on file.  Cathlean Cower, MD 11/14/2021 1:10 PM [facility]

## 2021-11-14 NOTE — Assessment & Plan Note (Signed)
.   Last vitamin D Lab Results  Component Value Date   VD25OH 31.49 05/16/2021   Low, to start oral replacement

## 2021-11-14 NOTE — Progress Notes (Signed)
Patient ID: [name] [name], female   DOB: Aug 26, 1945, 76 y.o.   MRN: [id number]

## 2021-11-14 NOTE — Assessment & Plan Note (Signed)
Lab Results  Component Value Date   LDLCALC 67 05/02/2020   Stable, pt to continue current statin zocor 40 mg qd

## 2021-11-14 NOTE — Patient Instructions (Signed)
You had the flu shot today  Please continue all other medications as before, and refills have been done if requested.  Please have the pharmacy call with any other refills you may need.  Please continue your efforts at being more active, low cholesterol diet, and weight control.  Please keep your appointments with your specialists as you may have planned  You will be contacted regarding the referral for: DM management, and the Cardiac CT score  Please go to the LAB at the blood drawing area for the tests to be done  You will be contacted by phone if any changes need to be made immediately.  Otherwise, you will receive a letter about your results with an explanation, but please check with MyChart first.  Please remember to sign up for MyChart if you have not done so, as this will be important to you in the future with finding out test results, communicating by private email, and scheduling acute appointments online when needed.  Please make an Appointment to return in 6 months, or sooner if needed, also with Lab Appointment for testing done 3-5 days before at the [facility] (so this is for TWO appointments - please see the scheduling desk as you leave)

## 2021-11-14 NOTE — Assessment & Plan Note (Signed)
Uncontrolled, unable to tolerate ozempic, declines med change for now, cont metformin 1000 bid, refer DM education , also for cardiac CT score  Lab Results  Component Value Date   HGBA1C 7.8 (H) 05/16/2021

## 2022-02-06 ENCOUNTER — Telehealth: Payer: HMO | Admitting: Nurse Practitioner

## 2022-02-06 DIAGNOSIS — N3000 Acute cystitis without hematuria: Secondary | ICD-10-CM

## 2022-02-06 MED ORDER — CEPHALEXIN 500 MG PO CAPS: 500.0000 mg | ORAL_CAPSULE | Freq: Two times a day (BID) | ORAL | 0 refills | Status: AC

## 2022-02-06 NOTE — Progress Notes (Signed)
Virtual Visit Consent   [name], you are scheduled for a virtual visit with a [facility] provider today. Just as with appointments in the office, your consent must be obtained to participate. Your consent will be active for this visit and any virtual visit you may have with one of our providers in the next 365 days. If you have a MyChart account, a copy of this consent can be sent to you electronically.  As this is a virtual visit, video technology does not allow for your provider to perform a traditional examination. This may limit your provider's ability to fully assess your condition. If your provider identifies any concerns that need to be evaluated in person or the need to arrange testing (such as labs, EKG, etc.), we will make arrangements to do so. Although advances in technology are sophisticated, we cannot ensure that it will always work on either your end or our end. If the connection with a video visit is poor, the visit may have to be switched to a telephone visit. With either a video or telephone visit, we are not always able to ensure that we have a secure connection.  By engaging in this virtual visit, you consent to the provision of healthcare and authorize for your insurance to be billed (if applicable) for the services provided during this visit. Depending on your insurance coverage, you may receive a charge related to this service.  I need to obtain your verbal consent now. Are you willing to proceed with your visit today? [name] [name] has provided verbal consent on 02/06/2022 for a virtual visit (video or telephone). Apolonio Schneiders, FNP  Date: 02/06/2022 4:59 PM  Virtual Visit via Video Note   I, Apolonio Schneiders, connected with  [name] [name]  ([id number], 22-Oct-1945) on 02/06/22 at  5:00 PM EST by a video-enabled telemedicine application and verified that I am speaking with the correct person using two identifiers.  Location: Patient: Virtual Visit Location Patient:  Home Provider: Virtual Visit Location Provider: Home Office   I discussed the limitations of evaluation and management by telemedicine and the availability of in person appointments. The patient expressed understanding and agreed to proceed.    History of Present Illness: [name] [name] is a 77 y.o. who identifies as a female who was assigned female at birth, and is being seen today for painful urination. This has been bothering her for the past 48 hours without fever N/V. Most recent UTI was in 2022.   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   Atrophic vaginitis 03/23/2020   Trochanteric bursitis of left hip 08/16/2017   Wellness examination 05/11/2017   Cough 05/11/2017   Stiffness of right knee 03/26/2017   History of arthroplasty of left knee 03/05/2017   Bleeding diathesis ([place]) 05/31/2016   Blood coagulation disorder ([place]) 05/31/2016   Arthritis of knee 07/20/2015   Knee pain 07/06/2015   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  Basal cell carcinoma of skin 06/29/2015   Osteoarthritis 10/21/2011   Right-sided sensorineural hearing loss 10/21/2011   Glaucoma 03/17/2011   Herpes zoster 03/17/2011   Menopausal and postmenopausal disorder 12/02/2008   Diabetes mellitus ([place]) 03/26/2008   Hyperlipidemia 09/10/2007   Essential hypertension 09/10/2007   Diverticulosis of colon 09/10/2007   EPISTAXIS, RECURRENT 09/10/2007    Allergies:  Allergies  Allergen  Reactions   Ivp Dye [Iodinated Contrast Media]    Sulfamethoxazole     REACTION: unspecified   Sulfur    Medications:  Current Outpatient Medications:    acetaminophen (TYLENOL) 500 MG tablet, Take 1,000 mg by mouth every 8 (eight) hours as needed for mild pain or moderate pain., Disp: , Rfl:    amLODipine (NORVASC) 5 MG tablet, TAKE ONE TABLET BY MOUTH DAILY, Disp: 90 tablet, Rfl: 3   bimatoprost (LUMIGAN) 0.01 % SOLN, Place 1 drop into both eyes at bedtime. , Disp: , Rfl:    Continuous Blood Gluc Receiver (FREESTYLE LIBRE 14 DAY READER) DEVI, 1 Device by Does not apply route daily as needed. E11.9, Disp: 1 each, Rfl: 1   Continuous Blood Gluc Sensor (FREESTYLE LIBRE 14 DAY SENSOR) MISC, CHANGE EVERY 14 DAYS, Disp: 6 each, Rfl: 3   Dulaglutide (TRULICITY) 3 WU/9.8JX SOPN, INJECT 3 MG ONCE WEEKLY AS INSTRUCTED, Disp: 2 mL, Rfl: 11   Lancet Device MISC, Use as directed three times daily E11.9, Disp: 300 each, Rfl: 3   losartan (COZAAR) 100 MG tablet, TAKE ONE TABLET BY MOUTH DAILY, Disp: 90 tablet, Rfl: 3   metFORMIN (GLUCOPHAGE) 500 MG tablet, TAKE TWO TABLETS BY MOUTH TWO TIMES A DAY WITH A MEAL, Disp: 360 tablet, Rfl: 2   simvastatin (ZOCOR) 40 MG tablet, TAKE ONE TABLET BY MOUTH DAILY, Disp: 90 tablet, Rfl: 3  Observations/Objective: Patient is well-developed, well-nourished in no acute distress.  Resting comfortably  at home.  Head is normocephalic, atraumatic.  No labored breathing.  Speech is clear and coherent with logical content.  Patient is alert and oriented at baseline.    Assessment and Plan: 1. Acute cystitis without hematuria Meds ordered this encounter  Medications   cephALEXin (KEFLEX) 500 MG capsule    Sig: Take 1 capsule (500 mg total) by mouth 2 (two) times daily for 7 days.    Dispense:  14 capsule    Refill:  0        Follow Up Instructions: I discussed the assessment and treatment plan with the patient. The patient was provided an opportunity to ask  questions and all were answered. The patient agreed with the plan and demonstrated an understanding of the instructions.  A copy of instructions were sent to the patient via MyChart unless otherwise noted below.    The patient was advised to call back or seek an in-person evaluation if the symptoms worsen or if the condition fails to improve as anticipated.  Time:  I spent 10 minutes with the patient via telehealth technology discussing the above problems/concerns.    Apolonio Schneiders, FNP

## 2022-02-09 DIAGNOSIS — R109 Unspecified abdominal pain: Secondary | ICD-10-CM | POA: Diagnosis not present

## 2022-02-09 DIAGNOSIS — Z7985 Long-term (current) use of injectable non-insulin antidiabetic drugs: Secondary | ICD-10-CM | POA: Diagnosis not present

## 2022-02-09 DIAGNOSIS — E119 Type 2 diabetes mellitus without complications: Secondary | ICD-10-CM | POA: Diagnosis not present

## 2022-02-09 DIAGNOSIS — Z91041 Radiographic dye allergy status: Secondary | ICD-10-CM | POA: Diagnosis not present

## 2022-02-09 DIAGNOSIS — R1013 Epigastric pain: Secondary | ICD-10-CM | POA: Diagnosis not present

## 2022-02-09 DIAGNOSIS — N3289 Other specified disorders of bladder: Secondary | ICD-10-CM | POA: Diagnosis not present

## 2022-02-09 DIAGNOSIS — Z888 Allergy status to other drugs, medicaments and biological substances status: Secondary | ICD-10-CM | POA: Diagnosis not present

## 2022-02-09 DIAGNOSIS — K5732 Diverticulitis of large intestine without perforation or abscess without bleeding: Secondary | ICD-10-CM | POA: Diagnosis not present

## 2022-02-09 DIAGNOSIS — I10 Essential (primary) hypertension: Secondary | ICD-10-CM | POA: Diagnosis not present

## 2022-02-09 DIAGNOSIS — K760 Fatty (change of) liver, not elsewhere classified: Secondary | ICD-10-CM | POA: Diagnosis not present

## 2022-02-09 DIAGNOSIS — Z79899 Other long term (current) drug therapy: Secondary | ICD-10-CM | POA: Diagnosis not present

## 2022-02-09 DIAGNOSIS — K449 Diaphragmatic hernia without obstruction or gangrene: Secondary | ICD-10-CM | POA: Diagnosis not present

## 2022-02-13 IMAGING — MG MM DIGITAL SCREENING BILAT W/ TOMO AND CAD
6 of 10 series · 6 of 30 positions shown · non-contrast
Comparison: Previous exam(s).

CLINICAL DATA: Screening.

EXAM:
DIGITAL SCREENING BILATERAL MAMMOGRAM WITH TOMOSYNTHESIS AND CAD
TECHNIQUE: Bilateral screening digital craniocaudal and mediolateral oblique
mammograms were obtained. Bilateral screening digital breast
tomosynthesis was performed. The images were evaluated with
computer-aided detection.

[R CC synth-2D]
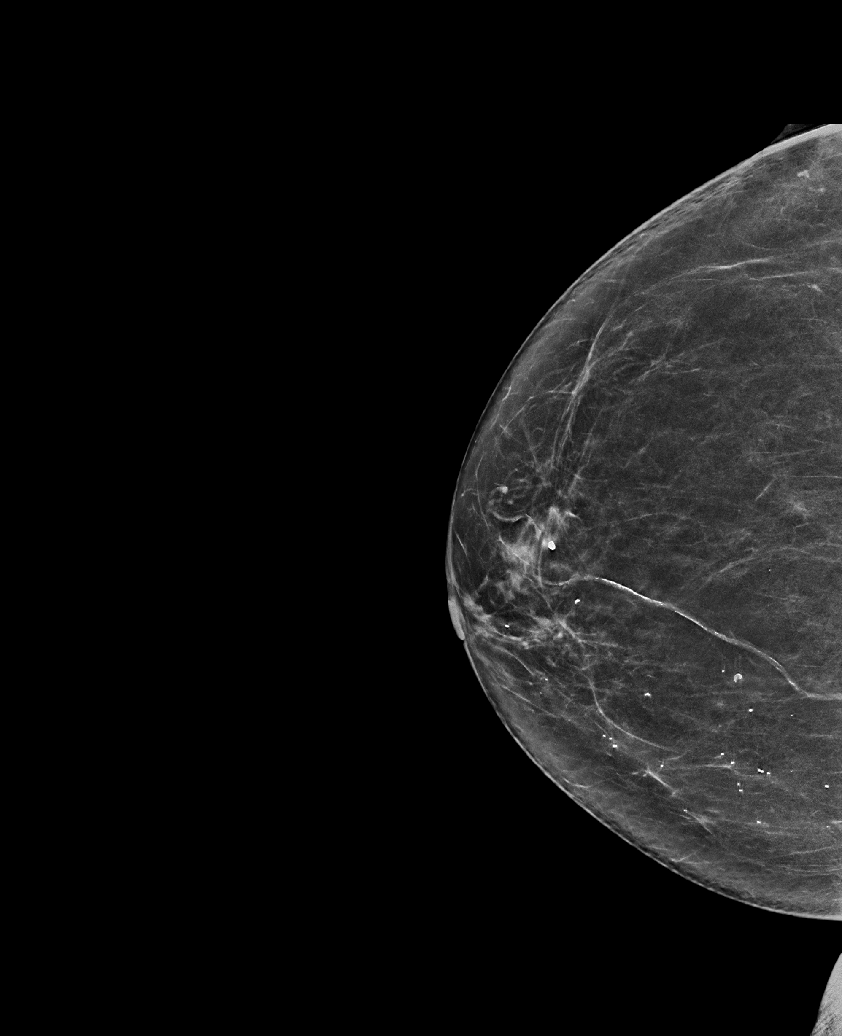

[R MLO synth-2D]
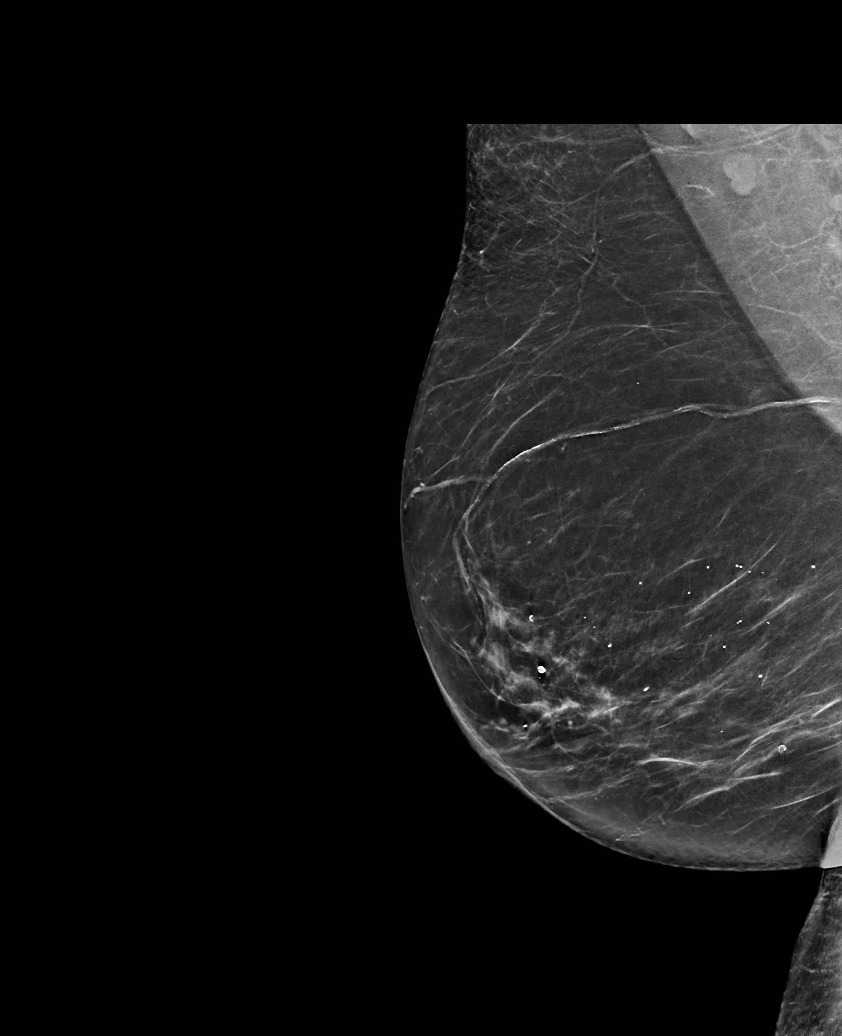

[L MLO synth-2D (1 of 2)]
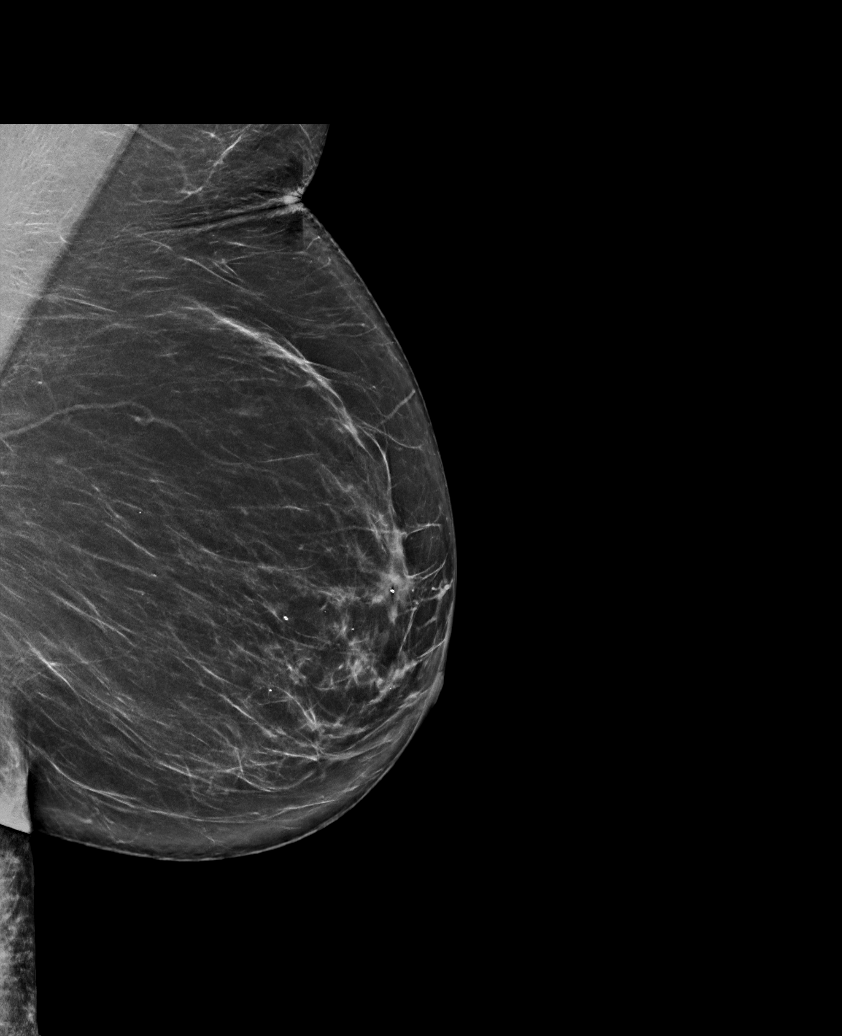

[L CC synth-2D]
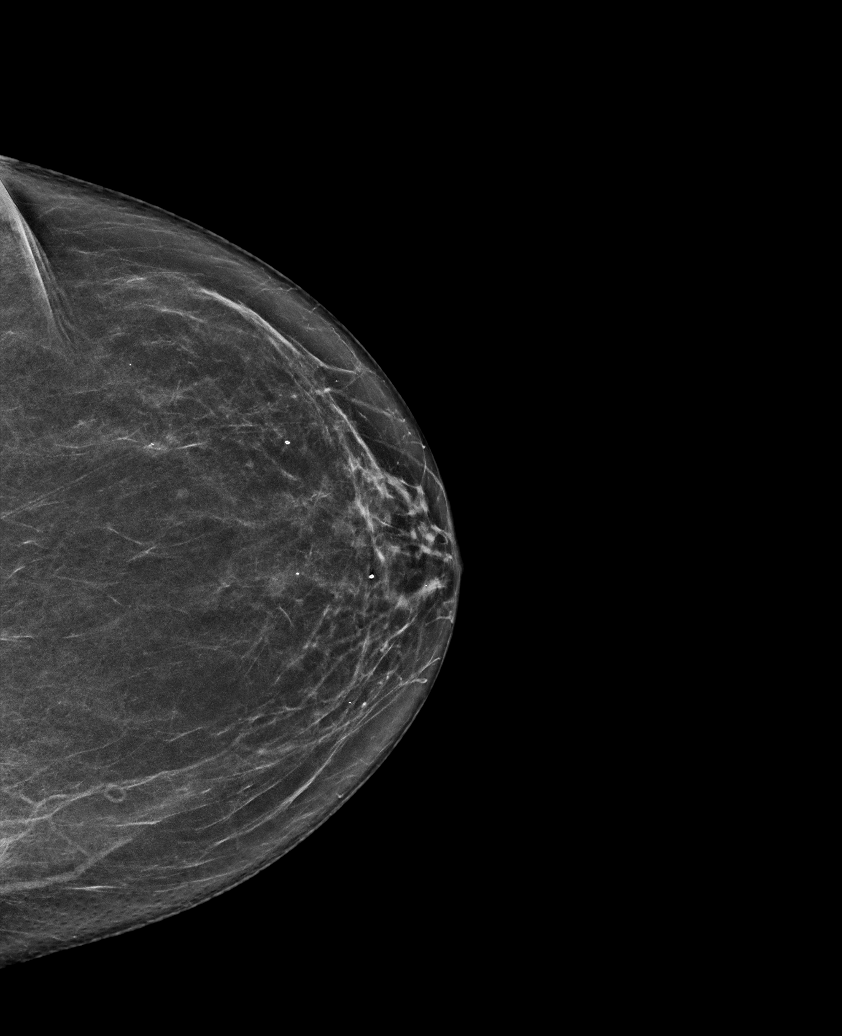

[L MLO synth-2D (2 of 2)]
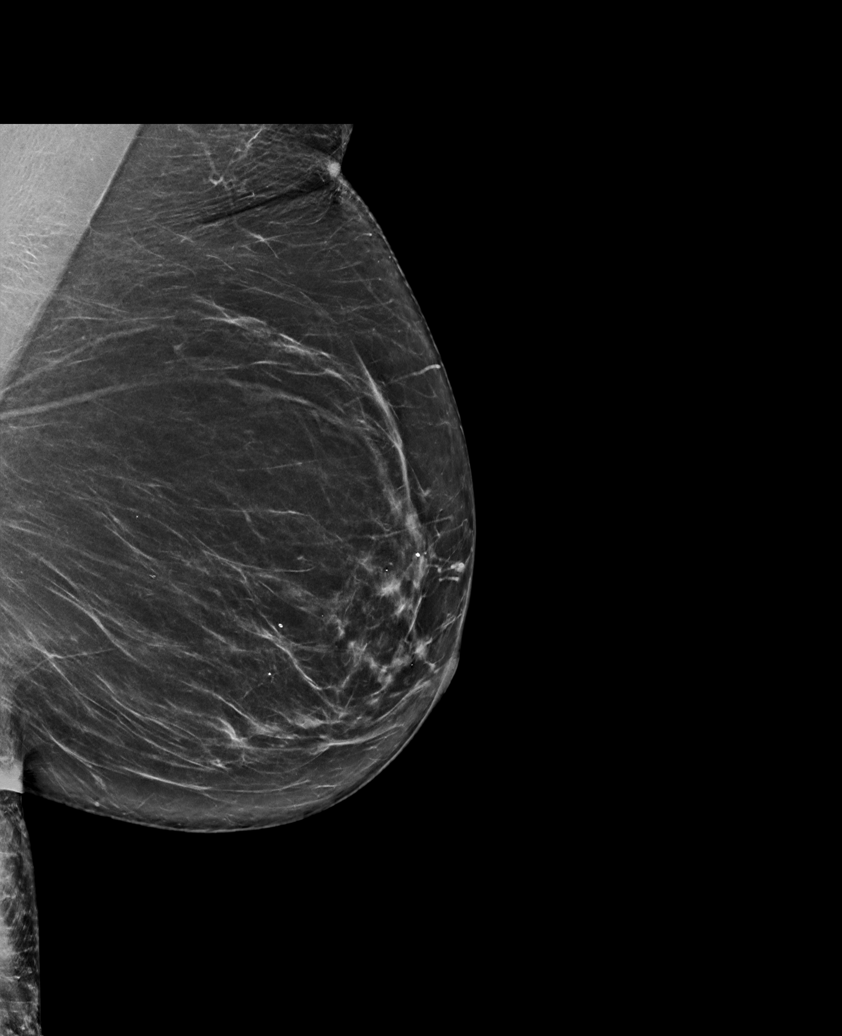

[L MLO tomo · tomo slice 39/78.0]
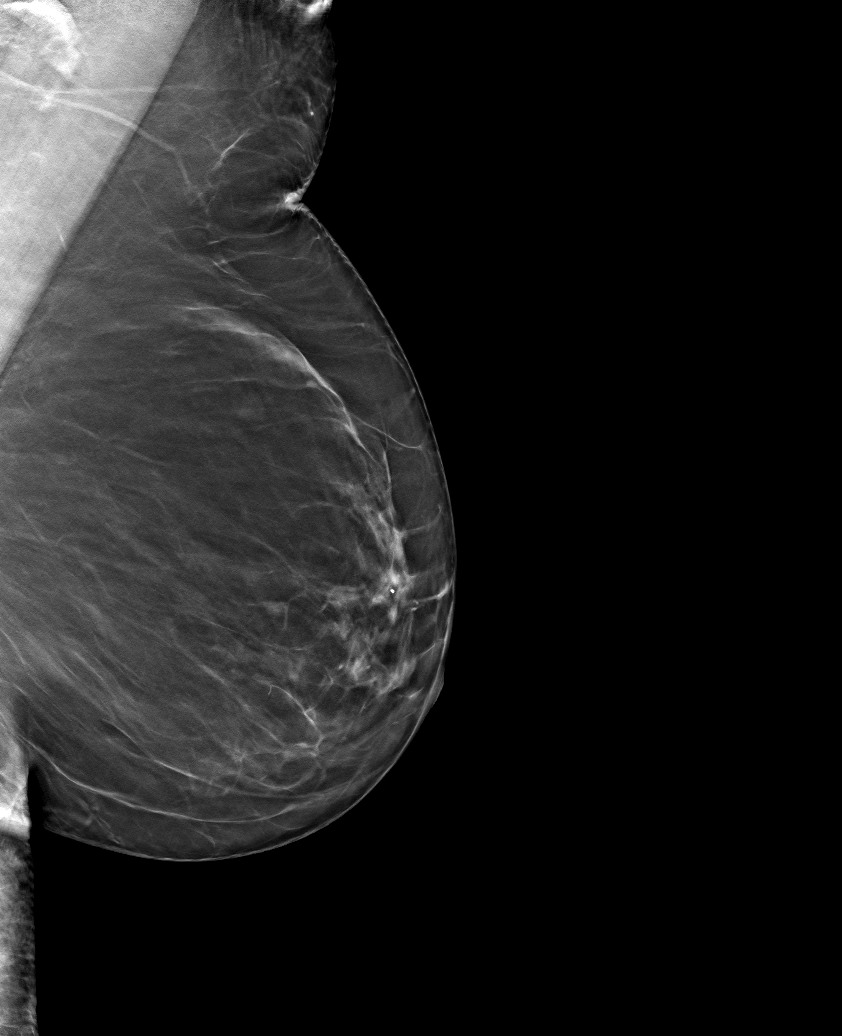

[6 of 30 positions shown; findings below may reference images not displayed]

ACR Breast Density Category b: There are scattered areas of
fibroglandular density.
FINDINGS: There are no findings suspicious for malignancy.
IMPRESSION: No mammographic evidence of malignancy. A result letter of this
screening mammogram will be mailed directly to the patient.

RECOMMENDATION:
Screening mammogram in one year. (Code:51-O-LD2)

BI-RADS CATEGORY  1: Negative.

## 2022-02-20 ENCOUNTER — Other Ambulatory Visit: Payer: Self-pay

## 2022-02-20 ENCOUNTER — Other Ambulatory Visit: Payer: Self-pay | Admitting: Internal Medicine

## 2022-02-20 NOTE — Telephone Encounter (Signed)
Please refill as per office routine med refill policy (all routine meds to be refilled for 3 mo or monthly (per pt preference) up to one year from last visit, then month to month grace period for 3 mo, then further med refills will have to be denied)

## 2022-02-23 ENCOUNTER — Other Ambulatory Visit (INDEPENDENT_AMBULATORY_CARE_PROVIDER_SITE_OTHER): Payer: PPO

## 2022-02-23 DIAGNOSIS — E559 Vitamin D deficiency, unspecified: Secondary | ICD-10-CM

## 2022-02-23 DIAGNOSIS — E1165 Type 2 diabetes mellitus with hyperglycemia: Secondary | ICD-10-CM

## 2022-02-23 LAB — CBC WITH DIFFERENTIAL/PLATELET
Basophils Absolute: 0.1 10*3/uL (ref 0.0–0.1)
Basophils Relative: 1.1 % (ref 0.0–3.0)
Eosinophils Absolute: 0.3 10*3/uL (ref 0.0–0.7)
Eosinophils Relative: 2.9 % (ref 0.0–5.0)
HCT: 41.8 % (ref 36.0–46.0)
Hemoglobin: 14.3 g/dL (ref 12.0–15.0)
Lymphocytes Relative: 19.7 % (ref 12.0–46.0)
Lymphs Abs: 2.2 10*3/uL (ref 0.7–4.0)
MCHC: 34.2 g/dL (ref 30.0–36.0)
MCV: 90.8 fl (ref 78.0–100.0)
Monocytes Absolute: 0.7 10*3/uL (ref 0.1–1.0)
Monocytes Relative: 6.6 % (ref 3.0–12.0)
Neutro Abs: 7.8 10*3/uL — ABNORMAL HIGH (ref 1.4–7.7)
Neutrophils Relative %: 69.7 % (ref 43.0–77.0)
Platelets: 352 10*3/uL (ref 150.0–400.0)
RBC: 4.61 Mil/uL (ref 3.87–5.11)
RDW: 13.2 % (ref 11.5–15.5)
WBC: 11.2 10*3/uL — ABNORMAL HIGH (ref 4.0–10.5)

## 2022-02-23 LAB — HEPATIC FUNCTION PANEL
ALT: 27 U/L (ref 0–35)
AST: 20 U/L (ref 0–37)
Albumin: 4.2 g/dL (ref 3.5–5.2)
Alkaline Phosphatase: 61 U/L (ref 39–117)
Bilirubin, Direct: 0.2 mg/dL (ref 0.0–0.3)
Total Bilirubin: 0.9 mg/dL (ref 0.2–1.2)
Total Protein: 7 g/dL (ref 6.0–8.3)

## 2022-02-23 LAB — BASIC METABOLIC PANEL
BUN: 17 mg/dL (ref 6–23)
CO2: 26 mEq/L (ref 19–32)
Calcium: 8.7 mg/dL (ref 8.4–10.5)
Chloride: 104 mEq/L (ref 96–112)
Creatinine, Ser: 0.97 mg/dL (ref 0.40–1.20)
GFR: 56.08 mL/min — ABNORMAL LOW (ref >60.00)
Glucose, Bld: 172 mg/dL — ABNORMAL HIGH (ref 70–99)
Potassium: 4 mEq/L (ref 3.5–5.1)
Sodium: 142 mEq/L (ref 135–145)

## 2022-02-23 LAB — MICROALBUMIN / CREATININE URINE RATIO
Creatinine,U: 88.2 mg/dL
Microalb Creat Ratio: 3 mg/g (ref 0.0–30.0)
Microalb, Ur: 2.7 mg/dL — ABNORMAL HIGH (ref 0.0–1.9)

## 2022-02-23 LAB — TSH: TSH: 2.46 u[IU]/mL (ref 0.35–5.50)

## 2022-02-23 LAB — LIPID PANEL
Cholesterol: 133 mg/dL (ref 0–200)
HDL: 48.4 mg/dL (ref >39.00)
LDL Cholesterol: 50 mg/dL (ref 0–99)
NonHDL: 84.37
Total CHOL/HDL Ratio: 3
Triglycerides: 173 mg/dL — ABNORMAL HIGH (ref 0.0–149.0)
VLDL: 34.6 mg/dL (ref 0.0–40.0)

## 2022-02-23 LAB — URINALYSIS, ROUTINE W REFLEX MICROSCOPIC
Bilirubin Urine: NEGATIVE
Hgb urine dipstick: NEGATIVE
Ketones, ur: NEGATIVE
Leukocytes,Ua: NEGATIVE
Nitrite: NEGATIVE
Specific Gravity, Urine: 1.01 (ref 1.000–1.030)
Total Protein, Urine: NEGATIVE
Urine Glucose: NEGATIVE
Urobilinogen, UA: 0.2 (ref 0.0–1.0)
pH: 5.5 (ref 5.0–8.0)

## 2022-02-23 LAB — HEMOGLOBIN A1C: Hgb A1c MFr Bld: 8.1 % — ABNORMAL HIGH (ref 4.6–6.5)

## 2022-02-23 LAB — VITAMIN D 25 HYDROXY (VIT D DEFICIENCY, FRACTURES): VITD: 55.99 ng/mL (ref 30.00–100.00)

## 2022-02-27 ENCOUNTER — Ambulatory Visit (INDEPENDENT_AMBULATORY_CARE_PROVIDER_SITE_OTHER): Payer: PPO | Admitting: Internal Medicine

## 2022-02-27 VITALS — BP 122/76 | HR 82 | Temp 98.4°F | Ht 65.0 in | Wt 186.0 lb

## 2022-02-27 DIAGNOSIS — I10 Essential (primary) hypertension: Secondary | ICD-10-CM

## 2022-02-27 DIAGNOSIS — Z0001 Encounter for general adult medical examination with abnormal findings: Secondary | ICD-10-CM | POA: Diagnosis not present

## 2022-02-27 DIAGNOSIS — R9431 Abnormal electrocardiogram [ECG] [EKG]: Secondary | ICD-10-CM | POA: Diagnosis not present

## 2022-02-27 DIAGNOSIS — E1165 Type 2 diabetes mellitus with hyperglycemia: Secondary | ICD-10-CM

## 2022-02-27 DIAGNOSIS — R1013 Epigastric pain: Secondary | ICD-10-CM

## 2022-02-27 DIAGNOSIS — E7800 Pure hypercholesterolemia, unspecified: Secondary | ICD-10-CM | POA: Diagnosis not present

## 2022-02-27 DIAGNOSIS — K5792 Diverticulitis of intestine, part unspecified, without perforation or abscess without bleeding: Secondary | ICD-10-CM | POA: Diagnosis not present

## 2022-02-27 MED ORDER — TIRZEPATIDE 2.5 MG/0.5ML ~~LOC~~ SOAJ: 2.5000 mg | SUBCUTANEOUS | 3 refills | Status: DC

## 2022-02-27 NOTE — Patient Instructions (Addendum)
Please have your Shingrix (shingles) shots done at your local pharmacy.'  We will need to get the Release of Information form for you  Ok to try change of trulicity to [facility] 2.5 mg weekly (and call in 1 month if you wish to increase the dose)  Please continue all other medications as before, and refills have been done if requested.  Please have the pharmacy call with any other refills you may need.  Please continue your efforts at being more active, low cholesterol diet, and weight control.  You are otherwise up to date with prevention measures today.  Please keep your appointments with your specialists as you may have planned  You will be contacted regarding the referral for: Liver RUQ GB ultrasound, Cardiac CT score, and DM education  Please make an Appointment to return in April as planned, or sooner if needed

## 2022-02-27 NOTE — Progress Notes (Unsigned)
Patient ID: CHAZLYN CUDE, female   DOB: Sep 17, 1945, 77 y.o.   MRN: [id number]         Chief Complaint:: wellness exam and recent acute diverticulitis, dehydration, RUQ pain, DM       HPI:  [name] [name] is a 77 y.o. female here for wellness exam; declines covid booster, for shingrix at the pharmacy, o/w up to date                        Also did have episode of LLQ pain and acute diverticulitis seen at ED in [place] 5; advised for inpt but declined, tx with IVF's, oral cipro, flagyl and tramadol, now doing well . Denies worsening reflux, abd pain, dysphagia, n/v, bowel change or blood.  Did also have some unusual RUQ epigastric pain and pt was advised to recommend today that she also have GB ultrasound.  Pt denies chest pain, increased sob or doe, wheezing, orthopnea, PND, increased LE swelling, palpitations, dizziness or syncope.   Pt denies polydipsia, polyuria, or new focal neuro s/s.    Pt denies fever, night sweats, loss of appetite, or other constitutional symptoms, but has lost several lbs with recent illness.  Pt willing for Card CT score, also requests repeat referral to DM education as she really will go this time.     Wt Readings from Last 3 Encounters:  02/27/22 186 lb (84.4 kg)  11/14/21 190 lb (86.2 kg)  05/19/21 192 lb (87.1 kg)   BP Readings from Last 3 Encounters:  02/27/22 122/76  11/14/21 120/78  05/19/21 128/80   Immunization History  Administered Date(s) Administered   Fluad Quad(high Dose 65+) 10/29/2018, 11/04/2019, 11/11/2020, 11/14/2021   Influenza Split 10/16/2011   Influenza Whole 11/02/2008, 12/06/2009   Influenza, High Dose Seasonal PF 11/08/2016, 11/27/2017   Influenza,inj,Quad PF,6+ Mos 10/29/2013   Influenza-Unspecified 11/06/2015   PFIZER(Purple Top)SARS-COV-2 Vaccination 03/09/2019, 03/30/2019, 11/06/2019, 08/18/2020, 01/04/2021   Pneumococcal Conjugate-13 06/25/2013   Pneumococcal Polysaccharide-23 11/02/2008, 10/29/2018   Td 02/05/2005   Td  (Adult), 2 Lf Tetanus Toxid, Preservative Free 02/05/2005   Health Maintenance Due  Topic Date Due   DTaP/Tdap/Td (3 - Tdap) 02/06/2015      Past Medical History:  Diagnosis Date   Cataract    removed both eyes   Diverticulitis    10-06-2019, 11-27-2019   DIVERTICULOSIS, COLON 09/10/2007   DM 03/26/2008   GERD (gastroesophageal reflux disease)    Glaucoma 03/17/2011   HYPERLIPIDEMIA 09/10/2007   HYPERTENSION 09/10/2007   Hypertension    Osteoarthritis 10/21/2011   Shingles 03/17/2011   Past Surgical History:  Procedure Laterality Date   ABDOMINAL HYSTERECTOMY     COLONOSCOPY     HEMATOMA EVACUATION     2003; abdominal GI bleed 2nd-ary to Aspirin consumption   POLYPECTOMY     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 She has never used smokeless tobacco. She reports current alcohol use. She reports that she does not use drugs. family history includes Hypertension in her father and mother. Allergies  Allergen Reactions   Iodinated Contrast  Media Other (See Comments)   Sulfa Antibiotics Hives   Sulfamethoxazole     REACTION: unspecified   Sulfur    Current Outpatient Medications on File Prior to Visit  Medication Sig Dispense Refill   acetaminophen (TYLENOL) 500 MG tablet Take 1,000 mg by mouth every 8 (eight) hours as needed for mild pain or moderate pain.     amLODipine (NORVASC) 5 MG tablet TAKE ONE TABLET BY MOUTH DAILY 90 tablet 3   amoxicillin (AMOXIL) 500 MG capsule TAKE 4 TABLETS BY MOUTH ONE HOUR TO DENTAL PROCEDURE     bimatoprost (LUMIGAN) 0.01 % SOLN Place 1 drop into both eyes  at bedtime.      Continuous Blood Gluc Receiver (FREESTYLE LIBRE 14 DAY READER) DEVI 1 Device by Does not apply route daily as needed. E11.9 1 each 1   Continuous Blood Gluc Sensor (FREESTYLE LIBRE 14 DAY SENSOR) MISC CHANGE EVERY 14 DAYS 6 each 3   Lancet Device MISC Use as directed three times daily E11.9 300 each 3   losartan (COZAAR) 100 MG tablet TAKE ONE TABLET BY MOUTH DAILY 90 tablet 3   metFORMIN (GLUCOPHAGE) 500 MG tablet TAKE TWO TABLETS BY MOUTH TWO TIMES A DAY WITH A MEAL 360 tablet 2   simvastatin (ZOCOR) 40 MG tablet TAKE ONE TABLET BY MOUTH DAILY 90 tablet 3   No current facility-administered medications on file prior to visit.        ROS:  All others reviewed and negative.  Objective        PE:  BP 122/76 (BP Location: Right Arm, Patient Position: Sitting, Cuff Size: Large)   Pulse 82   Temp 98.4 F (36.9 C) (Oral)   Ht '5\' 5"'$  (1.651 m)   Wt 186 lb (84.4 kg)   SpO2 96%   BMI 30.95 kg/m                 Constitutional: Pt appears in NAD               HENT: Head: NCAT.                Right Ear: External ear normal.                 Left Ear: External ear normal.                Eyes: . Pupils are equal, round, and reactive to light. Conjunctivae and EOM are normal               Nose: without d/c or deformity               Neck: Neck supple. Gross normal ROM               Cardiovascular: Normal rate and regular rhythm.                 Pulmonary/Chest: Effort normal and breath sounds without rales or wheezing.                Abd:  Soft, NT, ND, + BS, no organomegaly               Neurological: Pt is alert. At baseline orientation, motor grossly intact               Skin: Skin is warm. No rashes, no other new lesions, LE edema - none               Psychiatric: Pt  behavior is normal without agitation   Micro: none  Cardiac tracings I have personally interpreted today:  none  Pertinent Radiological findings (summarize): none   Lab Results  Component Value Date    WBC 11.2 (H) 02/23/2022   HGB 14.3 02/23/2022   HCT 41.8 02/23/2022   PLT 352.0 02/23/2022   GLUCOSE 172 (H) 02/23/2022   CHOL 133 02/23/2022   TRIG 173.0 (H) 02/23/2022   HDL 48.40 02/23/2022   LDLDIRECT 64.0 05/16/2021   LDLCALC 50 02/23/2022   ALT 27 02/23/2022   AST 20 02/23/2022   NA 142 02/23/2022   K 4.0 02/23/2022   CL 104 02/23/2022   CREATININE 0.97 02/23/2022   BUN 17 02/23/2022   CO2 26 02/23/2022   TSH 2.46 02/23/2022   INR 0.93 03/06/2017   HGBA1C 8.1 (H) 02/23/2022   MICROALBUR 2.7 (H) 02/23/2022   Assessment/Plan:  KEVEN SOUCY is a 77 y.o. White or Caucasian [1] female with  has a past medical history of Cataract, Diverticulitis, DIVERTICULOSIS, COLON (09/10/2007), DM (03/26/2008), GERD (gastroesophageal reflux disease), Glaucoma (03/17/2011), HYPERLIPIDEMIA (09/10/2007), HYPERTENSION (09/10/2007), Hypertension, Osteoarthritis (10/21/2011), and Shingles (03/17/2011).  Encounter for well adult exam with abnormal findings Age and sex appropriate education and counseling updated with regular exercise and diet Referrals for preventative services - none needed Immunizations addressed - declines covid booster Smoking counseling  - none needed Evidence for depression or other mood disorder - none significant Most recent labs reviewed. I have personally reviewed and have noted: 1) the patient's medical and social history 2) The patient's current medications and supplements 3) The patient's height, weight, and BMI have been recorded in the chart   Diabetes mellitus ([place]) Lab Results  Component Value Date   HGBA1C 8.1 (H) 02/23/2022   Uncontrolled, but possibly related to higher sugars overall with recent infectious illness, pt declines any change today except to change trulicity to mounjaro 2.5 mg weekly, will refer to DM education,  pt to continue current medical treatment metformin 500 mg - 2 po bid,   Essential hypertension BP Readings from Last 3  Encounters:  02/27/22 122/76  11/14/21 120/78  05/19/21 128/80   Stable, pt to continue medical treatment norvasc 5 mg qd, losartan 100 mg qd; also for cardiac cT score   Hyperlipidemia Lab Results  Component Value Date   LDLCALC 50 02/23/2022   Stable, pt to continue current statin zocor 40 mg qd   Diverticulitis Clinically resolved,  to f/u any worsening symptoms or concerns, will try get further records from [place]  Epigastric pain Exam benign, for GB u/s per pt request after recent recommendation  Followup: Return if symptoms worsen or fail to improve.  Cathlean Cower, MD 02/28/2022 4:57 AM [facility]

## 2022-02-28 ENCOUNTER — Encounter: Payer: Self-pay | Admitting: Internal Medicine

## 2022-02-28 DIAGNOSIS — R1013 Epigastric pain: Secondary | ICD-10-CM | POA: Insufficient documentation

## 2022-02-28 NOTE — Assessment & Plan Note (Addendum)
Clinically resolved,  to f/u any worsening symptoms or concerns, will try get further records from [place]

## 2022-02-28 NOTE — Assessment & Plan Note (Signed)
Exam benign, for GB u/s per pt request after recent recommendation

## 2022-02-28 NOTE — Assessment & Plan Note (Addendum)
BP Readings from Last 3 Encounters:  02/27/22 122/76  11/14/21 120/78  05/19/21 128/80   Stable, pt to continue medical treatment norvasc 5 mg qd, losartan 100 mg qd; also for cardiac cT score

## 2022-02-28 NOTE — Assessment & Plan Note (Addendum)
Lab Results  Component Value Date   HGBA1C 8.1 (H) 02/23/2022   Uncontrolled, but possibly related to higher sugars overall with recent infectious illness, pt declines any change today except to change trulicity to mounjaro 2.5 mg weekly, will refer to DM education,  pt to continue current medical treatment metformin 500 mg - 2 po bid,

## 2022-02-28 NOTE — Assessment & Plan Note (Signed)

## 2022-02-28 NOTE — Assessment & Plan Note (Signed)
Lab Results  Component Value Date   LDLCALC 50 02/23/2022   Stable, pt to continue current statin zocor 40 mg qd

## 2022-03-08 ENCOUNTER — Encounter: Payer: Self-pay | Admitting: Internal Medicine

## 2022-03-08 NOTE — Telephone Encounter (Signed)
Patient Advocate Encounter   Received notification from [employer] that prior authorization for Darcel Bayley is required.   PA submitted on 03/08/2022 Key [id number] Status is pending

## 2022-03-09 ENCOUNTER — Ambulatory Visit
Admission: RE | Admit: 2022-03-09 | Discharge: 2022-03-09 | Disposition: A | Discharge status: Home / Self Care | Payer: PPO | Source: Ambulatory Visit | Attending: Internal Medicine | Admitting: Internal Medicine

## 2022-03-09 DIAGNOSIS — R1013 Epigastric pain: Secondary | ICD-10-CM

## 2022-03-09 DIAGNOSIS — R109 Unspecified abdominal pain: Secondary | ICD-10-CM | POA: Diagnosis not present

## 2022-03-09 DIAGNOSIS — K760 Fatty (change of) liver, not elsewhere classified: Secondary | ICD-10-CM | POA: Diagnosis not present

## 2022-03-15 NOTE — Telephone Encounter (Signed)
Pt PA is canceled by provider

## 2022-03-16 ENCOUNTER — Encounter: Payer: Self-pay | Admitting: Internal Medicine

## 2022-03-16 NOTE — Telephone Encounter (Signed)
Please clarify is pt should be using trulicity of mounjaro, there is a note stating that a PA was canceled by provider

## 2022-03-16 NOTE — Telephone Encounter (Signed)
Ok to change to the [facility] as we discussed   thanks

## 2022-03-27 ENCOUNTER — Encounter: Payer: Self-pay | Admitting: Obstetrics and Gynecology

## 2022-03-27 ENCOUNTER — Ambulatory Visit (INDEPENDENT_AMBULATORY_CARE_PROVIDER_SITE_OTHER): Payer: PPO | Admitting: Obstetrics and Gynecology

## 2022-03-27 ENCOUNTER — Other Ambulatory Visit (HOSPITAL_COMMUNITY)
Admission: RE | Admit: 2022-03-27 | Discharge: 2022-03-27 | Disposition: A | Discharge status: Home / Self Care | Payer: PPO | Source: Ambulatory Visit | Attending: Obstetrics and Gynecology | Admitting: Obstetrics and Gynecology

## 2022-03-27 VITALS — BP 151/82 | HR 84 | Ht 66.0 in | Wt 186.2 lb

## 2022-03-27 DIAGNOSIS — N898 Other specified noninflammatory disorders of vagina: Secondary | ICD-10-CM | POA: Insufficient documentation

## 2022-03-27 DIAGNOSIS — Z1231 Encounter for screening mammogram for malignant neoplasm of breast: Secondary | ICD-10-CM

## 2022-03-27 DIAGNOSIS — N762 Acute vulvitis: Secondary | ICD-10-CM

## 2022-03-27 MED ORDER — CLOTRIMAZOLE-BETAMETHASONE 1-0.05 % EX CREA: 1.0000 | TOPICAL_CREAM | Freq: Two times a day (BID) | CUTANEOUS | 1 refills | Status: DC

## 2022-03-27 NOTE — Progress Notes (Signed)
[name] [name] presents with c/o vulvar itching for the last several months Sx started after a course of antibiotics for diverticulitis. She also tried a new soap H/O DM, last A1c @ 6 H/O hysterectomy  PE AF VSS Chaperone present Lungs clear Heart RRR Abd soft + BS GU vulvitis noted, redden, raw, skin noted, vaginal swab collected  A/P Vulvitis, vaginal itching        Screening Mammogram  Suspect yeast. Will start Lotrisone. A & D onit.  Mammogram ordered F/U in 4 weeks

## 2022-03-27 NOTE — Progress Notes (Signed)
New pt presents for annual exam. Last mammogram 11/10/20. Denies any abnormal breast changes. Pt reports vaginal itching and discomfort for approx 2 months. Denies any odor or discharge. No other concerns at this time.

## 2022-03-28 ENCOUNTER — Other Ambulatory Visit: Payer: Self-pay | Admitting: Emergency Medicine

## 2022-03-28 LAB — CERVICOVAGINAL ANCILLARY ONLY
Bacterial Vaginitis (gardnerella): NEGATIVE
Candida Glabrata: NEGATIVE
Candida Vaginitis: POSITIVE — AB
Comment: NEGATIVE
Comment: NEGATIVE
Comment: NEGATIVE

## 2022-03-28 MED ORDER — FLUCONAZOLE 150 MG PO TABS: 150.0000 mg | ORAL_TABLET | ORAL | 0 refills | Status: DC

## 2022-03-28 NOTE — Progress Notes (Signed)
Rx for diflucan per protocol.  Override approved by Rip Harbour, MD.

## 2022-03-29 ENCOUNTER — Other Ambulatory Visit: Payer: Self-pay | Admitting: Emergency Medicine

## 2022-04-02 ENCOUNTER — Ambulatory Visit
Admission: RE | Admit: 2022-04-02 | Discharge: 2022-04-02 | Disposition: A | Discharge status: Home / Self Care | Payer: PPO | Source: Ambulatory Visit | Attending: Obstetrics and Gynecology | Admitting: Obstetrics and Gynecology

## 2022-04-02 DIAGNOSIS — Z1231 Encounter for screening mammogram for malignant neoplasm of breast: Secondary | ICD-10-CM

## 2022-04-26 ENCOUNTER — Encounter: Payer: Self-pay | Admitting: Obstetrics and Gynecology

## 2022-04-26 ENCOUNTER — Ambulatory Visit (INDEPENDENT_AMBULATORY_CARE_PROVIDER_SITE_OTHER): Payer: PPO | Admitting: Obstetrics and Gynecology

## 2022-04-26 VITALS — BP 133/80 | HR 76 | Wt 188.0 lb

## 2022-04-26 DIAGNOSIS — N898 Other specified noninflammatory disorders of vagina: Secondary | ICD-10-CM

## 2022-04-26 NOTE — Progress Notes (Signed)
[name] [name] presents for F/U from previous problems visit She reports her Sx have resoled and she has no complaints today  Denies any vaginal itching or discharge  No bowel or bladder dysfunction.  PE AF VSS Lungs clear Herat RRR Abd soft + BS   A/P Vaginal discharge, resolved  F/U PRN

## 2022-04-26 NOTE — Progress Notes (Signed)
Pt states she is doing well, problems have resolved.

## 2022-05-02 ENCOUNTER — Telehealth: Payer: Self-pay

## 2022-05-02 NOTE — Telephone Encounter (Signed)
Contacted [name] [name] to schedule their annual wellness visit. Appointment made for 05/16/22.  Norton Blizzard, [place] ([facility])  [place] [facility] [phone]

## 2022-05-14 ENCOUNTER — Other Ambulatory Visit (INDEPENDENT_AMBULATORY_CARE_PROVIDER_SITE_OTHER): Payer: PPO

## 2022-05-14 DIAGNOSIS — E538 Deficiency of other specified B group vitamins: Secondary | ICD-10-CM | POA: Diagnosis not present

## 2022-05-14 DIAGNOSIS — E559 Vitamin D deficiency, unspecified: Secondary | ICD-10-CM | POA: Diagnosis not present

## 2022-05-14 LAB — VITAMIN D 25 HYDROXY (VIT D DEFICIENCY, FRACTURES): VITD: 67.72 ng/mL (ref 30.00–100.00)

## 2022-05-14 LAB — VITAMIN B12: Vitamin B-12: 1338 pg/mL — ABNORMAL HIGH (ref 211–911)

## 2022-05-15 DIAGNOSIS — H903 Sensorineural hearing loss, bilateral: Secondary | ICD-10-CM | POA: Diagnosis not present

## 2022-05-16 ENCOUNTER — Ambulatory Visit (INDEPENDENT_AMBULATORY_CARE_PROVIDER_SITE_OTHER): Payer: PPO

## 2022-05-16 VITALS — Ht 66.0 in | Wt 188.0 lb

## 2022-05-16 DIAGNOSIS — E119 Type 2 diabetes mellitus without complications: Secondary | ICD-10-CM

## 2022-05-16 DIAGNOSIS — Z780 Asymptomatic menopausal state: Secondary | ICD-10-CM | POA: Diagnosis not present

## 2022-05-16 DIAGNOSIS — Z0000 Encounter for general adult medical examination without abnormal findings: Secondary | ICD-10-CM

## 2022-05-16 NOTE — Progress Notes (Signed)
I connected with  [name] [name] on 05/16/22 by a audio enabled telemedicine application and verified that I am speaking with the correct person using two identifiers.  Patient Location: Home  Provider Location: Office/Clinic  I discussed the limitations of evaluation and management by telemedicine. The patient expressed understanding and agreed to proceed.  Subjective:   [name] [name] is a 77 y.o. female who presents for Medicare Annual (Subsequent) preventive examination.  Review of Systems     Cardiac Risk Factors include: advanced age (>62men, >55 women);diabetes mellitus;hypertension     Objective:    There were no vitals filed for this visit. There is no height or weight on file to calculate BMI.     05/16/2022   10:36 AM 10/05/2019   11:48 PM 10/01/2019    2:28 PM 03/11/2017   10:10 AM 03/06/2017   11:10 AM 09/26/2016   10:42 AM 09/19/2016   10:31 AM  Advanced Directives  Does Patient Have a Medical Advance Directive? No No No No No No No  Would patient like information on creating a medical advance directive? No - Patient declined No - Patient declined No - Patient declined No - Patient declined No - Patient declined No - Patient declined No - Patient declined    Current Medications (verified) Outpatient Encounter Medications as of 05/16/2022  Medication Sig   acetaminophen (TYLENOL) 500 MG tablet Take 1,000 mg by mouth every 8 (eight) hours as needed for mild pain or moderate pain.   amLODipine (NORVASC) 5 MG tablet TAKE ONE TABLET BY MOUTH DAILY   bimatoprost (LUMIGAN) 0.01 % SOLN Place 1 drop into both eyes at bedtime.    Continuous Blood Gluc Receiver (FREESTYLE LIBRE 14 DAY READER) DEVI 1 Device by Does not apply route daily as needed. E11.9   Continuous Blood Gluc Sensor (FREESTYLE LIBRE 14 DAY SENSOR) MISC CHANGE EVERY 14 DAYS   Lancet Device MISC Use as directed three times daily E11.9   losartan (COZAAR) 100 MG tablet TAKE ONE TABLET BY MOUTH DAILY   metFORMIN  (GLUCOPHAGE) 500 MG tablet TAKE TWO TABLETS BY MOUTH TWO TIMES A DAY WITH A MEAL   simvastatin (ZOCOR) 40 MG tablet TAKE ONE TABLET BY MOUTH DAILY   TRULICITY 3 MG/0.5ML SOPN Inject into the skin.   No facility-administered encounter medications on file as of 05/16/2022.    Allergies (verified) Iodinated contrast media, Sulfa antibiotics, Sulfamethoxazole, and Sulfur   History: Past Medical History:  Diagnosis Date   Cataract    removed both eyes   Diverticulitis    10-06-2019, 11-27-2019   DIVERTICULOSIS, COLON 09/10/2007   DM 03/26/2008   GERD (gastroesophageal reflux disease)    Glaucoma 03/17/2011   HYPERLIPIDEMIA 09/10/2007   HYPERTENSION 09/10/2007   Hypertension    Osteoarthritis 10/21/2011   Shingles 03/17/2011   Past Surgical History:  Procedure Laterality Date   ABDOMINAL HYSTERECTOMY     COLONOSCOPY     HEMATOMA EVACUATION     2003; abdominal GI bleed 2nd-ary to Aspirin consumption   POLYPECTOMY     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   Procedure: LEFT TOTAL KNEE ARTHROPLASTY;  Surgeon: Ollen Gross, MD;  Location: [facility];  Service: Orthopedics;  Laterality: Left;  with canal block   TOTAL KNEE ARTHROPLASTY Right 03/11/2017   Procedure: RIGHT TOTAL KNEE ARTHROPLASTY;  Surgeon: Ollen Gross, MD;  Location: [facility];  Service: Orthopedics;  Laterality: Right;   TUBAL LIGATION     Family History  Problem Relation Age of  Onset   Hypertension Mother    Hypertension Father    Colon cancer Neg Hx    Esophageal cancer Neg Hx    Colon polyps Neg Hx    Rectal cancer Neg Hx    Stomach cancer Neg Hx    Social History   Socioeconomic History   Marital status: Married    Spouse name: Not on file   Number of children: Not on file   Years of education: Not on file   Highest education level: Not on file  Occupational History   Not on file  Tobacco Use   Smoking status: Never   Smokeless tobacco: Never  Vaping Use   Vaping Use: Never used  Substance and  Sexual Activity   Alcohol use: Yes    Comment: occ: social   Drug use: No   Sexual activity: Not Currently  Other Topics Concern   Not on file  Social History Narrative   Not on file   Social Determinants of Health   Financial Resource Strain: Low Risk  (05/16/2022)   Overall Financial Resource Strain (CARDIA)    Difficulty of Paying Living Expenses: Not hard at all  Food Insecurity: No Food Insecurity (05/16/2022)   Hunger Vital Sign    Worried About Running Out of Food in the Last Year: Never true    Ran Out of Food in the Last Year: Never true  Transportation Needs: No Transportation Needs (05/16/2022)   PRAPARE - Administrator, Civil Service (Medical): No    Lack of Transportation (Non-Medical): No  Physical Activity: Insufficiently Active (05/16/2022)   Exercise Vital Sign    Days of Exercise per Week: 2 days    Minutes of Exercise per Session: 40 min  Stress: No Stress Concern Present (05/16/2022)   [employer] of Occupational Health - Occupational Stress Questionnaire    Feeling of Stress : Not at all  Social Connections: Moderately Integrated (05/16/2022)   Social Connection and Isolation Panel [NHANES]    Frequency of Communication with Friends and Family: More than three times a week    Frequency of Social Gatherings with Friends and Family: Once a week    Attends Religious Services: More than 4 times per year    Active Member of [employer] or Organizations: No    Attends Engineer, structural: Never    Marital Status: Married    Tobacco Counseling Counseling given: Not Answered   Clinical Intake:  Pre-visit preparation completed: Yes  Pain : No/denies pain     Nutritional Risks: None Diabetes: Yes CBG done?: No Did pt. bring in CBG monitor from home?: No  How often do you need to have someone help you when you read instructions, pamphlets, or other written materials from your doctor or pharmacy?: 1 - Never  Diabetic?yes Nutrition  Risk Assessment:  Has the patient had any N/V/D within the last 2 months?  No  Does the patient have any non-healing wounds?  No  Has the patient had any unintentional weight loss or weight gain?  No   Diabetes:  Is the patient diabetic?  Yes  If diabetic, was a CBG obtained today?  No  Did the patient bring in their glucometer from home?  No  How often do you monitor your CBG's? continuous.   Financial Strains and Diabetes Management:  Are you having any financial strains with the device, your supplies or your medication? No .  Does the patient want to be seen by Chronic  Care Management for management of their diabetes?  No  Would the patient like to be referred to a Nutritionist or for Diabetic Management?  No   Diabetic Exams:  Diabetic Eye Exam: Completed 02/21/22. . Pt has been advised about the importance in completing this exam.   Diabetic Foot Exam: Completed 02/27/22. Pt has been advised about the importance in completing this exam.   Interpreter Needed?: No  Information entered by :: Kennedy BuckerLorrie Khyran Riera, LPN   Activities of Daily Living    05/16/2022   10:37 AM 05/14/2022    2:27 PM  In your present state of health, do you have any difficulty performing the following activities:  Hearing? 0 0  Vision? 0 0  Difficulty concentrating or making decisions? 0 0  Walking or climbing stairs? 0 0  Dressing or bathing? 0 0  Doing errands, shopping? 0 0  Preparing Food and eating ? N N  Using the Toilet? N N  In the past six months, have you accidently leaked urine? N N  Do you have problems with loss of bowel control? N N  Managing your Medications? N N  Managing your Finances? N N  Housekeeping or managing your Housekeeping? N N    Patient Care Team: Corwin LevinsJohn, James W, MD as PCP - General  Indicate any recent Medical Services you may have received from other than [facility] providers in the past year (date may be approximate).     Assessment:   This is a routine wellness  examination for Johnny BridgeMartha.  Hearing/Vision screen Hearing Screening - Comments:: No aids, going to get them Vision Screening - Comments:: Wears glasses- Dr.Scott  Dietary issues and exercise activities discussed: Current Exercise Habits: Home exercise routine, Type of exercise: walking, Time (Minutes): 45, Frequency (Times/Week): 2, Weekly Exercise (Minutes/Week): 90, Intensity: Mild   Goals Addressed             This Visit's Progress    DIET - EAT MORE FRUITS AND VEGETABLES         Depression Screen    05/16/2022   10:34 AM 02/27/2022    2:09 PM 02/27/2022    1:48 PM 11/14/2021   10:36 AM 05/19/2021   10:59 AM 05/19/2021   10:24 AM 11/11/2020   10:01 AM  PHQ 2/9 Scores  PHQ - 2 Score 0 0 0 0 0 0 0  PHQ- 9 Score 0  0 2       Fall Risk    05/16/2022   10:37 AM 05/14/2022    2:27 PM 02/27/2022    2:09 PM 02/27/2022    1:48 PM 11/14/2021   10:36 AM  Fall Risk   Falls in the past year? 0 0 0 0 0  Number falls in past yr: 0 0 0 0 0  Injury with Fall? 0 0 0  0  Risk for fall due to : No Fall Risks   No Fall Risks No Fall Risks  Follow up Falls prevention discussed;Falls evaluation completed   Falls evaluation completed Falls evaluation completed    FALL RISK PREVENTION PERTAINING TO THE HOME:  Any stairs in or around the home? No  If so, are there any without handrails? No  Home free of loose throw rugs in walkways, pet beds, electrical cords, etc? Yes  Adequate lighting in your home to reduce risk of falls? Yes   ASSISTIVE DEVICES UTILIZED TO PREVENT FALLS:  Life alert? No  Use of a cane, walker or w/c? No  Grab  bars in the bathroom? Yes  Shower chair or bench in shower? No  Elevated toilet seat or a handicapped toilet? Yes    Cognitive Function:        05/16/2022   10:42 AM  6CIT Screen  What Year? 0 points  What month? 0 points  What time? 0 points  Count back from 20 0 points  Months in reverse 0 points  Repeat phrase 0 points  Total Score 0 points     Immunizations Immunization History  Administered Date(s) Administered   Fluad Quad(high Dose 65+) 10/29/2018, 11/04/2019, 11/11/2020, 11/14/2021   Influenza Split 10/16/2011   Influenza Whole 11/02/2008, 12/06/2009   Influenza, High Dose Seasonal PF 11/08/2016, 11/27/2017   Influenza,inj,Quad PF,6+ Mos 10/29/2013   Influenza-Unspecified 11/06/2015   PFIZER(Purple Top)SARS-COV-2 Vaccination 03/09/2019, 03/30/2019, 11/06/2019, 08/18/2020, 01/04/2021   Pneumococcal Conjugate-13 06/25/2013   Pneumococcal Polysaccharide-23 11/02/2008, 10/29/2018   Td 02/05/2005   Td (Adult), 2 Lf Tetanus Toxid, Preservative Free 02/05/2005    TDAP status: Due, Education has been provided regarding the importance of this vaccine. Advised may receive this vaccine at local pharmacy or Health Dept. Aware to provide a copy of the vaccination record if obtained from local pharmacy or Health Dept. Verbalized acceptance and understanding.  Flu Vaccine status: Up to date  Pneumococcal vaccine status: Up to date  Covid-19 vaccine status: Completed vaccines  Qualifies for Shingles Vaccine? Yes   Zostavax completed No   Shingrix Completed?: No.    Education has been provided regarding the importance of this vaccine. Patient has been advised to call insurance company to determine out of pocket expense if they have not yet received this vaccine. Advised may also receive vaccine at local pharmacy or Health Dept. Verbalized acceptance and understanding.  Screening Tests Health Maintenance  Topic Date Due   DTaP/Tdap/Td (3 - Tdap) 02/06/2015   COVID-19 Vaccine (6 - 2023-24 season) 10/06/2021   Zoster Vaccines- Shingrix (1 of 2) 05/29/2022 (Originally 11/29/1964)   HEMOGLOBIN A1C  08/24/2022   INFLUENZA VACCINE  09/06/2022   OPHTHALMOLOGY EXAM  02/22/2023   Diabetic kidney evaluation - eGFR measurement  02/24/2023   Diabetic kidney evaluation - Urine ACR  02/24/2023   FOOT EXAM  02/28/2023   Medicare Annual  Wellness (AWV)  05/16/2023   Pneumonia Vaccine 1+ Years old  Completed   DEXA SCAN  Completed   Hepatitis C Screening  Completed   HPV VACCINES  Aged Out   COLONOSCOPY (Pts 45-67yrs Insurance coverage will need to be confirmed)  Discontinued    Health Maintenance  Health Maintenance Due  Topic Date Due   DTaP/Tdap/Td (3 - Tdap) 02/06/2015   COVID-19 Vaccine (6 - 2023-24 season) 10/06/2021    Colorectal cancer screening: No longer required.   Mammogram status: No longer required due to age.  Bone Density status: Completed 07/06/15. Results reflect: Bone density results: NORMAL. Repeat every 5 years.  Lung Cancer Screening: (Low Dose CT Chest recommended if Age 35-80 years, 30 pack-year currently smoking OR have quit w/in 15years.) does not qualify.   Additional Screening:  Hepatitis C Screening: does qualify; Completed 02/13/16  Vision Screening: Recommended annual ophthalmology exams for early detection of glaucoma and other disorders of the eye. Is the patient up to date with their annual eye exam?  Yes  Who is the provider or what is the name of the office in which the patient attends annual eye exams? Dr.Scott If pt is not established with a provider, would they like to be  referred to a provider to establish care? No .   Dental Screening: Recommended annual dental exams for proper oral hygiene  Community Resource Referral / Chronic Care Management: CRR required this visit?  No   CCM required this visit?  No      Plan:     I have personally reviewed and noted the following in the patient's chart:   Medical and social history Use of alcohol, tobacco or illicit drugs  Current medications and supplements including opioid prescriptions. Patient is not currently taking opioid prescriptions. Functional ability and status Nutritional status Physical activity Advanced directives List of other physicians Hospitalizations, surgeries, and ER visits in previous 12  months Vitals Screenings to include cognitive, depression, and falls Referrals and appointments  In addition, I have reviewed and discussed with patient certain preventive protocols, quality metrics, and best practice recommendations. A written personalized care plan for preventive services as well as general preventive health recommendations were provided to patient.     Hal Hope, LPN   [id number]/[id number]   Nurse Notes: none

## 2022-05-16 NOTE — Patient Instructions (Signed)
[name] [name] , Thank you for taking time to come for your Medicare Wellness Visit. I appreciate your ongoing commitment to your health goals. Please review the following plan we discussed and let me know if I can assist you in the future.   These are the goals we discussed:  Goals      DIET - EAT MORE FRUITS AND VEGETABLES        This is a list of the screening recommended for you and due dates:  Health Maintenance  Topic Date Due   DTaP/Tdap/Td vaccine (3 - Tdap) 02/06/2015   COVID-19 Vaccine (6 - 2023-24 season) 10/06/2021   Zoster (Shingles) Vaccine (1 of 2) 05/29/2022*   Hemoglobin A1C  08/24/2022   Flu Shot  09/06/2022   Eye exam for diabetics  02/22/2023   Yearly kidney function blood test for diabetes  02/24/2023   Yearly kidney health urinalysis for diabetes  02/24/2023   Complete foot exam   02/28/2023   Medicare Annual Wellness Visit  05/16/2023   Pneumonia Vaccine  Completed   DEXA scan (bone density measurement)  Completed   Hepatitis C Screening: USPSTF Recommendation to screen - Ages 25-79 yo.  Completed   HPV Vaccine  Aged Out   Colon Cancer Screening  Discontinued  *Topic was postponed. The date shown is not the original due date.    Advanced directives: no  Conditions/risks identified: none  Next appointment: Follow up in one year for your annual wellness visit 05/17/23 @ 8:00 am by phone   Preventive Care 65 Years and Older, Female Preventive care refers to lifestyle choices and visits with your health care provider that can promote health and wellness. What does preventive care include? A yearly physical exam. This is also called an annual well check. Dental exams once or twice a year. Routine eye exams. Ask your health care provider how often you should have your eyes checked. Personal lifestyle choices, including: Daily care of your teeth and gums. Regular physical activity. Eating a healthy diet. Avoiding tobacco and drug use. Limiting alcohol  use. Practicing safe sex. Taking low-dose aspirin every day. Taking vitamin and mineral supplements as recommended by your health care provider. What happens during an annual well check? The services and screenings done by your health care provider during your annual well check will depend on your age, overall health, lifestyle risk factors, and family history of disease. Counseling  Your health care provider may ask you questions about your: Alcohol use. Tobacco use. Drug use. Emotional well-being. Home and relationship well-being. Sexual activity. Eating habits. History of falls. Memory and ability to understand (cognition). Work and work Astronomer. Reproductive health. Screening  You may have the following tests or measurements: Height, weight, and BMI. Blood pressure. Lipid and cholesterol levels. These may be checked every 5 years, or more frequently if you are over 69 years old. Skin check. Lung cancer screening. You may have this screening every year starting at age 29 if you have a 30-pack-year history of smoking and currently smoke or have quit within the past 15 years. Fecal occult blood test (FOBT) of the stool. You may have this test every year starting at age 70. Flexible sigmoidoscopy or colonoscopy. You may have a sigmoidoscopy every 5 years or a colonoscopy every 10 years starting at age 73. Hepatitis C blood test. Hepatitis B blood test. Sexually transmitted disease (STD) testing. Diabetes screening. This is done by checking your blood sugar (glucose) after you have not eaten for a  while (fasting). You may have this done every 1-3 years. Bone density scan. This is done to screen for osteoporosis. You may have this done starting at age 48. Mammogram. This may be done every 1-2 years. Talk to your health care provider about how often you should have regular mammograms. Talk with your health care provider about your test results, treatment options, and if necessary,  the need for more tests. Vaccines  Your health care provider may recommend certain vaccines, such as: Influenza vaccine. This is recommended every year. Tetanus, diphtheria, and acellular pertussis (Tdap, Td) vaccine. You may need a Td booster every 10 years. Zoster vaccine. You may need this after age 72. Pneumococcal 13-valent conjugate (PCV13) vaccine. One dose is recommended after age 75. Pneumococcal polysaccharide (PPSV23) vaccine. One dose is recommended after age 49. Talk to your health care provider about which screenings and vaccines you need and how often you need them. This information is not intended to replace advice given to you by your health care provider. Make sure you discuss any questions you have with your health care provider. Document Released: 02/18/2015 Document Revised: 10/12/2015 Document Reviewed: 11/23/2014 Elsevier Interactive Patient Education  2017 [place] Prevention in the Home Falls can cause injuries. They can happen to people of all ages. There are many things you can do to make your home safe and to help prevent falls. What can I do on the outside of my home? Regularly fix the edges of walkways and driveways and fix any cracks. Remove anything that might make you trip as you walk through a door, such as a raised step or threshold. Trim any bushes or trees on the path to your home. Use bright outdoor lighting. Clear any walking paths of anything that might make someone trip, such as rocks or tools. Regularly check to see if handrails are loose or broken. Make sure that both sides of any steps have handrails. Any raised decks and porches should have guardrails on the edges. Have any leaves, snow, or ice cleared regularly. Use sand or salt on walking paths during winter. Clean up any spills in your garage right away. This includes oil or grease spills. What can I do in the bathroom? Use night lights. Install grab bars by the toilet and in the  tub and shower. Do not use towel bars as grab bars. Use non-skid mats or decals in the tub or shower. If you need to sit down in the shower, use a plastic, non-slip stool. Keep the floor dry. Clean up any water that spills on the floor as soon as it happens. Remove soap buildup in the tub or shower regularly. Attach bath mats securely with double-sided non-slip rug tape. Do not have throw rugs and other things on the floor that can make you trip. What can I do in the bedroom? Use night lights. Make sure that you have a light by your bed that is easy to reach. Do not use any sheets or blankets that are too big for your bed. They should not hang down onto the floor. Have a firm chair that has side arms. You can use this for support while you get dressed. Do not have throw rugs and other things on the floor that can make you trip. What can I do in the kitchen? Clean up any spills right away. Avoid walking on wet floors. Keep items that you use a lot in easy-to-reach places. If you need to reach something above you,  use a strong step stool that has a grab bar. Keep electrical cords out of the way. Do not use floor polish or wax that makes floors slippery. If you must use wax, use non-skid floor wax. Do not have throw rugs and other things on the floor that can make you trip. What can I do with my stairs? Do not leave any items on the stairs. Make sure that there are handrails on both sides of the stairs and use them. Fix handrails that are broken or loose. Make sure that handrails are as long as the stairways. Check any carpeting to make sure that it is firmly attached to the stairs. Fix any carpet that is loose or worn. Avoid having throw rugs at the top or bottom of the stairs. If you do have throw rugs, attach them to the floor with carpet tape. Make sure that you have a light switch at the top of the stairs and the bottom of the stairs. If you do not have them, ask someone to add them for  you. What else can I do to help prevent falls? Wear shoes that: Do not have high heels. Have rubber bottoms. Are comfortable and fit you well. Are closed at the toe. Do not wear sandals. If you use a stepladder: Make sure that it is fully opened. Do not climb a closed stepladder. Make sure that both sides of the stepladder are locked into place. Ask someone to hold it for you, if possible. Clearly mark and make sure that you can see: Any grab bars or handrails. First and last steps. Where the edge of each step is. Use tools that help you move around (mobility aids) if they are needed. These include: Canes. Walkers. Scooters. Crutches. Turn on the lights when you go into a dark area. Replace any light bulbs as soon as they burn out. Set up your furniture so you have a clear path. Avoid moving your furniture around. If any of your floors are uneven, fix them. If there are any pets around you, be aware of where they are. Review your medicines with your doctor. Some medicines can make you feel dizzy. This can increase your chance of falling. Ask your doctor what other things that you can do to help prevent falls. This information is not intended to replace advice given to you by your health care provider. Make sure you discuss any questions you have with your health care provider. Document Released: 11/18/2008 Document Revised: 06/30/2015 Document Reviewed: 02/26/2014 Elsevier Interactive Patient Education  2017 [employer].

## 2022-05-17 ENCOUNTER — Other Ambulatory Visit: Payer: Self-pay | Admitting: Internal Medicine

## 2022-05-18 ENCOUNTER — Ambulatory Visit: Payer: HMO | Admitting: Internal Medicine

## 2022-05-18 ENCOUNTER — Other Ambulatory Visit: Payer: Self-pay | Admitting: Internal Medicine

## 2022-05-22 ENCOUNTER — Encounter: Payer: Self-pay | Admitting: Internal Medicine

## 2022-05-22 ENCOUNTER — Ambulatory Visit (INDEPENDENT_AMBULATORY_CARE_PROVIDER_SITE_OTHER): Payer: PPO | Admitting: Internal Medicine

## 2022-05-22 VITALS — BP 122/78 | HR 73 | Temp 98.1°F | Ht 66.0 in | Wt 186.0 lb

## 2022-05-22 DIAGNOSIS — K760 Fatty (change of) liver, not elsewhere classified: Secondary | ICD-10-CM | POA: Diagnosis not present

## 2022-05-22 DIAGNOSIS — E7800 Pure hypercholesterolemia, unspecified: Secondary | ICD-10-CM | POA: Diagnosis not present

## 2022-05-22 DIAGNOSIS — I10 Essential (primary) hypertension: Secondary | ICD-10-CM

## 2022-05-22 DIAGNOSIS — R9431 Abnormal electrocardiogram [ECG] [EKG]: Secondary | ICD-10-CM

## 2022-05-22 DIAGNOSIS — E119 Type 2 diabetes mellitus without complications: Secondary | ICD-10-CM | POA: Diagnosis not present

## 2022-05-22 NOTE — Progress Notes (Unsigned)
Patient ID: [name] [name], female   DOB: Nov 21, 1945, 77 y.o.   MRN: [id number]        Chief Complaint: follow up HTN, HLD and hyperglycemia ***       HPI:  [name] [name] is a 77 y.o. female here with c/o         Unable to change the trulicity to [place] due to insurance.  Has appt for DM education.  Had fatty liver only with recent u/sl   H  Wt Readings from Last 3 Encounters:  05/22/22 186 lb (84.4 kg)  05/16/22 188 lb (85.3 kg)  04/26/22 188 lb (85.3 kg)   BP Readings from Last 3 Encounters:  05/22/22 122/78  04/26/22 133/80  03/27/22 (!) 151/82         Past Medical History:  Diagnosis Date   Cataract    removed both eyes   Diverticulitis    10-06-2019, 11-27-2019   DIVERTICULOSIS, COLON 09/10/2007   DM 03/26/2008   GERD (gastroesophageal reflux disease)    Glaucoma 03/17/2011   HYPERLIPIDEMIA 09/10/2007   HYPERTENSION 09/10/2007   Hypertension    Osteoarthritis 10/21/2011   Shingles 03/17/2011   Past Surgical History:  Procedure Laterality Date   ABDOMINAL HYSTERECTOMY     COLONOSCOPY     HEMATOMA EVACUATION     2003; abdominal GI bleed 2nd-ary to Aspirin consumption   POLYPECTOMY     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 She has never used smokeless tobacco. She reports current alcohol use. She reports that she does not use drugs. family history includes Hypertension in her father and mother. Allergies  Allergen Reactions   Iodinated Contrast Media Other (See Comments)   Sulfa Antibiotics Hives   Sulfamethoxazole     REACTION: unspecified   Sulfur    Current Outpatient  Medications on File Prior to Visit  Medication Sig Dispense Refill   acetaminophen (TYLENOL) 500 MG tablet Take 1,000 mg by mouth every 8 (eight) hours as needed for mild pain or moderate pain.     amLODipine (NORVASC) 5 MG tablet TAKE ONE TABLET BY MOUTH DAILY 90 tablet 3   bimatoprost (LUMIGAN) 0.01 % SOLN Place 1 drop into both eyes at bedtime.      Continuous Blood Gluc Receiver (FREESTYLE LIBRE 14 DAY READER) DEVI 1 Device by Does not apply route daily as needed. E11.9 1 each 1   Continuous Blood Gluc Sensor (FREESTYLE LIBRE 14 DAY SENSOR) MISC CHANGE EVERY 14 DAYS 6 each 3   Lancet Device MISC Use as directed three times daily E11.9 300 each 3   losartan (COZAAR) 100 MG tablet TAKE ONE TABLET BY MOUTH DAILY 90 tablet 3   metFORMIN (GLUCOPHAGE) 500 MG tablet TAKE TWO TABLETS BY MOUTH TWO TIMES A DAY WITH A MEAL 360 tablet 2   simvastatin (ZOCOR) 40 MG tablet TAKE ONE TABLET BY MOUTH DAILY 90 tablet 3   TRULICITY 3 MG/0.5ML SOPN Inject into the skin.     No current facility-administered medications on file prior to visit.        ROS:  All others  reviewed and negative.  Objective        PE:  BP 122/78 (BP Location: Left Arm, Patient Position: Sitting, Cuff Size: Normal)   Pulse 73   Temp 98.1 F (36.7 C) (Oral)   Ht  (1.676 m)   Wt 186 lb (84.4 kg)   SpO2 99%   BMI 30.02 kg/m                 Constitutional: Pt appears in NAD               HENT: Head: NCAT.                Right Ear: External ear normal.                 Left Ear: External ear normal.                Eyes: . Pupils are equal, round, and reactive to light. Conjunctivae and EOM are normal               Nose: without d/c or deformity               Neck: Neck supple. Gross normal ROM               Cardiovascular: Normal rate and regular rhythm.                 Pulmonary/Chest: Effort normal and breath sounds without rales or wheezing.                Abd:  Soft, NT, ND, + BS, no organomegaly                Neurological: Pt is alert. At baseline orientation, motor grossly intact               Skin: Skin is warm. No rashes, no other new lesions, LE edema - ***               Psychiatric: Pt behavior is normal without agitation   Micro: none  Cardiac tracings I have personally interpreted today:  none  Pertinent Radiological findings (summarize): none   Lab Results  Component Value Date   WBC 11.2 (H) 02/23/2022   HGB 14.3 02/23/2022   HCT 41.8 02/23/2022   PLT 352.0 02/23/2022   GLUCOSE 172 (H) 02/23/2022   CHOL 133 02/23/2022   TRIG 173.0 (H) 02/23/2022   HDL 48.40 02/23/2022   LDLDIRECT 64.0 05/16/2021   LDLCALC 50 02/23/2022   ALT 27 02/23/2022   AST 20 02/23/2022   NA 142 02/23/2022   K 4.0 02/23/2022   CL 104 02/23/2022   CREATININE 0.97 02/23/2022   BUN 17 02/23/2022   CO2 26 02/23/2022   TSH 2.46 02/23/2022   INR 0.93 03/06/2017   HGBA1C 8.1 (H) 02/23/2022   MICROALBUR 2.7 (H) 02/23/2022   Assessment/Plan:  [name] [name] is a 77 y.o. White or Caucasian [1] female with  has a past medical history of Cataract, Diverticulitis, DIVERTICULOSIS, COLON (09/10/2007), DM (03/26/2008), GERD (gastroesophageal reflux disease), Glaucoma (03/17/2011), HYPERLIPIDEMIA (09/10/2007), HYPERTENSION (09/10/2007), Hypertension, Osteoarthritis (10/21/2011), and Shingles (03/17/2011).  No problem-specific Assessment & Plan notes found for this encounter.  Followup: No follow-ups on file.  Oliver Barre, MD 05/22/2022 1:42 PM  [facility] - [facility]

## 2022-05-22 NOTE — Patient Instructions (Addendum)
Ok to continue the trulicity  Please continue all other medications as before, and refills have been done if requested.  Please have the pharmacy call with any other refills you may need.  Please continue your efforts at being more active, low cholesterol diet, and weight control.  Please keep your appointments with your specialists as you may have planned  You will be contacted regarding the referral for: cardiac CT score  Please make an Appointment to return in 6 months, or sooner if needed, also with Lab Appointment for testing done 3-5 days before at the FIRST FLOOR Lab (so this is for TWO appointments - please see the scheduling desk as you leave)

## 2022-05-23 DIAGNOSIS — H903 Sensorineural hearing loss, bilateral: Secondary | ICD-10-CM | POA: Diagnosis not present

## 2022-05-24 ENCOUNTER — Encounter: Payer: Self-pay | Admitting: Internal Medicine

## 2022-05-24 NOTE — Assessment & Plan Note (Signed)
BP Readings from Last 3 Encounters:  05/22/22 122/78  04/26/22 133/80  03/27/22 (!) 151/82   Stable, pt to continue medical treatment norvasc 5 mg qd, losartan 100 mg qd

## 2022-05-24 NOTE — Assessment & Plan Note (Addendum)
Lab Results  Component Value Date   HGBA1C 8.1 (H) 02/23/2022   Uncontrolled, pt to continue current medical treatment trulicity 3 mg weekly, metformin 500 mg - 1000 bid and f/u A1c, declines further change for now, also for cardiac CT score

## 2022-05-24 NOTE — Assessment & Plan Note (Signed)
Lab Results  Component Value Date   LDLCALC 50 02/23/2022   Stable, pt to continue current statin zocor 40 mg qd  

## 2022-05-24 NOTE — Assessment & Plan Note (Signed)
Pt encouraged for wt control, low fat DM diet

## 2022-06-26 DIAGNOSIS — H40053 Ocular hypertension, bilateral: Secondary | ICD-10-CM | POA: Diagnosis not present

## 2022-06-30 NOTE — Progress Notes (Signed)
none

## 2022-07-10 ENCOUNTER — Encounter: Payer: PPO | Attending: Internal Medicine | Admitting: Dietician

## 2022-07-10 ENCOUNTER — Encounter: Payer: Self-pay | Admitting: Dietician

## 2022-07-10 VITALS — Ht 67.0 in | Wt 184.0 lb

## 2022-07-10 DIAGNOSIS — E1165 Type 2 diabetes mellitus with hyperglycemia: Secondary | ICD-10-CM | POA: Insufficient documentation

## 2022-07-10 NOTE — Progress Notes (Signed)
Diabetes Self-Management Education  Visit Type: First/Initial  Appt. Start Time: 1030 Appt. End Time: 1135  07/10/2022  [name] [name], identified by name and date of birth, is a 77 y.o. female with a diagnosis of Diabetes: Type 2.   ASSESSMENT Patient is here today alone.  She was last seen by an RD in our office in 2021.  She would like to lose weight, keep better control of her diabetes, be consistent with exercise, and dislikes Trulicity and would like to stop this.  She states that she has increased heartburn from this medication. She states that she has not watched her diet at all.  States that she is getting ready to get back to Weight Watcher's She is now more motivated to make changes.   Referral:  Type 2 Diabetes  History includes:  Type 2 Diabetes, diverticulitis, GERD, HLT, HTN Medications include:  Metformin, Trulicity, simvastatin, losartan, amlodipine Labs:  A1C 8.1% 02/23/2022 increased from 7.8% 05/16/21,  Vitamin B-12 1338, Vitamin D 67 on 05/14/2022, BUN 17, Creatinine 0.97, Potassium 4, GFR 56 02/23/2022 CGM:  FreeStyle LIbre 2     CGM Results from download: 07/10/2022  % Time CGM active:   49 %   (Goal >70%)  Average glucose:   184 mg/dL for 14 days  Glucose management indicator:    %  Time in range (70-180 mg/dL):   48 %   (Goal >40%)  Time High (181-250 mg/dL):   33 %   (Goal < 98%)  Time Very High (>250 mg/dL):    19 %   (Goal < 5%)  Time Low (54-69 mg/dL):   0 %   (Goal <1%)  Time Very Low (<54 mg/dL):   0 %   (Goal <1%)   Weight hx: 67" 184 lbs 07/10/2022 (per home scale this am) 188 lbs 2021 206 lbs ~2014  Patient lives with her husband.  She does most of the shopping and cooking.  She does not like to eat out. Does not eat beef or pork. States that her husband would like to be vegetarian but she does not think they could be vegan. She works helping patients get Medicare drug plans. Enjoys going to the gym but covid disrupted this.  She started a  habit again recently but stopped after going on vacation and has not resumed.  Enjoys reading on the stationary bike. 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    Height 5\' 7"  (1.702 m), weight 184 lb (83.5 kg). Body mass index is 28.82 kg/m.   Diabetes Self-Management Education - 07/10/22 1027       Visit Information   Visit Type First/Initial      Initial Visit   Diabetes Type Type 2    Date Diagnosed 2013    Are you currently following a meal plan? No    Are you taking your medications as prescribed? Yes      Health Coping   How would you rate your overall health? Good      Psychosocial Assessment   Patient Belief/Attitude about Diabetes Motivated to manage diabetes    What is the hardest part about your diabetes right now, causing you the most concern, or is the most worrisome to you about your diabetes?   Taking/obtaining medications    Self-care barriers None    Self-management support Doctor's office    Other persons present Patient    Patient Concerns Nutrition/Meal planning;Healthy Lifestyle;Weight Control    Special Needs None    Preferred Learning  Style No preference indicated    Learning Readiness Ready    How often do you need to have someone help you when you read instructions, pamphlets, or other written materials from your doctor or pharmacy? 1 - Never    What is the last grade level you completed in school? Associate's degree      Pre-Education Assessment   Patient understands the diabetes disease and treatment process. Needs Review    Patient understands incorporating nutritional management into lifestyle. Needs Review    Patient undertands incorporating physical activity into lifestyle. Needs Review    Patient understands using medications safely. Needs Review    Patient understands monitoring blood glucose, interpreting and using results Needs Review    Patient understands prevention, detection, and treatment of acute complications. Needs Review    Patient understands  prevention, detection, and treatment of chronic complications. Needs Review    Patient understands how to develop strategies to address psychosocial issues. Needs Review    Patient understands how to develop strategies to promote health/change behavior. Needs Review      Complications   Last HgB A1C per patient/outside source 8.1 %   02/23/22 increased from 7.8% 05/2021   How often do you check your blood sugar? > 4 times/day    Fasting Blood glucose range (mg/dL) 130-865    Postprandial Blood glucose range (mg/dL) 784-696;>295;284-132    Number of hypoglycemic episodes per month 0    Have you had a dilated eye exam in the past 12 months? Yes    Have you had a dental exam in the past 12 months? Yes    Are you checking your feet? Yes    How many days per week are you checking your feet? 7      Dietary Intake   Breakfast egg in a hole made with olive oil and sugar free orange marmalade    Snack (morning) none    Lunch banana sandwich with mayo or PB on [employer]    Snack (afternoon) none    Dinner chicken, peas, boiled pototoes with butter/olive oil spread, [employer] roll    Snack (evening) oreos 4    Beverage(s) water, black coffee, capucino (90 calories)      Activity / Exercise   Activity / Exercise Type Light (walking / raking leaves)    How many days per week do you exercise? 5    How many minutes per day do you exercise? 20    Total minutes per week of exercise 100      Patient Education   Previous Diabetes Education Yes (please comment)   2021   Disease Pathophysiology Other (comment);Explored patient's options for treatment of their diabetes   review of insulin resistance   Healthy Eating Meal options for control of blood glucose level and chronic complications.    Being Active Role of exercise on diabetes management, blood pressure control and cardiac health.    Medications Reviewed patients medication for diabetes, action, purpose, timing of dose and side effects.    Monitoring  Taught/evaluated CGM (comment);Daily foot exams    Diabetes Stress and Support Identified and addressed patients feelings and concerns about diabetes;Worked with patient to identify barriers to care and solutions;Role of stress on diabetes      Individualized Goals (developed by patient)   Nutrition General guidelines for healthy choices and portions discussed    Physical Activity Exercise 5-7 days per week;30 minutes per day    Medications take my medication as prescribed  Monitoring  Consistenly use CGM    Problem Solving Addressing barriers to behavior change    Reducing Risk examine blood glucose patterns;do foot checks daily;treat hypoglycemia with 15 grams of carbs if blood glucose less than 70mg /dL      Post-Education Assessment   Patient understands the diabetes disease and treatment process. Demonstrates understanding / competency    Patient understands incorporating nutritional management into lifestyle. Comprehends key points    Patient undertands incorporating physical activity into lifestyle. Demonstrates understanding / competency    Patient understands using medications safely. Demonstrates understanding / competency    Patient understands monitoring blood glucose, interpreting and using results Demonstrates understanding / competency    Patient understands prevention, detection, and treatment of acute complications. Demonstrates understanding / competency    Patient understands prevention, detection, and treatment of chronic complications. Demonstrates understanding / competency    Patient understands how to develop strategies to address psychosocial issues. Demonstrates understanding / competency    Patient understands how to develop strategies to promote health/change behavior. Comprehends key points      Outcomes   Expected Outcomes Demonstrated interest in learning. Expect positive outcomes    Future DMSE 3-4 months    Program Status Not Completed              Individualized Plan for Diabetes Self-Management Training:   Learning Objective:  Patient will have a greater understanding of diabetes self-management. Patient education plan is to attend individual and/or group sessions per assessed needs and concerns.   Plan:   Patient Instructions  Patient goals:   Aim to be active most days -   Walking  Pool  Back to the gym tomorrow - bike and machines  Fiber goal - about 25 grams daily as tolerated   Mindfulness:  Consistently scheduled meal - avoid skipping  Choices  Eat slowly  Away from distraction (sitting in kitchen or dining room)  Stop eating when satisfied  Before a snack ask, "Am I hungry or eating for another reason?"   "What can I do instead if I am not hungry?"  Try to find something every day that brings you joy!    Resources: Happy [employer] app [employer] You books and online Full Plate Living Pritikin  Expected Outcomes:  Demonstrated interest in learning. Expect positive outcomes  Education material provided: ADA - How to Thrive: A Guide for Your Journey with Diabetes [employer] developer of Lifestyle Medicine) packet  If problems or questions, patient to contact team via:  phone  Future DSME appointment: 3-4 months

## 2022-07-10 NOTE — Patient Instructions (Addendum)
Patient goals:   Aim to be active most days -   Walking  Pool  Back to the gym tomorrow - bike and machines  Fiber goal - about 25 grams daily as tolerated   Mindfulness:  Consistently scheduled meal - avoid skipping  Choices  Eat slowly  Away from distraction (sitting in kitchen or dining room)  Stop eating when satisfied  Before a snack ask, "Am I hungry or eating for another reason?"   "What can I do instead if I am not hungry?"  Try to find something every day that brings you joy!    Resources: Happy Electronics engineer books and online Full Plate Living Pritikin

## 2022-08-17 ENCOUNTER — Other Ambulatory Visit: Payer: Self-pay | Admitting: Internal Medicine

## 2022-08-17 ENCOUNTER — Other Ambulatory Visit: Payer: Self-pay

## 2022-09-06 ENCOUNTER — Other Ambulatory Visit: Payer: Self-pay | Admitting: Internal Medicine

## 2022-10-09 ENCOUNTER — Other Ambulatory Visit: Payer: Self-pay | Admitting: Obstetrics and Gynecology

## 2022-10-18 DIAGNOSIS — Z08 Encounter for follow-up examination after completed treatment for malignant neoplasm: Secondary | ICD-10-CM | POA: Diagnosis not present

## 2022-10-18 DIAGNOSIS — D229 Melanocytic nevi, unspecified: Secondary | ICD-10-CM | POA: Diagnosis not present

## 2022-10-18 DIAGNOSIS — R229 Localized swelling, mass and lump, unspecified: Secondary | ICD-10-CM | POA: Diagnosis not present

## 2022-10-18 DIAGNOSIS — Z85828 Personal history of other malignant neoplasm of skin: Secondary | ICD-10-CM | POA: Diagnosis not present

## 2022-10-18 DIAGNOSIS — L570 Actinic keratosis: Secondary | ICD-10-CM | POA: Diagnosis not present

## 2022-10-18 DIAGNOSIS — L821 Other seborrheic keratosis: Secondary | ICD-10-CM | POA: Diagnosis not present

## 2022-10-18 DIAGNOSIS — L814 Other melanin hyperpigmentation: Secondary | ICD-10-CM | POA: Diagnosis not present

## 2022-11-05 ENCOUNTER — Other Ambulatory Visit: Payer: Self-pay | Admitting: Internal Medicine

## 2022-11-05 ENCOUNTER — Other Ambulatory Visit: Payer: Self-pay

## 2022-11-15 ENCOUNTER — Other Ambulatory Visit: Payer: PPO

## 2022-11-19 ENCOUNTER — Ambulatory Visit: Payer: PPO | Admitting: Internal Medicine

## 2022-11-26 ENCOUNTER — Ambulatory Visit: Payer: PPO | Admitting: Dietician

## 2023-01-13 ENCOUNTER — Other Ambulatory Visit: Payer: Self-pay | Admitting: Internal Medicine

## 2023-01-14 ENCOUNTER — Other Ambulatory Visit: Payer: Self-pay

## 2023-02-12 LAB — HM DIABETES EYE EXAM

## 2023-02-16 ENCOUNTER — Other Ambulatory Visit: Payer: Self-pay | Admitting: Internal Medicine

## 2023-02-18 ENCOUNTER — Other Ambulatory Visit: Payer: Self-pay

## 2023-02-18 DIAGNOSIS — M7062 Trochanteric bursitis, left hip: Secondary | ICD-10-CM | POA: Diagnosis not present

## 2023-02-18 DIAGNOSIS — M25552 Pain in left hip: Secondary | ICD-10-CM | POA: Insufficient documentation

## 2023-03-04 ENCOUNTER — Encounter: Payer: Self-pay | Admitting: Internal Medicine

## 2023-03-15 ENCOUNTER — Other Ambulatory Visit: Payer: Self-pay | Admitting: Internal Medicine

## 2023-03-15 ENCOUNTER — Other Ambulatory Visit: Payer: Self-pay

## 2023-04-02 DIAGNOSIS — M7062 Trochanteric bursitis, left hip: Secondary | ICD-10-CM | POA: Diagnosis not present

## 2023-04-02 DIAGNOSIS — M7072 Other bursitis of hip, left hip: Secondary | ICD-10-CM | POA: Diagnosis not present

## 2023-04-04 DIAGNOSIS — M6281 Muscle weakness (generalized): Secondary | ICD-10-CM | POA: Insufficient documentation

## 2023-04-11 ENCOUNTER — Other Ambulatory Visit (HOSPITAL_COMMUNITY): Payer: Self-pay

## 2023-04-11 ENCOUNTER — Telehealth: Payer: Self-pay

## 2023-04-11 NOTE — Telephone Encounter (Signed)
 Pharmacy Patient Advocate Encounter  Received notification from [facility] ADVANTAGE/RX ADVANCE that Prior Authorization for Trulicity 3MG /0.5ML auto-injectors has been APPROVED from 04/11/23 to 04/10/24. Ran test claim, Copay is $0. This test claim was processed through [facility]- copay amounts may vary at other pharmacies due to pharmacy/plan contracts, or as the patient moves through the different stages of their insurance plan.   PA #/Case ID/Reference #: [id number]

## 2023-04-15 DIAGNOSIS — M6281 Muscle weakness (generalized): Secondary | ICD-10-CM | POA: Diagnosis not present

## 2023-04-15 DIAGNOSIS — M25552 Pain in left hip: Secondary | ICD-10-CM | POA: Diagnosis not present

## 2023-04-18 DIAGNOSIS — M6281 Muscle weakness (generalized): Secondary | ICD-10-CM | POA: Diagnosis not present

## 2023-04-18 DIAGNOSIS — M25552 Pain in left hip: Secondary | ICD-10-CM | POA: Diagnosis not present

## 2023-04-22 DIAGNOSIS — M25552 Pain in left hip: Secondary | ICD-10-CM | POA: Diagnosis not present

## 2023-04-22 DIAGNOSIS — M6281 Muscle weakness (generalized): Secondary | ICD-10-CM | POA: Diagnosis not present

## 2023-04-25 DIAGNOSIS — M6281 Muscle weakness (generalized): Secondary | ICD-10-CM | POA: Diagnosis not present

## 2023-04-25 DIAGNOSIS — M25552 Pain in left hip: Secondary | ICD-10-CM | POA: Diagnosis not present

## 2023-05-14 DIAGNOSIS — M25552 Pain in left hip: Secondary | ICD-10-CM | POA: Diagnosis not present

## 2023-05-14 DIAGNOSIS — M6281 Muscle weakness (generalized): Secondary | ICD-10-CM | POA: Diagnosis not present

## 2023-05-16 ENCOUNTER — Other Ambulatory Visit: Payer: Self-pay

## 2023-05-16 ENCOUNTER — Other Ambulatory Visit: Payer: Self-pay | Admitting: Internal Medicine

## 2023-05-20 ENCOUNTER — Other Ambulatory Visit: Payer: Self-pay | Admitting: Internal Medicine

## 2023-05-20 ENCOUNTER — Other Ambulatory Visit: Payer: Self-pay

## 2023-05-21 DIAGNOSIS — M25552 Pain in left hip: Secondary | ICD-10-CM | POA: Diagnosis not present

## 2023-05-21 DIAGNOSIS — M6281 Muscle weakness (generalized): Secondary | ICD-10-CM | POA: Diagnosis not present

## 2023-05-22 DIAGNOSIS — M7062 Trochanteric bursitis, left hip: Secondary | ICD-10-CM | POA: Diagnosis not present

## 2023-05-22 DIAGNOSIS — M25552 Pain in left hip: Secondary | ICD-10-CM | POA: Diagnosis not present

## 2023-05-22 DIAGNOSIS — M7072 Other bursitis of hip, left hip: Secondary | ICD-10-CM | POA: Diagnosis not present

## 2023-05-22 DIAGNOSIS — M25569 Pain in unspecified knee: Secondary | ICD-10-CM | POA: Diagnosis not present

## 2023-05-28 DIAGNOSIS — M25552 Pain in left hip: Secondary | ICD-10-CM | POA: Diagnosis not present

## 2023-05-28 DIAGNOSIS — M6281 Muscle weakness (generalized): Secondary | ICD-10-CM | POA: Diagnosis not present

## 2023-06-11 DIAGNOSIS — M6281 Muscle weakness (generalized): Secondary | ICD-10-CM | POA: Diagnosis not present

## 2023-06-11 DIAGNOSIS — M25552 Pain in left hip: Secondary | ICD-10-CM | POA: Diagnosis not present

## 2023-06-13 DIAGNOSIS — M6281 Muscle weakness (generalized): Secondary | ICD-10-CM | POA: Diagnosis not present

## 2023-06-13 DIAGNOSIS — M25552 Pain in left hip: Secondary | ICD-10-CM | POA: Diagnosis not present

## 2023-06-17 ENCOUNTER — Other Ambulatory Visit: Payer: Self-pay

## 2023-06-17 ENCOUNTER — Other Ambulatory Visit: Payer: Self-pay | Admitting: Internal Medicine

## 2023-07-15 ENCOUNTER — Other Ambulatory Visit: Payer: Self-pay | Admitting: Internal Medicine

## 2023-07-15 ENCOUNTER — Other Ambulatory Visit: Payer: Self-pay

## 2023-07-15 DIAGNOSIS — Z1231 Encounter for screening mammogram for malignant neoplasm of breast: Secondary | ICD-10-CM

## 2023-07-18 ENCOUNTER — Other Ambulatory Visit

## 2023-07-18 ENCOUNTER — Other Ambulatory Visit (INDEPENDENT_AMBULATORY_CARE_PROVIDER_SITE_OTHER)

## 2023-07-18 DIAGNOSIS — E119 Type 2 diabetes mellitus without complications: Secondary | ICD-10-CM

## 2023-07-18 LAB — HEPATIC FUNCTION PANEL
ALT: 15 U/L (ref 0–35)
AST: 21 U/L (ref 0–37)
Albumin: 4.5 g/dL (ref 3.5–5.2)
Alkaline Phosphatase: 54 U/L (ref 39–117)
Bilirubin, Direct: 0.3 mg/dL (ref 0.0–0.3)
Total Bilirubin: 1.7 mg/dL — ABNORMAL HIGH (ref 0.2–1.2)
Total Protein: 7.3 g/dL (ref 6.0–8.3)

## 2023-07-18 LAB — LIPID PANEL
Cholesterol: 134 mg/dL (ref 0–200)
HDL: 43.3 mg/dL (ref >39.00)
LDL Cholesterol: 58 mg/dL (ref 0–99)
NonHDL: 91.15
Total CHOL/HDL Ratio: 3
Triglycerides: 165 mg/dL — ABNORMAL HIGH (ref 0.0–149.0)
VLDL: 33 mg/dL (ref 0.0–40.0)

## 2023-07-18 LAB — BASIC METABOLIC PANEL WITH GFR
BUN: 23 mg/dL (ref 6–23)
CO2: 22 meq/L (ref 19–32)
Calcium: 9.7 mg/dL (ref 8.4–10.5)
Chloride: 102 meq/L (ref 96–112)
Creatinine, Ser: 0.98 mg/dL (ref 0.40–1.20)
GFR: 54.85 mL/min — ABNORMAL LOW (ref >60.00)
Glucose, Bld: 205 mg/dL — ABNORMAL HIGH (ref 70–99)
Potassium: 4.4 meq/L (ref 3.5–5.1)
Sodium: 137 meq/L (ref 135–145)

## 2023-07-18 LAB — HEMOGLOBIN A1C: Hgb A1c MFr Bld: 8.6 % — ABNORMAL HIGH (ref 4.6–6.5)

## 2023-07-22 ENCOUNTER — Telehealth: Payer: Self-pay

## 2023-07-22 ENCOUNTER — Encounter: Payer: Self-pay | Admitting: Internal Medicine

## 2023-07-22 ENCOUNTER — Ambulatory Visit (INDEPENDENT_AMBULATORY_CARE_PROVIDER_SITE_OTHER): Admitting: Internal Medicine

## 2023-07-22 VITALS — BP 120/80 | HR 79 | Temp 98.1°F | Ht 67.0 in | Wt 184.0 lb

## 2023-07-22 DIAGNOSIS — R222 Localized swelling, mass and lump, trunk: Secondary | ICD-10-CM | POA: Insufficient documentation

## 2023-07-22 DIAGNOSIS — I10 Essential (primary) hypertension: Secondary | ICD-10-CM | POA: Diagnosis not present

## 2023-07-22 DIAGNOSIS — E559 Vitamin D deficiency, unspecified: Secondary | ICD-10-CM | POA: Diagnosis not present

## 2023-07-22 DIAGNOSIS — E7800 Pure hypercholesterolemia, unspecified: Secondary | ICD-10-CM | POA: Diagnosis not present

## 2023-07-22 DIAGNOSIS — Z7984 Long term (current) use of oral hypoglycemic drugs: Secondary | ICD-10-CM | POA: Diagnosis not present

## 2023-07-22 DIAGNOSIS — Z0000 Encounter for general adult medical examination without abnormal findings: Secondary | ICD-10-CM

## 2023-07-22 DIAGNOSIS — E538 Deficiency of other specified B group vitamins: Secondary | ICD-10-CM | POA: Diagnosis not present

## 2023-07-22 DIAGNOSIS — Z7985 Long-term (current) use of injectable non-insulin antidiabetic drugs: Secondary | ICD-10-CM | POA: Diagnosis not present

## 2023-07-22 DIAGNOSIS — Z0001 Encounter for general adult medical examination with abnormal findings: Secondary | ICD-10-CM

## 2023-07-22 DIAGNOSIS — E1165 Type 2 diabetes mellitus with hyperglycemia: Secondary | ICD-10-CM

## 2023-07-22 MED ORDER — TIRZEPATIDE 2.5 MG/0.5ML ~~LOC~~ SOAJ: 2.5000 mg | SUBCUTANEOUS | 11 refills | Status: DC

## 2023-07-22 NOTE — Assessment & Plan Note (Signed)
 Last vitamin D  Lab Results  Component Value Date   VD25OH 67.72 05/14/2022   Stable, cont oral replacement

## 2023-07-22 NOTE — Assessment & Plan Note (Signed)
 Lab Results  Component Value Date   HGBA1C 8.6 (H) 07/18/2023   uncontrolled, pt to continue current medical treatment metformin  1000 bid, but also change trulicity  to mounjaro  2.5 mg with intent to titrate and hopeful wt loss

## 2023-07-22 NOTE — Assessment & Plan Note (Signed)
 BP Readings from Last 3 Encounters:  07/22/23 120/80  05/22/22 122/78  04/26/22 133/80   Stable, pt to continue medical treatment norvasc  5 every day, losartan  100 qd

## 2023-07-22 NOTE — Patient Instructions (Addendum)
 Please have your Shingrix (shingles) shots done at your local pharmacy, and the Tdap shot as well  Ok to change the trulicity  to mounjaro  2.5 mg weekly, and let us  know in 1 month if you are doing well, in order to increase the dose  Please continue all other medications as before, and refills have been done if requested.  Please have the pharmacy call with any other refills you may need.  Please continue your efforts at being more active, low cholesterol diet, and weight control.  You are otherwise up to date with prevention measures today.  Please keep your appointments with your specialists as you may have planned  Please make an Appointment to return in 6 months, or sooner if needed, also with Lab Appointment for testing done 3-5 days before at the FIRST FLOOR Lab (so this is for TWO appointments - please see the scheduling desk as you leave)

## 2023-07-22 NOTE — Telephone Encounter (Signed)
 Pharmacy Patient Advocate Encounter   Received notification from CoverMyMeds that prior authorization for Mounjaro  2.5MG /0.5ML auto-injectors  is required/requested.   Insurance verification completed.   The patient is insured through [facility] ADVANTAGE/RX ADVANCE .   Per test claim: PA required; PA submitted to above mentioned insurance via CoverMyMeds Key/confirmation #/EOC [id number] Status is pending

## 2023-07-22 NOTE — Progress Notes (Signed)
 Patient ID: [name] [name], female   DOB: 1945-04-19, 78 y.o.   MRN: [id number]         Chief Complaint:: wellness exam and Annual Exam (Pain under the right breast bone.)  , dm, htn, hld, low vit d       HPI:  [name] [name] is a 78 y.o. female here for wellness exam; for tdap and shingrix at pharmacy, o/w up to date                        Also s/p PT for left hip bursitis, also plans to f/u ortho for bilateral knee pain. Also has a small hard area at the right lower sternal border not involving breast tissue, Pt denies chest pain, increased sob or doe, wheezing, orthopnea, PND, increased LE swelling, palpitations, dizziness or syncope.   Pt denies polydipsia, polyuria, or new focal neuro s/s.   Pt denies fever, wt loss, night sweats, loss of appetite, or other constitutional symptoms  Has not been able to lose wt with trulicity    Wt Readings from Last 3 Encounters:  07/22/23 184 lb (83.5 kg)  07/10/22 184 lb (83.5 kg)  05/22/22 186 lb (84.4 kg)   BP Readings from Last 3 Encounters:  07/22/23 120/80  05/22/22 122/78  04/26/22 133/80   Immunization History  Administered Date(s) Administered   Fluad Quad(high Dose 65+) 10/29/2018, 11/04/2019, 11/11/2020, 11/14/2021   Influenza Split 10/16/2011   Influenza Whole 11/02/2008, 12/06/2009   Influenza, High Dose Seasonal PF 11/08/2016, 11/27/2017, 02/13/2023   Influenza,inj,Quad PF,6+ Mos 10/29/2013   Influenza-Unspecified 11/06/2015   PFIZER(Purple Top)SARS-COV-2 Vaccination 03/09/2019, 03/30/2019, 11/06/2019, 08/18/2020, 01/04/2021   Pfizer Covid-19 Vaccine Bivalent Booster 61yrs & up 11/02/2021   Pneumococcal Conjugate-13 06/25/2013   Pneumococcal Polysaccharide-23 11/02/2008, 10/29/2018   Td 02/05/2005   Td (Adult), 2 Lf Tetanus Toxid, Preservative Free 02/05/2005   Health Maintenance Due  Topic Date Due   Zoster Vaccines- Shingrix (1 of 2) Never done   DTaP/Tdap/Td (3 - Tdap) 02/06/2015   Diabetic kidney evaluation - Urine ACR   02/24/2023   Medicare Annual Wellness (AWV)  05/16/2023      Past Medical History:  Diagnosis Date   Cataract    removed both eyes   Diverticulitis    10-06-2019, 11-27-2019   DIVERTICULOSIS, COLON 09/10/2007   DM 03/26/2008   GERD (gastroesophageal reflux disease)    Glaucoma 03/17/2011   HYPERLIPIDEMIA 09/10/2007   HYPERTENSION 09/10/2007   Hypertension    Osteoarthritis 10/21/2011   Shingles 03/17/2011   Past Surgical History:  Procedure Laterality Date   ABDOMINAL HYSTERECTOMY     COLONOSCOPY     HEMATOMA EVACUATION     2003; abdominal GI bleed 2nd-ary to Aspirin  consumption   POLYPECTOMY     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 She has never used smokeless tobacco. She reports current alcohol use. She reports that she does not use drugs. family history includes Hypertension in her father and mother. Allergies  Allergen Reactions   Iodinated Contrast Media Other (See Comments)   Sulfa Antibiotics Hives  Sulfamethoxazole     REACTION: unspecified   Sulfur    Current Outpatient Medications on File Prior to Visit  Medication Sig Dispense Refill   acetaminophen  (TYLENOL ) 500 MG tablet Take 1,000 mg by mouth every 8 (eight) hours as needed for mild pain or moderate pain.     amLODipine  (NORVASC ) 5 MG tablet TAKE 1 TABLET BY MOUTH DAILY 90 tablet 3   bimatoprost (LUMIGAN) 0.01 % SOLN Place 1 drop into both eyes at bedtime.      clotrimazole -betamethasone  (LOTRISONE ) cream APPLY TO THE AFFECTED AREA(S) 2 TIMES A DAY 30 g 1   Continuous Blood Gluc Receiver (FREESTYLE LIBRE 14 DAY READER)  DEVI 1 Device by Does not apply route daily as needed. E11.9 1 each 1   Continuous Glucose Sensor (FREESTYLE LIBRE 14 DAY SENSOR) MISC USE TO CHECK BLOOD SUGAR AND CHANGE EVERY 14 DAYS 6 each 3   Lancet Device MISC Use as directed three times daily E11.9 300 each 3   losartan  (COZAAR ) 100 MG tablet TAKE 1 TABLET BY MOUTH DAILY 90 tablet 3   metFORMIN  (GLUCOPHAGE ) 500 MG tablet TAKE 2 TABLETS BY MOUTH TWICE A DAY WITH MEALS 360 tablet 2   simvastatin  (ZOCOR ) 40 MG tablet TAKE ONE TABLET BY MOUTH DAILY 90 tablet 3   No current facility-administered medications on file prior to visit.        ROS:  All others reviewed and negative.  Objective        PE:  BP 120/80 (BP Location: Left Arm, Patient Position: Sitting, Cuff Size: Normal)   Pulse 79   Temp 98.1 F (36.7 C) (Oral)   Ht 5' 7 (1.702 m)   Wt 184 lb (83.5 kg)   SpO2 98%   BMI 28.82 kg/m                 Constitutional: Pt appears in NAD               HENT: Head: NCAT.                Right Ear: External ear normal.                 Left Ear: External ear normal.                Eyes: . Pupils are equal, round, and reactive to light. Conjunctivae and EOM are normal               Nose: without d/c or deformity               Neck: Neck supple. Gross normal ROM               Cardiovascular: Normal rate and regular rhythm.                 Pulmonary/Chest: Effort normal and breath sounds without rales or wheezing.                Abd:  Soft, NT, ND, + BS, no organomegaly               Neurological: Pt is alert. At baseline orientation, motor grossly intact               Skin: Skin is warm. No rashes, no other new lesions, LE edema - none               Right lower sternal area with small hard bony protuberance fixed without overlying  skin change               Psychiatric: Pt behavior is normal without agitation   Micro: none  Cardiac tracings I have personally interpreted today:  none  Pertinent Radiological findings (summarize):  none   Lab Results  Component Value Date   WBC 11.2 (H) 02/23/2022   HGB 14.3 02/23/2022   HCT 41.8 02/23/2022   PLT 352.0 02/23/2022   GLUCOSE 205 (H) 07/18/2023   CHOL 134 07/18/2023   TRIG 165.0 (H) 07/18/2023   HDL 43.30 07/18/2023   LDLDIRECT 64.0 05/16/2021   LDLCALC 58 07/18/2023   ALT 15 07/18/2023   AST 21 07/18/2023   NA 137 07/18/2023   K 4.4 07/18/2023   CL 102 07/18/2023   CREATININE 0.98 07/18/2023   BUN 23 07/18/2023   CO2 22 07/18/2023   TSH 2.46 02/23/2022   INR 0.93 03/06/2017   HGBA1C 8.6 (H) 07/18/2023   MICROALBUR 2.7 (H) 02/23/2022   Assessment/Plan:  MONTE BRONDER is a 78 y.o. White or Caucasian [1] female with  has a past medical history of Cataract, Diverticulitis, DIVERTICULOSIS, COLON (09/10/2007), DM (03/26/2008), GERD (gastroesophageal reflux disease), Glaucoma (03/17/2011), HYPERLIPIDEMIA (09/10/2007), HYPERTENSION (09/10/2007), Hypertension, Osteoarthritis (10/21/2011), and Shingles (03/17/2011).  Encounter for well adult exam with abnormal findings Age and sex appropriate education and counseling updated with regular exercise and diet Referrals for preventative services - none needed Immunizations addressed - for tdap and shingrix at pharmacy Smoking counseling  - none needed Evidence for depression or other mood disorder - none significant Most recent labs reviewed. I have personally reviewed and have noted: 1) the patient's medical and social history 2) The patient's current medications and supplements 3) The patient's height, weight, and BMI have been recorded in the chart   Diabetes mellitus (HCC) Lab Results  Component Value Date   HGBA1C 8.6 (H) 07/18/2023   uncontrolled, pt to continue current medical treatment metformin  1000 bid, but also change trulicity  to mounjaro  2.5 mg with intent to titrate and hopeful wt loss   Essential hypertension BP Readings from Last 3 Encounters:  07/22/23 120/80  05/22/22 122/78  04/26/22  133/80   Stable, pt to continue medical treatment norvasc  5 every day, losartan  100 qd   Hyperlipidemia Lab Results  Component Value Date   LDLCALC 58 07/18/2023   Stable, pt to continue current statin zocor  40 mg qd   Vitamin D  deficiency Last vitamin D  Lab Results  Component Value Date   VD25OH 67.72 05/14/2022   Stable, cont oral replacement   Chest wall mass 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  Rosalia Colonel, MD 07/22/2023 8:34 PM [facility]

## 2023-07-22 NOTE — Assessment & Plan Note (Signed)
 Most likely c/w costochondiritis reaction, ok to follow for now pending any change in size or other

## 2023-07-22 NOTE — Assessment & Plan Note (Signed)
 Lab Results  Component Value Date   LDLCALC 58 07/18/2023   Stable, pt to continue current statin zocor  40 mg qd

## 2023-07-22 NOTE — Assessment & Plan Note (Signed)

## 2023-07-24 ENCOUNTER — Ambulatory Visit
Admission: RE | Admit: 2023-07-24 | Discharge: 2023-07-24 | Disposition: A | Discharge status: Home / Self Care | Source: Ambulatory Visit | Attending: Internal Medicine | Admitting: Internal Medicine

## 2023-07-24 ENCOUNTER — Other Ambulatory Visit (HOSPITAL_COMMUNITY): Payer: Self-pay

## 2023-07-24 DIAGNOSIS — Z1231 Encounter for screening mammogram for malignant neoplasm of breast: Secondary | ICD-10-CM | POA: Diagnosis not present

## 2023-07-24 NOTE — Telephone Encounter (Signed)
 Pharmacy Patient Advocate Encounter  Received notification from [facility] [employer] that Prior Authorization for Mounjaro  2.5MG /0.5ML auto-injectors  has been APPROVED from 07/22/2023 to 07/21/2024. Unable to obtain price due to refill too soon rejection, last fill date 07/23/2023 next available fill date07/09/2023 Message from Plan [id number]-[id number]:16-JUN-26 Mounjaro  2.5MG /0.5ML [place] [facility] Quantity:2;

## 2023-08-13 DIAGNOSIS — H40013 Open angle with borderline findings, low risk, bilateral: Secondary | ICD-10-CM | POA: Diagnosis not present

## 2023-08-22 ENCOUNTER — Encounter: Payer: Self-pay | Admitting: Internal Medicine

## 2023-08-22 DIAGNOSIS — M7062 Trochanteric bursitis, left hip: Secondary | ICD-10-CM | POA: Diagnosis not present

## 2023-08-23 MED ORDER — TIRZEPATIDE 5 MG/0.5ML ~~LOC~~ SOAJ: 5.0000 mg | SUBCUTANEOUS | 3 refills | Status: DC

## 2023-09-11 ENCOUNTER — Encounter: Payer: Self-pay | Admitting: Internal Medicine

## 2023-09-11 MED ORDER — CLOTRIMAZOLE-BETAMETHASONE 1-0.05 % EX CREA: TOPICAL_CREAM | Freq: Two times a day (BID) | CUTANEOUS | 1 refills | Status: AC | PRN

## 2023-09-14 DIAGNOSIS — M25552 Pain in left hip: Secondary | ICD-10-CM | POA: Diagnosis not present

## 2023-09-19 DIAGNOSIS — S76019A Strain of muscle, fascia and tendon of unspecified hip, initial encounter: Secondary | ICD-10-CM | POA: Insufficient documentation

## 2023-09-20 DIAGNOSIS — S76012D Strain of muscle, fascia and tendon of left hip, subsequent encounter: Secondary | ICD-10-CM | POA: Diagnosis not present

## 2023-09-20 DIAGNOSIS — M7062 Trochanteric bursitis, left hip: Secondary | ICD-10-CM | POA: Diagnosis not present

## 2023-11-15 DIAGNOSIS — M7062 Trochanteric bursitis, left hip: Secondary | ICD-10-CM | POA: Diagnosis not present

## 2023-11-19 DIAGNOSIS — R229 Localized swelling, mass and lump, unspecified: Secondary | ICD-10-CM | POA: Diagnosis not present

## 2023-11-19 DIAGNOSIS — D1801 Hemangioma of skin and subcutaneous tissue: Secondary | ICD-10-CM | POA: Diagnosis not present

## 2023-11-19 DIAGNOSIS — Z08 Encounter for follow-up examination after completed treatment for malignant neoplasm: Secondary | ICD-10-CM | POA: Diagnosis not present

## 2023-11-19 DIAGNOSIS — Z85828 Personal history of other malignant neoplasm of skin: Secondary | ICD-10-CM | POA: Diagnosis not present

## 2023-11-19 DIAGNOSIS — L814 Other melanin hyperpigmentation: Secondary | ICD-10-CM | POA: Diagnosis not present

## 2023-11-19 DIAGNOSIS — L821 Other seborrheic keratosis: Secondary | ICD-10-CM | POA: Diagnosis not present

## 2023-11-19 NOTE — Progress Notes (Signed)
 Please place orders for PAT appointment scheduled 11/20/23.

## 2023-11-19 NOTE — Patient Instructions (Signed)
 SURGICAL WAITING ROOM VISITATION  Patients having surgery or a procedure may have no more than 2 support people in the waiting area - these visitors may rotate.    Children under the age of 66 must have an adult with them who is not the patient.  Visitors with respiratory illnesses are discouraged from visiting and should remain at home.  If the patient needs to stay at the hospital during part of their recovery, the visitor guidelines for inpatient rooms apply. Pre-op nurse will coordinate an appropriate time for 1 support person to accompany patient in pre-op.  This support person may not rotate.    Please refer to the [facility] website for the visitor guidelines for Inpatients (after your surgery is over and you are in a regular room).    Your procedure is scheduled on: 12/09/23   Report to [facility] Main Entrance    Report to admitting at 12:30 PM   Call this number if you have problems the morning of surgery [phone]   Do not eat food :After Midnight.   After Midnight you may have the following liquids until ______ AM/ PM DAY OF SURGERY  Water  Non-Citrus Juices (without pulp, NO RED-Apple, White grape, White cranberry) Black Coffee (NO MILK/CREAM OR CREAMERS, sugar ok)  Clear Tea (NO MILK/CREAM OR CREAMERS, sugar ok) regular and decaf                             Plain Jell-O (NO RED)                                           Fruit ices (not with fruit pulp, NO RED)                                     Popsicles (NO RED)                                                               Sports drinks like Gatorade (NO RED)              Drink 2 Ensure/G2 drinks AT 10:00 PM the night before surgery.        The day of surgery:  Drink ONE (1) Pre-Surgery Clear Ensure or G2 at AM the morning of surgery. Drink in one sitting. Do not sip.  This drink was given to you during your hospital  pre-op appointment visit. Nothing else to drink after completing the   Pre-Surgery Clear Ensure or G2.          If you have questions, please contact your surgeon's office.   FOLLOW BOWEL PREP AND ANY ADDITIONAL PRE OP INSTRUCTIONS YOU RECEIVED FROM YOUR SURGEON'S OFFICE!!!     Oral Hygiene is also important to reduce your risk of infection.                                    Remember - BRUSH YOUR TEETH THE MORNING OF SURGERY  WITH YOUR REGULAR TOOTHPASTE  DENTURES WILL BE REMOVED PRIOR TO SURGERY PLEASE DO NOT APPLY Poly grip OR ADHESIVES!!!   Do NOT smoke after Midnight   Stop all vitamins and herbal supplements 7 days before surgery.   Take these medicines the morning of surgery with A SIP OF WATER :   DO NOT TAKE ANY ORAL DIABETIC MEDICATIONS DAY OF YOUR SURGERY  How to Manage Your Diabetes Before and After Surgery  Why is it important to control my blood sugar before and after surgery? Improving blood sugar levels before and after surgery helps healing and can limit problems. A way of improving blood sugar control is eating a healthy diet by:  Eating less sugar and carbohydrates  Increasing activity/exercise  Talking with your doctor about reaching your blood sugar goals High blood sugars (greater than 180 mg/dL) can raise your risk of infections and slow your recovery, so you will need to focus on controlling your diabetes during the weeks before surgery. Make sure that the doctor who takes care of your diabetes knows about your planned surgery including the date and location.  How do I manage my blood sugar before surgery? Check your blood sugar at least 4 times a day, starting 2 days before surgery, to make sure that the level is not too high or low. Check your blood sugar the morning of your surgery when you wake up and every 2 hours until you get to the Short Stay unit. If your blood sugar is less than 70 mg/dL, you will need to treat for low blood sugar: Do not take insulin . Treat a low blood sugar (less than 70 mg/dL) with  cup of  clear juice (cranberry or apple), 4 glucose tablets, OR glucose gel. Recheck blood sugar in 15 minutes after treatment (to make sure it is greater than 70 mg/dL). If your blood sugar is not greater than 70 mg/dL on recheck, call [phone] for further instructions. Report your blood sugar to the short stay nurse when you get to Short Stay.  If you are admitted to the hospital after surgery: Your blood sugar will be checked by the staff and you will probably be given insulin  after surgery (instead of oral diabetes medicines) to make sure you have good blood sugar levels. The goal for blood sugar control after surgery is 80-180 mg/dL.   WHAT DO I DO ABOUT MY DIABETES MEDICATION?  Do not take oral diabetes medicines (pills) the morning of surgery.  THE NIGHT BEFORE SURGERY, take     units of       insulin .       THE MORNING OF SURGERY, take   units of         insulin .  DO NOT TAKE THE FOLLOWING 7 DAYS PRIOR TO SURGERY: Ozempic , Wegovy, Rybelsus (Semaglutide ), Byetta (exenatide), Bydureon (exenatide ER), Victoza, Saxenda (liraglutide), or Trulicity  (dulaglutide ) Mounjaro  (Tirzepatide ) Adlyxin (Lixisenatide), Polyethylene Glycol Loxenatide.  If your CBG is greater than 220 mg/dL, you may take  of your sliding scale  (correction) dose of insulin .    For patients with insulin  pumps: Contact your diabetes doctor for specific instructions before surgery. Decrease basal rates by 20% at midnight the night before your surgery. Note that if your surgery is planned to be longer than 2 hours, your insulin  pump will be removed and intravenous (IV) insulin  will be started and managed by the nurses and the anesthesiologist. You will be able to restart your insulin  pump once you are awake and able to  manage it.  Make sure to bring insulin  pump supplies to the hospital with you in case the  site needs to be changed.  Patient Signature:  Date:   Nurse Signature:  Date:   Reviewed and Endorsed by [facility] Patient Education Committee, August 2015  Bring CPAP mask and tubing day of surgery.                              You may not have any metal on your body including hair pins, jewelry, and body piercing             Do not wear make-up, lotions, powders, perfumes/cologne, or deodorant  Do not wear nail polish including gel and S&S, artificial/acrylic nails, or any other type of covering on natural nails including finger and toenails. If you have artificial nails, gel coating, etc. that needs to be removed by a nail salon please have this removed prior to surgery or surgery may need to be canceled/ delayed if the surgeon/ anesthesia feels like they are unable to be safely monitored.   Do not shave  48 hours prior to surgery.               Men may shave face and neck.   Do not bring valuables to the hospital. [facility] IS NOT             RESPONSIBLE   FOR VALUABLES.   Contacts, glasses, dentures or bridgework may not be worn into surgery.  DO NOT BRING YOUR HOME MEDICATIONS TO THE HOSPITAL. PHARMACY WILL DISPENSE MEDICATIONS LISTED ON YOUR MEDICATION LIST TO YOU DURING YOUR ADMISSION IN THE HOSPITAL!    Patients discharged on the day of surgery will not be allowed to drive home.  Someone NEEDS to stay with you for the first 24 hours after anesthesia.   Special Instructions: Bring a copy of your healthcare power of attorney and living will documents the day of surgery if you haven't scanned them before.              Please read over the following fact sheets you were given: IF YOU HAVE QUESTIONS ABOUT YOUR PRE-OP INSTRUCTIONS PLEASE CALL [phone] [place].   If you received a COVID test during your pre-op visit  it is requested that you wear a mask when out in public, stay away from anyone that may not be feeling well and notify your surgeon if you develop symptoms. If you test positive for Covid or have been in contact with anyone that has tested positive in the last 10 days  please notify you surgeon.    [facility] - Preparing for Surgery Before surgery, you can play an important role.  Because skin is not sterile, your skin needs to be as free of germs as possible.  You can reduce the number of germs on your skin by washing with CHG (chlorahexidine gluconate) soap before surgery.  CHG is an antiseptic cleaner which kills germs and bonds with the skin to continue killing germs even after washing. Please DO NOT use if you have an allergy to CHG or antibacterial soaps.  If your skin becomes reddened/irritated stop using the CHG and inform your nurse when you arrive at Short Stay. Do not shave (including legs and underarms) for at least 48 hours prior to the first CHG shower.  You may shave your face/neck.  Please follow these instructions carefully:  1.  Shower with CHG Soap the night before surgery ONLY (DO NOT USE THE SOAP THE MORNING OF SURGERY).  2.  If you choose to wash your hair, wash your hair first as usual with your normal  shampoo.  3.  After you shampoo, rinse your hair and body thoroughly to remove the shampoo.                             4.  Use CHG as you would any other liquid soap.  You can apply chg directly to the skin and wash.  Gently with a scrungie or clean washcloth.  5.  Apply the CHG Soap to your body ONLY FROM THE NECK DOWN.   Do   not use on face/ open                           Wound or open sores. Avoid contact with eyes, ears mouth and   genitals (private parts).                       Wash face,  Genitals (private parts) with your normal soap.             6.  Wash thoroughly, paying special attention to the area where your    surgery  will be performed.  7.  Thoroughly rinse your body with warm water  from the neck down.  8.  DO NOT shower/wash with your normal soap after using and rinsing off the CHG Soap.                9.  Pat yourself dry with a clean towel.            10.  Wear clean pajamas.            11.  Place clean sheets on your  bed the night of your first shower and do not  sleep with pets. Day of Surgery : Do not apply any CHG, lotions/deodorants the morning of surgery.  Please wear clean clothes to the hospital/surgery center.  FAILURE TO FOLLOW THESE INSTRUCTIONS MAY RESULT IN THE CANCELLATION OF YOUR SURGERY  PATIENT SIGNATURE_________________________________  NURSE SIGNATURE__________________________________  ________________________________________________________________________

## 2023-11-19 NOTE — Progress Notes (Signed)
 COVID Vaccine Completed: yes  Date of COVID positive in last 90 days:  PCP - Lynwood Rush, MD Cardiologist - n/a  Chest x-ray - N/A EKG - 11/20/23 Epic/chart Stress Test - N/A ECHO - N/A Cardiac Cath - n/a Pacemaker/ICD device last checked:N/A Spinal Cord Stimulator:N/A  Bowel Prep - N/A  Sleep Study - N/A CPAP -   Fasting Blood Sugar - 130-140 Checks Blood Sugar usually  has freestyle libre  Last dose of GLP1 agonist-  Mounjaro , takes on Sundays. Last dose 12/01/23 GLP1 instructions:  Do not take after  12/01/23   Last dose of SGLT-2 inhibitors-  N/A SGLT-2 instructions:  Do not take after     Blood Thinner Instructions: N/A Last dose:   Time: Aspirin  Instructions:N/A Last Dose:  Activity level: Can go up a flight of stairs and perform activities of daily living without stopping and without symptoms of chest pain or shortness of breath. Not able to put weight on left leg due to injury. Using cane at all times  Anesthesia review: N/A  Patient denies shortness of breath, fever, cough and chest pain at PAT appointment  Patient verbalized understanding of instructions that were given to them at the PAT appointment. Patient was also instructed that they will need to review over the PAT instructions again at home before surgery.

## 2023-11-20 ENCOUNTER — Encounter (HOSPITAL_COMMUNITY): Payer: Self-pay

## 2023-11-20 ENCOUNTER — Encounter (HOSPITAL_COMMUNITY)
Admission: RE | Admit: 2023-11-20 | Discharge: 2023-11-20 | Disposition: A | Discharge status: Home / Self Care | Source: Ambulatory Visit | Attending: Orthopedic Surgery | Admitting: Orthopedic Surgery

## 2023-11-20 ENCOUNTER — Other Ambulatory Visit: Payer: Self-pay

## 2023-11-20 VITALS — BP 160/75 | HR 76 | Temp 97.9°F | Resp 16 | Ht 67.0 in | Wt 183.0 lb

## 2023-11-20 DIAGNOSIS — Z01810 Encounter for preprocedural cardiovascular examination: Secondary | ICD-10-CM | POA: Diagnosis present

## 2023-11-20 DIAGNOSIS — Z01812 Encounter for preprocedural laboratory examination: Secondary | ICD-10-CM | POA: Diagnosis present

## 2023-11-20 DIAGNOSIS — E1165 Type 2 diabetes mellitus with hyperglycemia: Secondary | ICD-10-CM | POA: Diagnosis not present

## 2023-11-20 DIAGNOSIS — R9431 Abnormal electrocardiogram [ECG] [EKG]: Secondary | ICD-10-CM | POA: Insufficient documentation

## 2023-11-20 DIAGNOSIS — Z01818 Encounter for other preprocedural examination: Secondary | ICD-10-CM | POA: Insufficient documentation

## 2023-11-20 HISTORY — DX: Malignant (primary) neoplasm, unspecified: C80.1

## 2023-11-20 LAB — CBC
HCT: 44.6 % (ref 36.0–46.0)
Hemoglobin: 14.2 g/dL (ref 12.0–15.0)
MCH: 30 pg (ref 26.0–34.0)
MCHC: 31.8 g/dL (ref 30.0–36.0)
MCV: 94.1 fL (ref 80.0–100.0)
Platelets: 291 K/uL (ref 150–400)
RBC: 4.74 MIL/uL (ref 3.87–5.11)
RDW: 12.3 % (ref 11.5–15.5)
WBC: 8.8 K/uL (ref 4.0–10.5)
nRBC: 0 % (ref 0.0–0.2)

## 2023-11-20 LAB — GLUCOSE, CAPILLARY: Glucose-Capillary: 207 mg/dL — ABNORMAL HIGH (ref 70–99)

## 2023-11-20 LAB — HEMOGLOBIN A1C
Hgb A1c MFr Bld: 7.7 % — ABNORMAL HIGH (ref 4.8–5.6)
Mean Plasma Glucose: 174.29 mg/dL

## 2023-11-20 LAB — BASIC METABOLIC PANEL WITH GFR
Anion gap: 13 (ref 5–15)
BUN: 24 mg/dL — ABNORMAL HIGH (ref 8–23)
CO2: 22 mmol/L (ref 22–32)
Calcium: 9.8 mg/dL (ref 8.9–10.3)
Chloride: 101 mmol/L (ref 98–111)
Creatinine, Ser: 0.95 mg/dL (ref 0.44–1.00)
GFR, Estimated: >60 mL/min (ref >60)
Glucose, Bld: 206 mg/dL — ABNORMAL HIGH (ref 70–99)
Potassium: 4.9 mmol/L (ref 3.5–5.1)
Sodium: 136 mmol/L (ref 135–145)

## 2023-12-02 NOTE — H&P (Signed)
 ORTHOPEDIC PRE-OPERATIVE H&P  Patient is admitted for left hip bursectomy and gluteal tendon repair.  Subjective:  Chief Complaint: Left hip pain  HPI: [name] [name] is a 78 year old female who presents for evaluation of left hip pain and dysfunction. She reports that the issue began in January and was initially treated by Dr. Delane who suspected bursitis. She received an injection for bursitis, which did not alleviate her symptoms. Subsequently, an MRI was performed in August, revealing a tear in the gluteus medius tendon with fluid accumulation around the tear site. She denies significant pain but states she cannot bear weight on her left leg, resulting in a limp and the need to use a cane frequently. She has been undergoing physical therapy since January, which did not improve her symptoms. She also performed exercises, including squats, but discontinued them during a month-long vacation, noting a reduction in hip pain during that time. She currently takes Celebrex twice daily, which may contribute to her lack of pain. Despite the absence of significant pain, she experiences functional limitations, including weakness and difficulty walking. She denies any specific injury or event that caused the tendon tear.  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   Atrophic vaginitis 03/23/2020   Trochanteric bursitis of left hip 08/16/2017   Wellness examination 05/11/2017   Cough 05/11/2017   Stiffness of right knee 03/26/2017    History of arthroplasty of left knee 03/05/2017   Bleeding diathesis 05/31/2016   Blood coagulation disorder 05/31/2016   Arthritis of knee 07/20/2015   Knee pain 07/06/2015   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   Basal cell carcinoma of skin 06/29/2015   Osteoarthritis 10/21/2011   Right-sided sensorineural hearing loss 10/21/2011   Glaucoma 03/17/2011   Herpes zoster 03/17/2011   Diabetes mellitus (HCC) 03/26/2008   Hyperlipidemia 09/10/2007   Essential hypertension 09/10/2007   Diverticulosis of colon 09/10/2007   EPISTAXIS, RECURRENT 09/10/2007    Past Medical History:  Diagnosis Date   Cancer (HCC)    skin- basal   Cataract    removed both eyes   Diverticulitis    10-06-2019, 11-27-2019   DIVERTICULOSIS, COLON 09/10/2007   DM 03/26/2008   GERD (gastroesophageal reflux disease)    Glaucoma 03/17/2011   HYPERLIPIDEMIA 09/10/2007   HYPERTENSION 09/10/2007   Hypertension    Osteoarthritis 10/21/2011   Shingles 03/17/2011    Past Surgical History:  Procedure Laterality Date   ABDOMINAL HYSTERECTOMY     COLONOSCOPY     HEMATOMA EVACUATION     2003; abdominal GI bleed 2nd-ary to Aspirin  consumption   POLYPECTOMY     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   Procedure: LEFT TOTAL KNEE ARTHROPLASTY;  Surgeon: Melodi Lerner, MD;  Location: [facility];  Service: Orthopedics;  Laterality: Left;  with canal block   TOTAL KNEE ARTHROPLASTY Right 03/11/2017   Procedure: RIGHT TOTAL KNEE ARTHROPLASTY;  Surgeon: Melodi Lerner, MD;  Location: [facility];  Service: Orthopedics;  Laterality: Right;   TUBAL LIGATION      Prior to Admission medications  Medication Sig Start Date End Date Taking? Authorizing Provider  amLODipine  (NORVASC ) 5 MG tablet TAKE 1 TABLET BY MOUTH DAILY Patient taking differently: Take 5 mg by mouth daily after breakfast. 02/18/23  Yes Norleen Lynwood ORN, MD  Apoaequorin (PREVAGEN PO) Take 1 tablet by mouth daily after breakfast.   Yes [provider]  bimatoprost (LUMIGAN) 0.01 % SOLN Place 1 drop into both eyes at bedtime.    Yes [provider]  clotrimazole -betamethasone  (LOTRISONE ) cream Apply topically 2 (two) times daily as needed. 09/11/23  Yes Norleen Lynwood ORN, MD  losartan  (COZAAR ) 100 MG tablet TAKE 1 TABLET BY MOUTH DAILY Patient taking differently: Take 100 mg by mouth daily after breakfast. 05/16/23  Yes Norleen Lynwood ORN, MD  metFORMIN  (GLUCOPHAGE ) 500 MG tablet TAKE 2 TABLETS BY MOUTH TWICE A DAY WITH MEALS 05/16/23  Yes Norleen Lynwood ORN, MD  simvastatin  (ZOCOR ) 40 MG tablet TAKE ONE TABLET BY MOUTH DAILY Patient taking differently: Take 40 mg by mouth daily after breakfast. 05/16/23  Yes Norleen Lynwood ORN, MD  tirzepatide  (MOUNJARO ) 5 MG/0.5ML Pen Inject 5 mg into the skin once a week. Patient taking differently: Inject 5 mg into the skin every Sunday. 08/23/23  Yes Norleen Lynwood ORN, MD  acetaminophen  (TYLENOL ) 500 MG tablet Take 1,000 mg by mouth every 8 (eight) hours as needed for mild pain or moderate pain.    [provider]  celecoxib (CELEBREX) 100 MG capsule Take 100 mg by mouth 2 (two) times daily.    [provider]  Continuous Blood Gluc Receiver (FREESTYLE LIBRE 14 DAY READER) DEVI 1 Device by Does not apply route daily as needed. E11.9 05/26/19   Norleen Lynwood ORN, MD  Continuous Glucose Sensor (FREESTYLE LIBRE 14 DAY SENSOR) MISC USE TO CHECK BLOOD SUGAR AND CHANGE EVERY 14 DAYS 06/17/23   Norleen Lynwood ORN, MD  Lancet Device MISC Use as directed three times daily E11.9 05/04/19   Norleen Lynwood ORN, MD    Allergies  Allergen Reactions   Iodinated Contrast Media Other (See Comments)   Sulfa Antibiotics Hives   Sulfamethoxazole     REACTION: unspecified   Sulfur     Social History   Socioeconomic History   Marital status: Married    Spouse name: Not on file   Number of children: Not on file   Years of education: Not on file   Highest education level: Associate degree: occupational, scientist, product/process development, or  vocational program  Occupational History   Not on file  Tobacco Use   Smoking status: Never   Smokeless tobacco: Never  Vaping Use   Vaping status: Never Used  Substance and Sexual Activity   Alcohol use: Yes    Comment: occ: social   Drug use: No   Sexual activity: Not Currently  Other Topics Concern   Not on file  Social History Narrative   Not on file   Social Drivers of Health   Financial Resource Strain: Low Risk  (07/18/2023)   Overall Financial Resource Strain (CARDIA)    Difficulty of Paying Living Expenses: Not hard at all  Food Insecurity: No Food Insecurity (07/18/2023)   Hunger Vital Sign    Worried About Running Out of Food in the Last Year: Never true    Ran Out of Food in the Last Year: Never true  Transportation Needs: No Transportation Needs (07/18/2023)   PRAPARE - Administrator, Civil Service (Medical): No    Lack of Transportation (Non-Medical): No  Physical Activity: Insufficiently Active (07/18/2023)   Exercise Vital Sign    Days of Exercise per Week: 2 days    Minutes of Exercise per Session: 10 min  Stress: No Stress Concern Present (07/18/2023)   [employer] of Occupational Health - Occupational Stress Questionnaire    Feeling of Stress: Not at all  Social Connections: Socially Integrated (07/18/2023)   Social Connection and Isolation Panel    Frequency of Communication with Friends and Family: More than three times a week    Frequency of Social Gatherings with Friends and Family: Three times a week    Attends Religious Services: More than 4 times per year    Active Member of Clubs or Organizations: Yes    Attends Banker Meetings: More than 4 times per year    Marital Status: Married  Catering Manager Violence: Not At Risk (05/16/2022)   Humiliation, Afraid, Rape, and Kick questionnaire    Fear of Current or Ex-Partner: No    Emotionally Abused: No    Physically Abused: No    Sexually Abused: No    Tobacco Use:  Low Risk  (11/20/2023)   Patient History    Smoking Tobacco Use: Never    Smokeless Tobacco Use: Never    Passive Exposure: Not on file   Social History   Substance and Sexual Activity  Alcohol Use Yes   Comment: occ: social    Family History  Problem Relation Age of Onset   Hypertension Mother    Hypertension Father    Colon cancer Neg Hx    Esophageal cancer Neg Hx    Colon polyps Neg Hx    Rectal cancer Neg Hx    Stomach cancer Neg Hx     ROS   Objective:  Physical Exam: - Well-developed female alert and oriented in no apparent distress  - Left hip flexion to 120 degrees, rotation to 30 degrees, and abduction to 40 degrees without pain or limitation in range of motion.  - Tenderness over the greater trochanter.  - Sniffling gait pattern on the left with an abductor lurch.  IMAGING:  MRI of the left hip performed in August demonstrates a gluteus medius tendon tear with slight retraction and fluid accumulation. No evidence of muscle atrophy is noted. Mild degenerative changes in the joint are present.  Assessment/Plan:  ASSESSMENT:  Left hip dysfunction characterized by weakness and an abductor limp due to a gluteus medius tendon tear. Despite the absence of significant pain, the tear is contributing to functional limitations. The patient has tried conservative measures, including physical therapy and exercises, without improvement. Surgical intervention is considered to potentially restore function and prevent further tendon damage.     PLAN:  The patient has elected to proceed with surgical intervention to address the gluteus medius tendon tear. The planned procedure includes a bursectomy and gluteal tendon repair, which will be performed as an outpatient surgery. Postoperative rehabilitation will involve physical therapy starting six weeks after surgery to allow the tendon to heal. The patient was counseled on the surgical process, including the use of light  general anesthesia, recovery expectations, and postoperative restrictions such as avoiding lateral leg movements and stair climbing with the surgical leg. Risks and benefits of surgery were discussed, including the possibility of improving function but no guarantee of regaining full strength. The patient is planning to be discharged home.   Patient's anticipated LOS is less than 2 midnights, meeting these requirements: - Younger than 34 - Lives within 1  hour of care - Has a competent adult at home to recover with post-op recover - NO history of  - Chronic pain requiring opiods  - Coronary Artery Disease  - Heart failure  - Heart attack  - Stroke  - DVT/VTE  - Cardiac arrhythmia  - Respiratory Failure/COPD  - Renal failure  - Anemia  - Advanced Liver disease   Corean Sender, PA-C Orthopedic Surgery [facility] [place]

## 2023-12-09 ENCOUNTER — Ambulatory Visit (HOSPITAL_COMMUNITY): Admitting: Anesthesiology

## 2023-12-09 ENCOUNTER — Encounter (HOSPITAL_COMMUNITY): Payer: Self-pay | Admitting: Orthopedic Surgery

## 2023-12-09 ENCOUNTER — Other Ambulatory Visit: Payer: Self-pay

## 2023-12-09 ENCOUNTER — Ambulatory Visit (HOSPITAL_COMMUNITY)
Admission: RE | Admit: 2023-12-09 | Discharge: 2023-12-09 | Disposition: A | Discharge status: Home / Self Care | Attending: Orthopedic Surgery | Admitting: Orthopedic Surgery

## 2023-12-09 ENCOUNTER — Ambulatory Visit (HOSPITAL_COMMUNITY): Payer: Self-pay | Admitting: Physician Assistant

## 2023-12-09 ENCOUNTER — Encounter (HOSPITAL_COMMUNITY)
Admission: RE | Disposition: A | Discharge status: Home / Self Care | Payer: Self-pay | Source: Home / Self Care | Attending: Orthopedic Surgery

## 2023-12-09 DIAGNOSIS — Z791 Long term (current) use of non-steroidal anti-inflammatories (NSAID): Secondary | ICD-10-CM | POA: Insufficient documentation

## 2023-12-09 DIAGNOSIS — Z7984 Long term (current) use of oral hypoglycemic drugs: Secondary | ICD-10-CM | POA: Insufficient documentation

## 2023-12-09 DIAGNOSIS — E1165 Type 2 diabetes mellitus with hyperglycemia: Secondary | ICD-10-CM

## 2023-12-09 DIAGNOSIS — Z79899 Other long term (current) drug therapy: Secondary | ICD-10-CM | POA: Diagnosis not present

## 2023-12-09 DIAGNOSIS — M7062 Trochanteric bursitis, left hip: Secondary | ICD-10-CM | POA: Diagnosis not present

## 2023-12-09 DIAGNOSIS — M7072 Other bursitis of hip, left hip: Secondary | ICD-10-CM | POA: Insufficient documentation

## 2023-12-09 DIAGNOSIS — I10 Essential (primary) hypertension: Secondary | ICD-10-CM | POA: Insufficient documentation

## 2023-12-09 DIAGNOSIS — M67854 Other specified disorders of tendon, left hip: Secondary | ICD-10-CM

## 2023-12-09 DIAGNOSIS — Z7985 Long-term (current) use of injectable non-insulin antidiabetic drugs: Secondary | ICD-10-CM | POA: Diagnosis not present

## 2023-12-09 DIAGNOSIS — X58XXXA Exposure to other specified factors, initial encounter: Secondary | ICD-10-CM | POA: Insufficient documentation

## 2023-12-09 DIAGNOSIS — S76012A Strain of muscle, fascia and tendon of left hip, initial encounter: Secondary | ICD-10-CM | POA: Diagnosis not present

## 2023-12-09 DIAGNOSIS — E119 Type 2 diabetes mellitus without complications: Secondary | ICD-10-CM | POA: Insufficient documentation

## 2023-12-09 DIAGNOSIS — H409 Unspecified glaucoma: Secondary | ICD-10-CM | POA: Diagnosis not present

## 2023-12-09 DIAGNOSIS — M1710 Unilateral primary osteoarthritis, unspecified knee: Secondary | ICD-10-CM | POA: Diagnosis not present

## 2023-12-09 HISTORY — PX: GLUTEUS MINIMUS REPAIR: SHX5843

## 2023-12-09 LAB — GLUCOSE, CAPILLARY
Glucose-Capillary: 173 mg/dL — ABNORMAL HIGH (ref 70–99)
Glucose-Capillary: 119 mg/dL — ABNORMAL HIGH (ref 70–99)

## 2023-12-09 SURGERY — REPAIR, TENDON, GLUTEUS MINIMUS
Anesthesia: General | Site: Hip | Laterality: Left

## 2023-12-09 MED ORDER — BUPIVACAINE-EPINEPHRINE (PF) 0.25% -1:200000 IJ SOLN
INTRAMUSCULAR | Status: AC
  Filled 2023-12-09: qty 30

## 2023-12-09 MED ORDER — ASPIRIN 81 MG PO TBEC: 81.0000 mg | DELAYED_RELEASE_TABLET | Freq: Every day | ORAL | 0 refills | Status: AC

## 2023-12-09 MED ORDER — ORAL CARE MOUTH RINSE: 15.0000 mL | Freq: Once | OROMUCOSAL | Status: AC

## 2023-12-09 MED ORDER — DEXAMETHASONE SOD PHOSPHATE PF 10 MG/ML IJ SOLN
8.0000 mg | Freq: Once | INTRAMUSCULAR | Status: AC
  Administered 2023-12-09: 8 mg via INTRAVENOUS

## 2023-12-09 MED ORDER — PROPOFOL 10 MG/ML IV BOLUS
INTRAVENOUS | Status: AC
  Filled 2023-12-09: qty 20

## 2023-12-09 MED ORDER — CEFAZOLIN SODIUM-DEXTROSE 2-4 GM/100ML-% IV SOLN
2.0000 g | INTRAVENOUS | Status: AC
  Administered 2023-12-09: 2 g via INTRAVENOUS
  Filled 2023-12-09: qty 100

## 2023-12-09 MED ORDER — HYDROMORPHONE HCL 2 MG/ML IJ SOLN
INTRAMUSCULAR | Status: AC
  Filled 2023-12-09: qty 1

## 2023-12-09 MED ORDER — SUGAMMADEX SODIUM 200 MG/2ML IV SOLN
INTRAVENOUS | Status: DC | PRN
  Administered 2023-12-09: 200 mg via INTRAVENOUS

## 2023-12-09 MED ORDER — SUGAMMADEX SODIUM 200 MG/2ML IV SOLN
INTRAVENOUS | Status: AC
  Filled 2023-12-09: qty 2

## 2023-12-09 MED ORDER — BUPIVACAINE-EPINEPHRINE (PF) 0.25% -1:200000 IJ SOLN
INTRAMUSCULAR | Status: DC | PRN
  Administered 2023-12-09: 30 mL via PERINEURAL

## 2023-12-09 MED ORDER — ONDANSETRON HCL 4 MG/2ML IJ SOLN
INTRAMUSCULAR | Status: AC
  Filled 2023-12-09: qty 2

## 2023-12-09 MED ORDER — LIDOCAINE HCL (PF) 2 % IJ SOLN
INTRAMUSCULAR | Status: AC
  Filled 2023-12-09: qty 5

## 2023-12-09 MED ORDER — HYDROMORPHONE HCL 1 MG/ML IJ SOLN
INTRAMUSCULAR | Status: DC | PRN
  Administered 2023-12-09: .4 mg via INTRAVENOUS
  Administered 2023-12-09: .2 mg via INTRAVENOUS

## 2023-12-09 MED ORDER — LACTATED RINGERS IV SOLN: INTRAVENOUS | Status: DC

## 2023-12-09 MED ORDER — ACETAMINOPHEN 10 MG/ML IV SOLN
1000.0000 mg | Freq: Four times a day (QID) | INTRAVENOUS | Status: DC
  Administered 2023-12-09: 1000 mg via INTRAVENOUS
  Filled 2023-12-09: qty 100

## 2023-12-09 MED ORDER — FENTANYL CITRATE (PF) 100 MCG/2ML IJ SOLN
INTRAMUSCULAR | Status: DC | PRN
  Administered 2023-12-09 (×2): 50 ug via INTRAVENOUS

## 2023-12-09 MED ORDER — LIDOCAINE HCL (CARDIAC) PF 100 MG/5ML IV SOSY
PREFILLED_SYRINGE | INTRAVENOUS | Status: DC | PRN
  Administered 2023-12-09: 100 mg via INTRAVENOUS

## 2023-12-09 MED ORDER — LACTATED RINGERS IV SOLN
INTRAVENOUS | Status: DC | PRN
Start: 2023-12-09 — End: 2023-12-09

## 2023-12-09 MED ORDER — ONDANSETRON HCL 4 MG/2ML IJ SOLN
INTRAMUSCULAR | Status: DC | PRN
  Administered 2023-12-09: 4 mg via INTRAVENOUS

## 2023-12-09 MED ORDER — FENTANYL CITRATE (PF) 100 MCG/2ML IJ SOLN
INTRAMUSCULAR | Status: AC
  Filled 2023-12-09: qty 2

## 2023-12-09 MED ORDER — INSULIN ASPART 100 UNIT/ML IJ SOLN: 0.0000 [IU] | INTRAMUSCULAR | Status: DC | PRN

## 2023-12-09 MED ORDER — METHOCARBAMOL 500 MG PO TABS: 500.0000 mg | ORAL_TABLET | Freq: Four times a day (QID) | ORAL | 0 refills | Status: AC | PRN

## 2023-12-09 MED ORDER — PROPOFOL 10 MG/ML IV BOLUS
INTRAVENOUS | Status: DC | PRN
  Administered 2023-12-09: 30 mg via INTRAVENOUS
  Administered 2023-12-09: 120 mg via INTRAVENOUS

## 2023-12-09 MED ORDER — ROCURONIUM BROMIDE 10 MG/ML (PF) SYRINGE
PREFILLED_SYRINGE | INTRAVENOUS | Status: AC
  Filled 2023-12-09: qty 10

## 2023-12-09 MED ORDER — 0.9 % SODIUM CHLORIDE (POUR BTL) OPTIME
TOPICAL | Status: DC | PRN
  Administered 2023-12-09: 1000 mL

## 2023-12-09 MED ORDER — MIDAZOLAM HCL (PF) 2 MG/2ML IJ SOLN: 1.0000 mg | INTRAMUSCULAR | Status: DC

## 2023-12-09 MED ORDER — HYDROCODONE-ACETAMINOPHEN 5-325 MG PO TABS: 1.0000 | ORAL_TABLET | Freq: Four times a day (QID) | ORAL | 0 refills | Status: AC | PRN

## 2023-12-09 MED ORDER — FENTANYL CITRATE (PF) 50 MCG/ML IJ SOSY: 50.0000 ug | PREFILLED_SYRINGE | INTRAMUSCULAR | Status: DC

## 2023-12-09 MED ORDER — LACTATED RINGERS IV SOLN
INTRAVENOUS | Status: DC
Start: 2023-12-09 — End: 2023-12-09

## 2023-12-09 MED ORDER — CHLORHEXIDINE GLUCONATE 0.12 % MT SOLN
15.0000 mL | Freq: Once | OROMUCOSAL | Status: AC
  Administered 2023-12-09: 15 mL via OROMUCOSAL

## 2023-12-09 MED ORDER — ROCURONIUM BROMIDE 100 MG/10ML IV SOLN
INTRAVENOUS | Status: DC | PRN
  Administered 2023-12-09: 50 mg via INTRAVENOUS

## 2023-12-09 SURGICAL SUPPLY — 40 items
ANCHOR SUT BIO SW 4.75X19.1 (Anchor) IMPLANT
BAG COUNTER SPONGE SURGICOUNT (BAG) IMPLANT
BAG ZIPLOCK 12X15 (MISCELLANEOUS) ×1 IMPLANT
BIT DRILL 2.8X128 (BIT) IMPLANT
COVER SURGICAL LIGHT HANDLE (MISCELLANEOUS) ×1 IMPLANT
DERMABOND ADVANCED .7 DNX12 (GAUZE/BANDAGES/DRESSINGS) ×1 IMPLANT
DRAPE POUCH INSTRU U-SHP 10X18 (DRAPES) ×1 IMPLANT
DRAPE SURG ORHT 6 SPLT 77X108 (DRAPES) ×2 IMPLANT
DRAPE U-SHAPE 47X51 STRL (DRAPES) ×1 IMPLANT
DRSG AQUACEL AG ADV 3.5X10 (GAUZE/BANDAGES/DRESSINGS) ×1 IMPLANT
DURAPREP 26ML APPLICATOR (WOUND CARE) ×1 IMPLANT
ELECT REM PT RETURN 15FT ADLT (MISCELLANEOUS) ×1 IMPLANT
EVACUATOR 1/8 PVC DRAIN (DRAIN) IMPLANT
FACESHIELD WRAPAROUND OR TEAM (MASK) ×2 IMPLANT
GLOVE BIO SURGEON STRL SZ 6.5 (GLOVE) IMPLANT
GLOVE BIO SURGEON STRL SZ8 (GLOVE) ×1 IMPLANT
GLOVE BIOGEL PI IND STRL 7.0 (GLOVE) ×1 IMPLANT
GLOVE BIOGEL PI IND STRL 8 (GLOVE) ×1 IMPLANT
GOWN STRL REUS W/ TWL LRG LVL3 (GOWN DISPOSABLE) ×2 IMPLANT
KIT BASIN OR (CUSTOM PROCEDURE TRAY) ×1 IMPLANT
KIT TURNOVER KIT A (KITS) ×1 IMPLANT
MANIFOLD NEPTUNE II (INSTRUMENTS) ×1 IMPLANT
NDL MA TROC 1/2 (NEEDLE) ×1 IMPLANT
NDL SAFETY ECLIPSE 18X1.5 (NEEDLE) ×1 IMPLANT
NDL TAPERED W/ NITINOL LOOP (MISCELLANEOUS) IMPLANT
NEEDLE MA TROC 1/2 (NEEDLE) IMPLANT
NEEDLE TAPERED W/ NITINOL LOOP (MISCELLANEOUS) ×1 IMPLANT
NS IRRIG 1000ML POUR BTL (IV SOLUTION) ×1 IMPLANT
PACK TOTAL JOINT (CUSTOM PROCEDURE TRAY) ×1 IMPLANT
PASSER SUT SWANSON 36MM LOOP (INSTRUMENTS) IMPLANT
PENCIL SMOKE EVACUATOR (MISCELLANEOUS) ×1 IMPLANT
PROTECTOR NERVE ULNAR (MISCELLANEOUS) ×1 IMPLANT
SUT ETHIBOND NAB CT1 #1 30IN (SUTURE) IMPLANT
SUT MNCRL AB 4-0 PS2 18 (SUTURE) ×1 IMPLANT
SUT VIC AB 2-0 CT1 TAPERPNT 27 (SUTURE) ×2 IMPLANT
SUTURE STRATFX 0 PDS 27 VIOLET (SUTURE) ×1 IMPLANT
SUTURE TAPE 1.3 40 TPR END (SUTURE) IMPLANT
SYR 30ML LL (SYRINGE) ×1 IMPLANT
TOWEL GREEN STERILE FF (TOWEL DISPOSABLE) ×1 IMPLANT
TOWEL OR 17X26 10 PK STRL BLUE (TOWEL DISPOSABLE) ×1 IMPLANT

## 2023-12-09 NOTE — Brief Op Note (Signed)
 12/09/2023  2:40 PM  PATIENT:  [name] [name]  78 y.o. female  PRE-OPERATIVE DIAGNOSIS:  Left hip bursitis and left hip glutual tendon tear  POST-OPERATIVE DIAGNOSIS:  Left hip bursitis and left hip glutual tendon tear  PROCEDURE:  Procedure(s) with comments: LEFT HIP BURSECTOMY AND GLUTEAL TENDON REPAIR (Left) - Left hip bursectomy and gluteal tendon repair  SURGEON:  Surgeons and Role:    DEWAINE Melodi Lerner, MD - Primary  PHYSICIAN ASSISTANT:   ASSISTANTS: Corean Sender, PA-C   ANESTHESIA:   general  EBL: 10 ml  BLOOD ADMINISTERED:none  DRAINS: none   LOCAL MEDICATIONS USED:  MARCAINE      COUNTS:  YES  TOURNIQUET:  * No tourniquets in log *  DICTATION: .Other Dictation: Dictation Number [id number]  PLAN OF CARE: Discharge from PACU  PATIENT DISPOSITION:  PACU - hemodynamically stable.

## 2023-12-09 NOTE — Anesthesia Postprocedure Evaluation (Signed)
 Anesthesia Post Note  Patient: [name] [name]  Procedure(s) Performed: LEFT HIP BURSECTOMY AND GLUTEAL TENDON REPAIR (Left: Hip)     Patient location during evaluation: PACU Anesthesia Type: General Level of consciousness: awake and alert Pain management: pain level controlled Vital Signs Assessment: post-procedure vital signs reviewed and stable Respiratory status: spontaneous breathing, nonlabored ventilation and respiratory function stable Cardiovascular status: blood pressure returned to baseline Postop Assessment: no apparent nausea or vomiting Anesthetic complications: no   No notable events documented.  Last Vitals:  Vitals:   12/09/23 1137 12/09/23 1500  BP:  (!) 153/67  Pulse:  85  Resp: 16 14  Temp: (!) 36.4 C 36.4 C  SpO2:  100%    Last Pain:  Vitals:   12/09/23 1157  TempSrc:   PainSc: 0-No pain                 Vertell Row

## 2023-12-09 NOTE — Op Note (Unsigned)
 NAME: [name] [name], [name] [name] MEDICAL RECORD NO: [id number] ACCOUNT NO: [id number] DATE OF BIRTH: 1946-01-21 FACILITY: [facility] LOCATION: [facility] PHYSICIAN: Dempsey GAILS. Omarian Jaquith, MD  Operative Report   DATE OF PROCEDURE: 12/09/2023  PREOPERATIVE DIAGNOSIS:  Left hip intractable bursitis with gluteus medius tendon tear.  POSTOPERATIVE DIAGNOSIS:  Left hip intractable bursitis with gluteus medius tendon tear.  PROCEDURE:  Left hip bursectomy and gluteus medius tendon repair.  SURGEON:  Dempsey GAILS. Aryana Wonnacott, MD  ASSISTANT:  Corean Sender, PA-C  ANESTHESIA:  General.  ESTIMATED BLOOD LOSS:  10 mL.  DRAINS:  None.  COMPLICATIONS:  None.  CONDITION:  Stable to recovery.  BRIEF CLINICAL NOTE:  The patient is an 78 year old female with a long history of intractable left hip pain.  Hip pain has been nonresponsive to nonoperative measures.  MRI showed a gluteus medius tendon tear.  She has failed nonoperative management and  presents now for bursectomy and tendon repair.  PROCEDURE IN DETAIL:  After successful administration of general anesthetic, the patient was placed in the right lateral decubitus position with the left side up and held with the hip positioner.  Left thigh was then prepped and draped in the usual  sterile fashion.  An approximately 4-inch incision was made centered at the tip of the greater trochanter in line with the femur.  Skin was cut with a 10 blade through subcutaneous tissue to the level of the fascia lata, which was incised in line with  the skin incision.  Severe amount of fluid in the trochanteric bursa and upon opening the bursa, a large amount of fluid was expressed.  We excised the bursa to get down to the gluteus medius tendon.  There was a pretty significant tear on the posterior  and anterior leaflets of the tendon retracted from the bone.  The tendon quality is still very good.  We used the FiberTape and passed the suture through the anterior leaflet and a suture  through the posterior leaflet.  We then created the trough for the  Arthrex SwiveLock anchor and passed the sutures through the anchor and then tightened the anchor down into the trough.  This was a very stable repair.  It was then oversewed utilizing the suture ends.  This completely closed the gap between the leaflets  and also between the tendon and bone.  Again, I felt this was a very stable repair.  We thoroughly irrigated with saline solution and injected the subcutaneous tissue with 20 mL of 0.25% Marcaine  with epinephrine .  The fascia lata was closed with a  running 0 Stratafix suture.  Subcutaneous closed with interrupted 2-0 Vicryl and subcuticular running 4-0 Monocryl.  Incisions cleaned and dried and a sterile dressing applied.  She has been awakened and transported to recovery in stable condition.   VAI D: 12/09/2023 2:46:01 pm T: 12/09/2023 9:17:00 pm  JOB: [id number]/ [id number]

## 2023-12-09 NOTE — Anesthesia Procedure Notes (Signed)
 Procedure Name: Intubation Date/Time: 12/09/2023 1:43 PM  Performed by: Deeann Eva BROCKS, CRNAPre-anesthesia Checklist: Patient identified, Emergency Drugs available, Suction available and Patient being monitored Patient Re-evaluated:Patient Re-evaluated prior to induction Oxygen Delivery Method: Circle System Utilized Preoxygenation: Pre-oxygenation with 100% oxygen Induction Type: IV induction Ventilation: Mask ventilation without difficulty Laryngoscope Size: Mac and 3 Grade View: Grade I Tube type: Oral Tube size: 7.0 mm Number of attempts: 1 Airway Equipment and Method: Stylet and Oral airway Placement Confirmation: ETT inserted through vocal cords under direct vision, positive ETCO2 and breath sounds checked- equal and bilateral Secured at: 21 cm Tube secured with: Tape Dental Injury: Teeth and Oropharynx as per pre-operative assessment

## 2023-12-09 NOTE — Discharge Instructions (Addendum)
Ollen Gross, MD [facility] [place] [address], [address] [place], [place] [address] [phone]  BURSECTOMY / GLUTEAL TENDON REPAIR POSTOPERATIVE INSTRUCTIONS  HOME CARE INSTRUCTIONS  . Remove items at home which could result in a fall. This includes throw rugs or furniture in walking pathways.   ICE to the affected hip every three hours for 30 minutes at a time and then as needed for pain and swelling. Continue to use ice on the hip for pain and swelling from surgery. You may notice swelling that will progress down to the foot and ankle. This is normal after surgery. Elevate the leg when you are not up walking on it.    Continue to use the breathing machine which will help keep your temperature down. It is common for your temperature to cycle up and down following surgery, especially at night when you are not up moving around and exerting yourself. The breathing machine keeps your lungs expanded and your temperature down. . Sit on high chairs which makes it easier to stand.  . Sit on chairs with arms. Use the chair arms to help push yourself up when arising.   No active abduction of the leg (do not pull leg out to the side away from the body).  Use your walker for first several days until comfortable ambulating.  DIET You may resume your previous home diet once you are discharged from the hospital.  DRESSING / WOUND CARE / SHOWERING . You will have an adhesive waterproof bandage over the incision. Leave this in place until your first follow-up appointment. . You may shower three days after surgery, but do not submerge the incision under water.   ACTIVITY . Walk with your walker as instructed. [name] Use walker as long as suggested by your caregivers. . Avoid periods of inactivity such as sitting longer than an hour when not asleep. This helps prevent blood clots.  . You may resume a sexual relationship in one month or when given the OK by your  doctor.  . You may return to work once you are cleared by your doctor.  . Do not drive a car for 6 weeks or until released by you surgeon.  . Do not drive while taking narcotics.  WEIGHT BEARING Weight bearing as tolerated with assist device (walker, cane, etc) as directed, use it until comfortable ambulating.  POSTOPERATIVE CONSTIPATION PROTOCOL Constipation - defined medically as fewer than three stools per week and severe constipation as less than one stool per week.  One of the most common issues patients have following surgery is constipation.  Even if you have a regular bowel pattern at home, your normal regimen is likely to be disrupted due to multiple reasons following surgery.  Combination of anesthesia, postoperative narcotics, change in appetite and fluid intake all can affect your bowels.  In order to avoid complications following surgery, here are some recommendations in order to help you during your recovery period.  Colace (docusate) - Pick up an over-the-counter form of Colace or another stool softener and take twice a day as long as you are requiring postoperative pain medications.  Take with a full glass of water daily.  If you experience loose stools or diarrhea, hold the colace until you stool forms back up.  If your symptoms do not get better within 1 week or if they get worse, check with your doctor.  Dulcolax (bisacodyl) - Pick up over-the-counter and take as directed by the product packaging  as needed to assist with the movement of your bowels.  Take with a full glass of water.  Use this product as needed if not relieved by Colace only.   MiraLax (polyethylene glycol) - Pick up over-the-counter to have on hand.  MiraLax is a solution that will increase the amount of water in your bowels to assist with bowel movements.  Take as directed and can mix with a glass of water, juice, soda, coffee, or tea.  Take if you go more than two days without a movement. Do not use MiraLax  more than once per day. Call your doctor if you are still constipated or irregular after using this medication for 7 days in a row.  If you continue to have problems with postoperative constipation, please contact the office for further assistance and recommendations.  If you experience "the worst abdominal pain ever" or develop nausea or vomiting, please contact the office immediatly for further recommendations for treatment.  ITCHING  If you experience itching with your medications, try taking only a single pain pill, or even half a pain pill at a time.  You can also use Benadryl over the counter for itching or also to help with sleep.   TED HOSE STOCKINGS Wear the elastic stockings on both legs for three weeks following surgery during the day but you may remove then at night for sleeping.  MEDICATIONS See your medication summary on the "After Visit Summary" that the nursing staff will review with you prior to discharge.  You may have some home medications which will be placed on hold until you complete the course of blood thinner medication.  It is important for you to complete the blood thinner medication as prescribed by your surgeon.  Continue your approved medications as instructed at time of discharge.  PRECAUTIONS If you experience chest pain or shortness of breath - call 911 immediately for transfer to the hospital emergency department.  If you develop a fever greater that 101 F, purulent drainage from wound, increased redness or drainage from wound, foul odor from the wound/dressing, or calf pain - CONTACT YOUR SURGEON.                                                   FOLLOW-UP APPOINTMENTS Make sure you keep all of your appointments after your operation with your surgeon and caregivers. You should call the office at the above phone number and make an appointment for approximately two weeks after the date of your surgery or on the date instructed by your surgeon outlined in the "After  Visit Summary".   Pick up stool softner and laxative for home use following surgery while on pain medications. Do not submerge incision under water. Please use good hand washing techniques while changing dressing each day. May shower starting three days after surgery. Please use a clean towel to pat the incision dry following showers. Continue to use ice for pain and swelling after surgery. Do not use any lotions or creams on the incision until instructed by your surgeon.

## 2023-12-09 NOTE — Interval H&P Note (Signed)
 History and Physical Interval Note:  12/09/2023 11:29 AM  [name]  has presented today for surgery, with the diagnosis of Left hip bursitis and left hip glutual tendon tear.  The various methods of treatment have been discussed with the patient and family. After consideration of risks, benefits and other options for treatment, the patient has consented to  Procedure(s) with comments: REPAIR, TENDON, GLUTEUS MINIMUS (Left) - Left hip bursectomy and gluteal tendon repair as a surgical intervention.  The patient's history has been reviewed, patient examined, no change in status, stable for surgery.  I have reviewed the patient's chart and labs.  Questions were answered to the patient's satisfaction.     [name]

## 2023-12-09 NOTE — Anesthesia Preprocedure Evaluation (Addendum)
 Anesthesia Evaluation  Patient identified by MRN, date of birth, ID band Patient awake    Reviewed: Allergy & Precautions, NPO status , Patient's Chart, lab work & pertinent test results  History of Anesthesia Complications Negative for: history of anesthetic complications  Airway Mallampati: I  TM Distance: >3 FB Neck ROM: Full    Dental no notable dental hx.    Pulmonary neg pulmonary ROS   Pulmonary exam normal        Cardiovascular hypertension, Pt. on medications Normal cardiovascular exam     Neuro/Psych negative neurological ROS     GI/Hepatic Neg liver ROS,GERD  ,,  Endo/Other  diabetes, Type 2, Oral Hypoglycemic Agents    Renal/GU negative Renal ROS     Musculoskeletal  (+) Arthritis ,  Left hip bursitis and left hip glutual tendon tear   Abdominal   Peds  Hematology negative hematology ROS (+)   Anesthesia Other Findings glaucoma  Reproductive/Obstetrics                              Anesthesia Physical Anesthesia Plan  ASA: 2  Anesthesia Plan: General   Post-op Pain Management: Tylenol  PO (pre-op)*   Induction: Intravenous  PONV Risk Score and Plan: 3 and Treatment may vary due to age or medical condition, Ondansetron  and Dexamethasone   Airway Management Planned: Oral ETT  Additional Equipment: None  Intra-op Plan:   Post-operative Plan: Extubation in OR  Informed Consent: I have reviewed the patients History and Physical, chart, labs and discussed the procedure including the risks, benefits and alternatives for the proposed anesthesia with the patient or authorized representative who has indicated his/her understanding and acceptance.     Dental advisory given  Plan Discussed with: CRNA  Anesthesia Plan Comments:          Anesthesia Quick Evaluation

## 2023-12-09 NOTE — Transfer of Care (Signed)
 Immediate Anesthesia Transfer of Care Note  Patient: [name] [name]  Procedure(s) Performed: LEFT HIP BURSECTOMY AND GLUTEAL TENDON REPAIR (Left: Hip)  Patient Location: PACU  Anesthesia Type:General  Level of Consciousness: awake and alert   Airway & Oxygen Therapy: Patient Spontanous Breathing and Patient connected to face mask oxygen  Post-op Assessment: Report given to RN and Post -op Vital signs reviewed and stable  Post vital signs: Reviewed and stable  Last Vitals:  Vitals Value Taken Time  BP 153/67 12/09/23 15:00  Temp    Pulse 84 12/09/23 15:01  Resp 13 12/09/23 15:01  SpO2 100 % 12/09/23 15:01  Vitals shown include unfiled device data.  Last Pain:  Vitals:   12/09/23 1157  TempSrc:   PainSc: 0-No pain         Complications: No notable events documented.

## 2023-12-10 ENCOUNTER — Encounter (HOSPITAL_COMMUNITY): Payer: Self-pay | Admitting: Orthopedic Surgery

## 2023-12-18 DIAGNOSIS — M7072 Other bursitis of hip, left hip: Secondary | ICD-10-CM | POA: Diagnosis not present

## 2024-02-04 ENCOUNTER — Encounter: Payer: Self-pay | Admitting: Internal Medicine

## 2024-02-04 ENCOUNTER — Ambulatory Visit: Admitting: Internal Medicine

## 2024-02-04 VITALS — BP 120/80 | HR 73 | Temp 98.1°F | Ht 67.0 in | Wt 177.0 lb

## 2024-02-04 DIAGNOSIS — R8290 Unspecified abnormal findings in urine: Secondary | ICD-10-CM

## 2024-02-04 DIAGNOSIS — E1165 Type 2 diabetes mellitus with hyperglycemia: Secondary | ICD-10-CM | POA: Diagnosis not present

## 2024-02-04 DIAGNOSIS — I10 Essential (primary) hypertension: Secondary | ICD-10-CM | POA: Diagnosis not present

## 2024-02-04 DIAGNOSIS — E559 Vitamin D deficiency, unspecified: Secondary | ICD-10-CM | POA: Diagnosis not present

## 2024-02-04 DIAGNOSIS — Z7985 Long-term (current) use of injectable non-insulin antidiabetic drugs: Secondary | ICD-10-CM

## 2024-02-04 DIAGNOSIS — E7800 Pure hypercholesterolemia, unspecified: Secondary | ICD-10-CM

## 2024-02-04 NOTE — Progress Notes (Unsigned)
 Patient ID: [name] [name], female   DOB: [birth year], 78 y.o.   MRN: [id number]        Chief Complaint: follow up urinary frequency, dm, htn, hld, low vit d       HPI:  [name] [name] is a 78 y.o. female here with 3 days onset mild urinary frequency but  Denies urinary symptoms such as dysuria, urgency, flank pain, hematuria or n/v, fever, chills.   Pt denies chest pain, increased sob or doe, wheezing, orthopnea, PND, increased LE swelling, palpitations, dizziness or syncope.   Pt denies polydipsia, polyuria, or new focal neuro s/s.  Pt denies fever, wt loss, night sweats, loss of appetite, or other constitutional symptoms  S/p left hip surgical procedure, to start PT jan 12.  Wt Readings from Last 3 Encounters:  02/04/24 177 lb (80.3 kg)  12/09/23 182 lb 15.7 oz (83 kg)  11/20/23 183 lb (83 kg)   BP Readings from Last 3 Encounters:  02/04/24 120/80  12/09/23 (!) 147/74  11/20/23 (!) 160/75         Past Medical History:  Diagnosis Date   Cancer (HCC)    skin- basal   Cataract    removed both eyes   Diverticulitis    10-06-2019, 11-27-2019   DIVERTICULOSIS, COLON 09/10/2007   DM 03/26/2008   GERD (gastroesophageal reflux disease)    Glaucoma 03/17/2011   HYPERLIPIDEMIA 09/10/2007   HYPERTENSION 09/10/2007   Hypertension    Osteoarthritis 10/21/2011   Shingles 03/17/2011   Past Surgical History:  Procedure Laterality Date   ABDOMINAL HYSTERECTOMY     COLONOSCOPY     GLUTEUS MINIMUS REPAIR Left 12/09/2023   Procedure: LEFT HIP BURSECTOMY AND GLUTEAL TENDON REPAIR;  Surgeon: Melodi Lerner, MD;  Location: [facility];  Service: Orthopedics;  Laterality: Left;  Left hip bursectomy and gluteal tendon repair   HEMATOMA EVACUATION     2003; abdominal GI bleed 2nd-ary to Aspirin  consumption   POLYPECTOMY     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 She has never used smokeless tobacco. She reports current alcohol use. She reports that she does not use drugs. family history includes Hypertension in her father and mother. Allergies[1] Medications Ordered Prior to Encounter[2]      ROS:  All others reviewed and negative.  Objective        PE:  BP 120/80 (BP Location: Right Arm, Patient Position: Sitting, Cuff Size: Normal)   Pulse 73   Temp 98.1 F (36.7 C) (Oral)   Ht 5' 7 (1.702 m)   Wt 177 lb (80.3 kg)   SpO2 98%   BMI 27.72 kg/m                 Constitutional: Pt appears in NAD               HENT: Head: NCAT.                Right Ear: External ear normal.                 Left Ear: External ear normal.  Eyes: . Pupils are equal, round, and reactive to light. Conjunctivae and EOM are normal               Nose: without d/c or deformity               Neck: Neck supple. Gross normal ROM               Cardiovascular: Normal rate and regular rhythm.                 Pulmonary/Chest: Effort normal and breath sounds without rales or wheezing.                Abd:  Soft, NT, ND, + BS, no organomegaly               Neurological: Pt is alert. At baseline orientation, motor grossly intact               Skin: Skin is warm. No rashes, no other new lesions, LE edema - none               Psychiatric: Pt behavior is normal without agitation   Micro: none  Cardiac tracings I have personally interpreted today:  none  Pertinent Radiological findings (summarize): none   Lab Results  Component Value Date   WBC 8.8 11/20/2023   HGB 14.2 11/20/2023   HCT 44.6 11/20/2023   PLT 291 11/20/2023   GLUCOSE 206 (H) 11/20/2023   CHOL 134 07/18/2023   TRIG 165.0 (H) 07/18/2023   HDL  43.30 07/18/2023   LDLDIRECT 64.0 05/16/2021   LDLCALC 58 07/18/2023   ALT 15 07/18/2023   AST 21 07/18/2023   NA 136 11/20/2023   K 4.9 11/20/2023   CL 101 11/20/2023   CREATININE 0.95 11/20/2023   BUN 24 (H) 11/20/2023   CO2 22 11/20/2023   TSH 2.46 02/23/2022   INR 0.93 03/06/2017   HGBA1C 7.7 (H) 11/20/2023   Assessment/Plan:  [name] [name] is a 78 y.o. White or Caucasian [1] female with  has a past medical history of Cancer (HCC), Cataract, Diverticulitis, DIVERTICULOSIS, COLON (09/10/2007), DM (03/26/2008), GERD (gastroesophageal reflux disease), Glaucoma (03/17/2011), HYPERLIPIDEMIA (09/10/2007), HYPERTENSION (09/10/2007), Hypertension, Osteoarthritis (10/21/2011), and Shingles (03/17/2011).  Vitamin D  deficiency Last vitamin D  Lab Results  Component Value Date   VD25OH 67.72 05/14/2022   Stable, cont oral replacement   Hyperlipidemia Lab Results  Component Value Date   LDLCALC 58 07/18/2023   Stable, pt to continue current statin metformin  500 mg - 2 bid, mounjaro  5 mg weekly   Essential hypertension BP Readings from Last 3 Encounters:  02/04/24 120/80  12/09/23 (!) 147/74  11/20/23 (!) 160/75   Stable, pt to continue medical treatment norvasc  5 every day, losartan  100 mg qd   Diabetes mellitus (HCC) Lab Results  Component Value Date   HGBA1C 7.7 (H) 11/20/2023   Uncontrolled mild, pt to continue current medical treatment metformin  500mg  - 2 bid, mounjaro  5 mg weekly with improved diet and cont'd wt loss   Cloudy urine ? Clinical significance - for ua and cx with labs, and tx pending results  Followup: Return in about 6 months (around 08/04/2024).  Lynwood Rush, MD 02/05/2024 8:15 PM [facility]     [1]  Allergies Allergen Reactions   Iodinated Contrast Media Other (See Comments)   Sulfa Antibiotics Hives   Sulfamethoxazole     REACTION: unspecified   Sulfur   [  2]   Current Outpatient Medications on File Prior to Visit  Medication Sig Dispense Refill   acetaminophen  (TYLENOL ) 500 MG tablet Take 1,000 mg by mouth every 8 (eight) hours as needed for mild pain or moderate pain.     amLODipine  (NORVASC ) 5 MG tablet TAKE 1 TABLET BY MOUTH DAILY (Patient taking differently: Take 5 mg by mouth daily after breakfast.) 90 tablet 3   Apoaequorin (PREVAGEN PO) Take 1 tablet by mouth daily after breakfast.     bimatoprost (LUMIGAN) 0.01 % SOLN Place 1 drop into both eyes at bedtime.      celecoxib (CELEBREX) 100 MG capsule Take 100 mg by mouth 2 (two) times daily.     clotrimazole -betamethasone  (LOTRISONE ) cream Apply topically 2 (two) times daily as needed. 30 g 1   Continuous Blood Gluc Receiver (FREESTYLE LIBRE 14 DAY READER) DEVI 1 Device by Does not apply route daily as needed. E11.9 1 each 1   Continuous Glucose Sensor (FREESTYLE LIBRE 14 DAY SENSOR) MISC USE TO CHECK BLOOD SUGAR AND CHANGE EVERY 14 DAYS 6 each 3   HYDROcodone -acetaminophen  (NORCO/VICODIN) 5-325 MG tablet Take 1 tablet by mouth every 6 (six) hours as needed for moderate pain (pain score 4-6). 20 tablet 0   Lancet Device MISC Use as directed three times daily E11.9 300 each 3   losartan  (COZAAR ) 100 MG tablet TAKE 1 TABLET BY MOUTH DAILY (Patient taking differently: Take 100 mg by mouth daily after breakfast.) 90 tablet 3   metFORMIN  (GLUCOPHAGE ) 500 MG tablet TAKE 2 TABLETS BY MOUTH TWICE A DAY WITH MEALS 360 tablet 2   methocarbamol  (ROBAXIN ) 500 MG tablet Take 1 tablet (500 mg total) by mouth every 6 (six) hours as needed for muscle spasms. 30 tablet 0   simvastatin  (ZOCOR ) 40 MG tablet TAKE ONE TABLET BY MOUTH DAILY (Patient taking differently: Take 40 mg by mouth daily after breakfast.) 90 tablet 3   tirzepatide  (MOUNJARO ) 5 MG/0.5ML Pen Inject 5 mg into the skin once a week. (Patient taking differently: Inject 5 mg into the skin every Sunday.) 6 mL 3   No current facility-administered  medications on file prior to visit.

## 2024-02-04 NOTE — Patient Instructions (Signed)
 Please continue all other medications as before, including the current mounjaro  as is for now  Please have the pharmacy call with any other refills you may need.  Please continue your efforts at being more active, low cholesterol diet, and weight control.  Please keep your appointments with your specialists as you may have planned  Please go to the LAB at the blood drawing area for the tests to be done  You will be contacted by phone if any changes need to be made immediately.  Otherwise, you will receive a letter about your results with an explanation, but please check with MyChart first.  Please make an Appointment to return in 6 months, or sooner if needed

## 2024-02-05 ENCOUNTER — Encounter: Payer: Self-pay | Admitting: Internal Medicine

## 2024-02-05 DIAGNOSIS — R8290 Unspecified abnormal findings in urine: Secondary | ICD-10-CM | POA: Insufficient documentation

## 2024-02-05 NOTE — Assessment & Plan Note (Signed)
 Lab Results  Component Value Date   HGBA1C 7.7 (H) 11/20/2023   Uncontrolled mild, pt to continue current medical treatment metformin  500mg  - 2 bid, mounjaro  5 mg weekly with improved diet and cont'd wt loss

## 2024-02-05 NOTE — Assessment & Plan Note (Signed)
?   Clinical significance - for ua and cx with labs, and tx pending results

## 2024-02-05 NOTE — Assessment & Plan Note (Signed)
 Lab Results  Component Value Date   LDLCALC 58 07/18/2023   Stable, pt to continue current statin metformin  500 mg - 2 bid, mounjaro  5 mg weekly

## 2024-02-05 NOTE — Assessment & Plan Note (Signed)
 BP Readings from Last 3 Encounters:  02/04/24 120/80  12/09/23 (!) 147/74  11/20/23 (!) 160/75   Stable, pt to continue medical treatment norvasc  5 every day, losartan  100 mg qd

## 2024-02-05 NOTE — Assessment & Plan Note (Signed)
 Last vitamin D  Lab Results  Component Value Date   VD25OH 67.72 05/14/2022   Stable, cont oral replacement

## 2024-02-07 ENCOUNTER — Other Ambulatory Visit

## 2024-02-07 ENCOUNTER — Ambulatory Visit: Payer: Self-pay | Admitting: Internal Medicine

## 2024-02-07 ENCOUNTER — Other Ambulatory Visit: Payer: Self-pay | Admitting: Internal Medicine

## 2024-02-07 DIAGNOSIS — E1165 Type 2 diabetes mellitus with hyperglycemia: Secondary | ICD-10-CM | POA: Diagnosis not present

## 2024-02-07 DIAGNOSIS — E538 Deficiency of other specified B group vitamins: Secondary | ICD-10-CM | POA: Diagnosis not present

## 2024-02-07 DIAGNOSIS — R8290 Unspecified abnormal findings in urine: Secondary | ICD-10-CM

## 2024-02-07 DIAGNOSIS — E559 Vitamin D deficiency, unspecified: Secondary | ICD-10-CM

## 2024-02-07 LAB — HEPATIC FUNCTION PANEL
ALT: 13 U/L (ref 3–35)
AST: 18 U/L (ref 5–37)
Albumin: 4.4 g/dL (ref 3.5–5.2)
Alkaline Phosphatase: 76 U/L (ref 39–117)
Bilirubin, Direct: 0.1 mg/dL (ref 0.1–0.3)
Total Bilirubin: 0.9 mg/dL (ref 0.2–1.2)
Total Protein: 6.9 g/dL (ref 6.0–8.3)

## 2024-02-07 LAB — URINALYSIS, ROUTINE W REFLEX MICROSCOPIC
Bilirubin Urine: NEGATIVE
Hgb urine dipstick: NEGATIVE
Ketones, ur: NEGATIVE
Nitrite: NEGATIVE
Specific Gravity, Urine: 1.01 (ref 1.000–1.030)
Total Protein, Urine: NEGATIVE
Urine Glucose: NEGATIVE
Urobilinogen, UA: 0.2 (ref 0.0–1.0)
pH: 6 (ref 5.0–8.0)

## 2024-02-07 LAB — VITAMIN D 25 HYDROXY (VIT D DEFICIENCY, FRACTURES): VITD: 40.27 ng/mL (ref 30.00–100.00)

## 2024-02-07 LAB — BASIC METABOLIC PANEL WITH GFR
BUN: 23 mg/dL (ref 6–23)
CO2: 23 meq/L (ref 19–32)
Calcium: 8.9 mg/dL (ref 8.4–10.5)
Chloride: 103 meq/L (ref 96–112)
Creatinine, Ser: 0.83 mg/dL (ref 0.40–1.20)
GFR: 66.69 mL/min
Glucose, Bld: 214 mg/dL — ABNORMAL HIGH (ref 70–99)
Potassium: 4.1 meq/L (ref 3.5–5.1)
Sodium: 137 meq/L (ref 135–145)

## 2024-02-07 LAB — LIPID PANEL
Cholesterol: 137 mg/dL (ref 28–200)
HDL: 49.4 mg/dL
LDL Cholesterol: 64 mg/dL (ref 10–99)
NonHDL: 87.33
Total CHOL/HDL Ratio: 3
Triglycerides: 118 mg/dL (ref 10.0–149.0)
VLDL: 23.6 mg/dL (ref 0.0–40.0)

## 2024-02-07 LAB — VITAMIN B12: Vitamin B-12: 363 pg/mL (ref 211–911)

## 2024-02-07 LAB — HEMOGLOBIN A1C: Hgb A1c MFr Bld: 7.6 % — ABNORMAL HIGH (ref 4.6–6.5)

## 2024-02-07 MED ORDER — TIRZEPATIDE 7.5 MG/0.5ML ~~LOC~~ SOAJ: 7.5000 mg | SUBCUTANEOUS | 3 refills | Status: AC

## 2024-02-10 ENCOUNTER — Other Ambulatory Visit: Payer: Self-pay | Admitting: Internal Medicine

## 2024-02-10 LAB — URINE CULTURE

## 2024-02-10 MED ORDER — CIPROFLOXACIN HCL 500 MG PO TABS: 500.0000 mg | ORAL_TABLET | Freq: Two times a day (BID) | ORAL | 0 refills | Status: AC

## 2024-02-12 MED ORDER — AMOXICILLIN-POT CLAVULANATE 875-125 MG PO TABS: 1.0000 | ORAL_TABLET | Freq: Two times a day (BID) | ORAL | 0 refills | Status: AC

## 2024-02-12 NOTE — Telephone Encounter (Signed)
 Ok sure this can be changed to augmentin  - I will send to pharmacy now, thanks

## 2024-02-19 LAB — OPHTHALMOLOGY REPORT-SCANNED
